# Patient Record
Sex: Female | Born: 1960 | Race: Black or African American | Hispanic: No | Marital: Married | Smoking: Never smoker
Health system: Southern US, Community
[De-identification: ages and names within clinical notes are randomized; demographics above are authoritative.]

## PROBLEM LIST (undated history)

## (undated) DIAGNOSIS — K5790 Diverticulosis of intestine, part unspecified, without perforation or abscess without bleeding: Secondary | ICD-10-CM

## (undated) DIAGNOSIS — M1990 Unspecified osteoarthritis, unspecified site: Secondary | ICD-10-CM

## (undated) DIAGNOSIS — K5792 Diverticulitis of intestine, part unspecified, without perforation or abscess without bleeding: Secondary | ICD-10-CM

## (undated) DIAGNOSIS — E282 Polycystic ovarian syndrome: Secondary | ICD-10-CM

## (undated) DIAGNOSIS — R7303 Prediabetes: Secondary | ICD-10-CM

## (undated) DIAGNOSIS — D259 Leiomyoma of uterus, unspecified: Secondary | ICD-10-CM

## (undated) DIAGNOSIS — E119 Type 2 diabetes mellitus without complications: Secondary | ICD-10-CM

## (undated) DIAGNOSIS — Z8719 Personal history of other diseases of the digestive system: Secondary | ICD-10-CM

## (undated) DIAGNOSIS — E039 Hypothyroidism, unspecified: Secondary | ICD-10-CM

## (undated) DIAGNOSIS — E079 Disorder of thyroid, unspecified: Secondary | ICD-10-CM

## (undated) DIAGNOSIS — K5909 Other constipation: Secondary | ICD-10-CM

## (undated) DIAGNOSIS — I10 Essential (primary) hypertension: Secondary | ICD-10-CM

## (undated) DIAGNOSIS — E785 Hyperlipidemia, unspecified: Secondary | ICD-10-CM

## (undated) HISTORY — DX: Unspecified osteoarthritis, unspecified site: M19.90

## (undated) HISTORY — DX: Leiomyoma of uterus, unspecified: D25.9

## (undated) HISTORY — DX: Personal history of other diseases of the digestive system: Z87.19

## (undated) HISTORY — DX: Prediabetes: R73.03

## (undated) HISTORY — PX: COLONOSCOPY: SHX174

## (undated) HISTORY — DX: Hypothyroidism, unspecified: E03.9

## (undated) HISTORY — DX: Disorder of thyroid, unspecified: E07.9

## (undated) HISTORY — PX: REFRACTIVE SURGERY: SHX103

## (undated) HISTORY — DX: Diverticulosis of intestine, part unspecified, without perforation or abscess without bleeding: K57.90

## (undated) HISTORY — PX: EYE SURGERY: SHX253

## (undated) HISTORY — DX: Other constipation: K59.09

## (undated) HISTORY — DX: Essential (primary) hypertension: I10

## (undated) HISTORY — PX: LASIK: SHX215

## (undated) HISTORY — DX: Diverticulitis of intestine, part unspecified, without perforation or abscess without bleeding: K57.92

## (undated) HISTORY — PX: BREAST SURGERY: SHX581

## (undated) HISTORY — PX: TONSILLECTOMY AND ADENOIDECTOMY: SUR1326

## (undated) HISTORY — DX: Type 2 diabetes mellitus without complications: E11.9

## (undated) HISTORY — PX: REDUCTION MAMMAPLASTY: SUR839

## (undated) HISTORY — DX: Hyperlipidemia, unspecified: E78.5

## (undated) HISTORY — DX: Polycystic ovarian syndrome: E28.2

---

## 1986-04-28 HISTORY — PX: BREAST REDUCTION SURGERY: SHX8

## 1993-04-28 HISTORY — PX: CHOLECYSTECTOMY: SHX55

## 1999-10-25 ENCOUNTER — Other Ambulatory Visit: Admission: RE | Admit: 1999-10-25 | Discharge: 1999-10-25 | Payer: Self-pay | Admitting: Obstetrics and Gynecology

## 2000-11-03 ENCOUNTER — Other Ambulatory Visit: Admission: RE | Admit: 2000-11-03 | Discharge: 2000-11-03 | Payer: Self-pay | Admitting: Obstetrics and Gynecology

## 2000-12-11 ENCOUNTER — Ambulatory Visit (HOSPITAL_COMMUNITY): Admission: RE | Admit: 2000-12-11 | Discharge: 2000-12-11 | Payer: Self-pay | Admitting: Obstetrics and Gynecology

## 2000-12-11 ENCOUNTER — Encounter: Payer: Self-pay | Admitting: Obstetrics and Gynecology

## 2001-11-24 ENCOUNTER — Other Ambulatory Visit: Admission: RE | Admit: 2001-11-24 | Discharge: 2001-11-24 | Payer: Self-pay | Admitting: Obstetrics and Gynecology

## 2002-02-18 ENCOUNTER — Encounter (INDEPENDENT_AMBULATORY_CARE_PROVIDER_SITE_OTHER): Payer: Self-pay

## 2002-02-18 ENCOUNTER — Ambulatory Visit (HOSPITAL_COMMUNITY): Admission: RE | Admit: 2002-02-18 | Discharge: 2002-02-18 | Payer: Self-pay | Admitting: Obstetrics and Gynecology

## 2003-04-12 ENCOUNTER — Other Ambulatory Visit: Admission: RE | Admit: 2003-04-12 | Discharge: 2003-04-12 | Payer: Self-pay | Admitting: Obstetrics and Gynecology

## 2004-04-11 ENCOUNTER — Encounter: Admission: RE | Admit: 2004-04-11 | Discharge: 2004-04-11 | Payer: Self-pay | Admitting: Obstetrics and Gynecology

## 2004-07-05 ENCOUNTER — Ambulatory Visit (HOSPITAL_COMMUNITY): Admission: RE | Admit: 2004-07-05 | Discharge: 2004-07-05 | Payer: Self-pay | Admitting: Internal Medicine

## 2005-08-21 ENCOUNTER — Encounter: Admission: RE | Admit: 2005-08-21 | Discharge: 2005-08-21 | Payer: Self-pay | Admitting: Obstetrics and Gynecology

## 2006-04-15 ENCOUNTER — Ambulatory Visit: Payer: Self-pay

## 2006-08-24 ENCOUNTER — Encounter: Admission: RE | Admit: 2006-08-24 | Discharge: 2006-08-24 | Payer: Self-pay | Admitting: Obstetrics and Gynecology

## 2007-08-17 ENCOUNTER — Ambulatory Visit (HOSPITAL_COMMUNITY): Admission: RE | Admit: 2007-08-17 | Discharge: 2007-08-17 | Payer: Self-pay | Admitting: General Surgery

## 2007-09-22 ENCOUNTER — Encounter: Admission: RE | Admit: 2007-09-22 | Discharge: 2007-09-22 | Payer: Self-pay | Admitting: Obstetrics and Gynecology

## 2007-09-28 ENCOUNTER — Encounter: Admission: RE | Admit: 2007-09-28 | Discharge: 2007-09-28 | Payer: Self-pay | Admitting: General Surgery

## 2008-09-22 ENCOUNTER — Encounter: Admission: RE | Admit: 2008-09-22 | Discharge: 2008-09-22 | Payer: Self-pay | Admitting: Obstetrics and Gynecology

## 2010-01-31 ENCOUNTER — Encounter: Admission: RE | Admit: 2010-01-31 | Discharge: 2010-01-31 | Payer: Self-pay | Admitting: Obstetrics and Gynecology

## 2010-09-13 NOTE — Op Note (Signed)
   NAME:  Kelly Goodwin, Kelly Goodwin                          ACCOUNT NO.:  [id number]   MEDICAL RECORD NO.:  [id number]                   PATIENT TYPE:  AMB   LOCATION:  SDC                                  FACILITY:  [facility]   PHYSICIAN:  Maxie Better, MD              DATE OF BIRTH:  [birth year]   DATE OF PROCEDURE:  02/18/2002  DATE OF DISCHARGE:                                 OPERATIVE REPORT   PREOPERATIVE DIAGNOSES:  Missed abortion with adjacent cystic mass.   PROCEDURE:  Suction dilatation and evacuation.   POSTOPERATIVE DIAGNOSES:  Missed abortion with adjacent cystic mass.   ANESTHESIA:  MAC, paracervical block.   SURGEON:  Maxie Better, MD   INDICATIONS:  This is Goodwin 50 year old gravida 2, para 0 female at 7-1/[redacted] weeks  gestation who was found on ultrasound yesterday to have Goodwin fetal demise with  an associated cystic mass adjacent to the fetus of unclear etiology.  Risks  and benefits of the procedure planned today was reviewed with the patient.  Consent was signed.  The patient was transferred to the operating room.   PROCEDURE:  Under adequate monitored anesthesia, the patient was placed in  the dorsal lithotomy position.  She was thoroughly prepped and draped in the  usual fashion.  The bladder was catheterized for Goodwin small amount of urine.  Examination under anesthesia revealed an 8+ week sized uterus anteflexed  with no adnexal masses palpable.  Goodwin bivalve speculum was placed in the  vagina.  20 cc of 1% Nesacaine was injected paracervically at 3 and 9  o'clock.  The anterior lip of the cervix was subsequently grasped with Goodwin  single tooth tenaculum.  The cervix was serially dilated up to number 31  Pratt dilator and Goodwin number 9 mm suction curved cannula was introduced into  the uterine cavity without incident.  The cavity was suctioned and  subsequently curetted and then suctioned again.  Goodwin lot of tissue was felt to  have been removed.  All instruments were then removed from  the vagina.  Goodwin  portion of the specimen was sent for karyotyping.   SPECIMENS:  Specimen labeled products of conception was sent to pathology.   ESTIMATED BLOOD LOSS:  Minimal.   COMPLICATIONS:  None.   DISPOSITION:  The patient tolerated the procedure well.  Was transferred to  recovery room in stable condition.                                               Maxie Better, MD    [place]/MEDQ  D:  02/18/2002  T:  02/18/2002  Job:  [id number]

## 2011-01-21 ENCOUNTER — Ambulatory Visit (HOSPITAL_COMMUNITY)
Admission: RE | Admit: 2011-01-21 | Discharge: 2011-01-21 | Disposition: A | Discharge status: Home / Self Care | Payer: 59 | Source: Ambulatory Visit | Attending: Internal Medicine | Admitting: Internal Medicine

## 2011-01-21 ENCOUNTER — Other Ambulatory Visit (HOSPITAL_COMMUNITY): Payer: Self-pay | Admitting: Internal Medicine

## 2011-01-21 DIAGNOSIS — M545 Low back pain, unspecified: Secondary | ICD-10-CM | POA: Insufficient documentation

## 2011-01-21 DIAGNOSIS — M25559 Pain in unspecified hip: Secondary | ICD-10-CM | POA: Insufficient documentation

## 2011-03-03 ENCOUNTER — Other Ambulatory Visit: Payer: Self-pay | Admitting: Obstetrics and Gynecology

## 2011-03-03 DIAGNOSIS — Z1231 Encounter for screening mammogram for malignant neoplasm of breast: Secondary | ICD-10-CM

## 2011-03-07 ENCOUNTER — Ambulatory Visit
Admission: RE | Admit: 2011-03-07 | Discharge: 2011-03-07 | Disposition: A | Discharge status: Home / Self Care | Payer: 59 | Source: Ambulatory Visit | Attending: Obstetrics and Gynecology | Admitting: Obstetrics and Gynecology

## 2011-03-07 DIAGNOSIS — Z1231 Encounter for screening mammogram for malignant neoplasm of breast: Secondary | ICD-10-CM

## 2011-03-24 ENCOUNTER — Encounter: Payer: Self-pay | Admitting: Gastroenterology

## 2011-03-24 ENCOUNTER — Ambulatory Visit: Payer: 59

## 2011-03-25 ENCOUNTER — Ambulatory Visit (AMBULATORY_SURGERY_CENTER): Payer: 59 | Admitting: *Deleted

## 2011-03-25 ENCOUNTER — Telehealth: Payer: Self-pay | Admitting: *Deleted

## 2011-03-25 VITALS — Ht 60.0 in | Wt 173.0 lb

## 2011-03-25 DIAGNOSIS — Z1211 Encounter for screening for malignant neoplasm of colon: Secondary | ICD-10-CM

## 2011-03-25 MED ORDER — PEG-KCL-NACL-NASULF-NA ASC-C 100 G PO SOLR: ORAL | Status: DC

## 2011-03-25 NOTE — Telephone Encounter (Signed)
Your plan is excellent, 2 days of clear liquids plus Mag citrate on Day#1

## 2011-03-25 NOTE — Telephone Encounter (Signed)
Left message on pts. Home number to be sure to take the mag citrate on day one of clear liquids as instructed/Dr. Juanda Chance.

## 2011-03-25 NOTE — Telephone Encounter (Signed)
Dr. Juanda Chance, Ms. Register has chronic constipation and says she uses Mag Citrate to have BM's and that sometimes even a bottle of that doesn't work.  She is scheduled for a colon on 04/03/11.   I gave her a 2 day clear liquid prep and the Moviprep and advised her to take a bottle of mag citrate on day 1 of the clear liquids.  Please advise if I need to add to her prep.  Thank you. Wyona Almas

## 2011-04-03 ENCOUNTER — Encounter: Payer: 59 | Admitting: Internal Medicine

## 2011-04-14 ENCOUNTER — Encounter: Payer: 59 | Admitting: Gastroenterology

## 2011-04-16 ENCOUNTER — Ambulatory Visit (AMBULATORY_SURGERY_CENTER): Payer: 59 | Admitting: Internal Medicine

## 2011-04-16 ENCOUNTER — Encounter: Payer: Self-pay | Admitting: Internal Medicine

## 2011-04-16 VITALS — BP 139/72 | HR 81 | Temp 98.4°F | Resp 16 | Ht 60.0 in | Wt 173.0 lb

## 2011-04-16 DIAGNOSIS — Z1211 Encounter for screening for malignant neoplasm of colon: Secondary | ICD-10-CM

## 2011-04-16 MED ORDER — SODIUM CHLORIDE 0.9 % IV SOLN: 500.0000 mL | INTRAVENOUS | Status: DC

## 2011-04-16 NOTE — Patient Instructions (Signed)
Please refer to your blue and neon green sheets for instructions regarding diet and activity for the rest of today.  You may resume your medications as you would normally take them.   Diverticulosis Diverticulosis is a common condition that develops when small pouches (diverticula) form in the wall of the colon. The risk of diverticulosis increases with age. It happens more often in people who eat a low-fiber diet. Most individuals with diverticulosis have no symptoms. Those individuals with symptoms usually experience abdominal pain, constipation, or loose stools (diarrhea). HOME CARE INSTRUCTIONS   Increase the amount of fiber in your diet as directed by your caregiver or dietician. This may reduce symptoms of diverticulosis.   Your caregiver may recommend taking a dietary fiber supplement.   Drink at least 6 to 8 glasses of water each day to prevent constipation.   Try not to strain when you have a bowel movement.   Your caregiver may recommend avoiding nuts and seeds to prevent complications, although this is still an uncertain benefit.   Only take over-the-counter or prescription medicines for pain, discomfort, or fever as directed by your caregiver.  FOODS WITH HIGH FIBER CONTENT INCLUDE:  Fruits. Apple, peach, pear, tangerine, raisins, prunes.   Vegetables. Brussels sprouts, asparagus, broccoli, cabbage, carrot, cauliflower, romaine lettuce, spinach, summer squash, tomato, winter squash, zucchini.   Starchy Vegetables. Baked beans, kidney beans, lima beans, split peas, lentils, potatoes (with skin).   Grains. Whole wheat bread, brown rice, bran flake cereal, plain oatmeal, white rice, shredded wheat, bran muffins.  SEEK IMMEDIATE MEDICAL CARE IF:   You develop increasing pain or severe bloating.   You have an oral temperature above 102 F (38.9 C), not controlled by medicine.   You develop vomiting or bowel movements that are bloody or black.  Document Released: 01/10/2004  Document Revised: 12/25/2010 Document Reviewed: 09/12/2009 [employer] Patient Information 2012 [employer]. 

## 2011-04-16 NOTE — Progress Notes (Signed)
Patient did not experience any of the following events: a burn prior to discharge; a fall within the facility; wrong site/side/patient/procedure/implant event; or a hospital transfer or hospital admission upon discharge from the facility. (G8907) Patient did not have preoperative order for IV antibiotic SSI prophylaxis. (G8918)  

## 2011-04-16 NOTE — Op Note (Signed)
[facility] [address] [place], [place]  [address]  COLONOSCOPY PROCEDURE REPORT  PATIENT:  Kelly, Goodwin  MR#:  [id number] BIRTHDATE:  Feb 02, 1961, 50 yrs. old  GENDER:  female ENDOSCOPIST:  Hedwig Morton. Juanda Chance, MD REF. BY:  Marisue Brooklyn, D.O. PROCEDURE DATE:  04/16/2011 PROCEDURE:  Colonoscopy 19147 ASA CLASS:  Class I INDICATIONS:  colorectal cancer screening, average risk MEDICATIONS:   MAC sedation, administered by CRNA, propofol (Diprivan) 320 mcg  DESCRIPTION OF PROCEDURE:   After the risks and benefits and of the procedure were explained, informed consent was obtained. Digital rectal exam was performed and revealed no rectal masses. The LB [id number]-[id number] endoscope was introduced through the anus and advanced to the cecum, which was identified by both the appendix and ileocecal valve.  The quality of the prep was excellent, using MoviPrep.  The instrument was then slowly withdrawn as the colon was fully examined. <<PROCEDUREIMAGES>>  FINDINGS:  Mild diverticulosis was found in the sigmoid colon (see image1).  This was otherwise a normal examination of the colon (see image2, image3, image4, image5, image6, image7, and image8). Retroflexed views in the rectum revealed no abnormalities.    The scope was then withdrawn from the patient and the procedure completed.  COMPLICATIONS:  None ENDOSCOPIC IMPRESSION: 1) Mild diverticulosis in the sigmoid colon 2) Otherwise normal examination RECOMMENDATIONS: 1) High fiber diet.  REPEAT EXAM:  In 10 year(s) for.  ______________________________ Hedwig Morton. Juanda Chance, MD  CC:  n. eSIGNED:   Hedwig Morton. Yenni Carra at 04/16/2011 08:39 AM  Learta Codding, [phone]

## 2011-04-17 ENCOUNTER — Telehealth: Payer: Self-pay | Admitting: *Deleted

## 2011-04-17 NOTE — Telephone Encounter (Signed)
Left message, no answer.

## 2011-06-04 ENCOUNTER — Telehealth: Payer: Self-pay | Admitting: Internal Medicine

## 2011-06-04 NOTE — Telephone Encounter (Signed)
I agree with pt seeing Kelly Goodwin. 

## 2011-06-04 NOTE — Telephone Encounter (Signed)
Patient calling to report on 05/25/11, she went to [facility] in [facility] for abdominal cramping. They sent her to ED in [place]. She was given IVF, had a CT scan and was told it was diverticulitis. She was sent home with antibiotics and pain medication. She was told to f/u with her GI in 3 weeks. Today is her last day of antibiotics and she is having some abdominal pain again. States it is mild but it concerns her. She will get the records from [place] ED faxed to [place] or pick them  Up herself. Scheduled her with Mike Gip, PA on 06/05/11 at 2:00 PM. Last colon 04/16/11- mild diverticulosis in sigmoid colon.

## 2011-06-05 ENCOUNTER — Encounter: Payer: Self-pay | Admitting: Physician Assistant

## 2011-06-05 ENCOUNTER — Ambulatory Visit (INDEPENDENT_AMBULATORY_CARE_PROVIDER_SITE_OTHER): Payer: 59 | Admitting: Physician Assistant

## 2011-06-05 VITALS — BP 150/80 | HR 75 | Ht 60.0 in

## 2011-06-05 DIAGNOSIS — K5730 Diverticulosis of large intestine without perforation or abscess without bleeding: Secondary | ICD-10-CM

## 2011-06-05 DIAGNOSIS — E119 Type 2 diabetes mellitus without complications: Secondary | ICD-10-CM

## 2011-06-05 DIAGNOSIS — K5732 Diverticulitis of large intestine without perforation or abscess without bleeding: Secondary | ICD-10-CM

## 2011-06-05 DIAGNOSIS — I10 Essential (primary) hypertension: Secondary | ICD-10-CM

## 2011-06-05 DIAGNOSIS — E039 Hypothyroidism, unspecified: Secondary | ICD-10-CM

## 2011-06-05 DIAGNOSIS — K5792 Diverticulitis of intestine, part unspecified, without perforation or abscess without bleeding: Secondary | ICD-10-CM

## 2011-06-05 DIAGNOSIS — K5790 Diverticulosis of intestine, part unspecified, without perforation or abscess without bleeding: Secondary | ICD-10-CM | POA: Insufficient documentation

## 2011-06-05 MED ORDER — CIPROFLOXACIN HCL 500 MG PO TABS: 500.0000 mg | ORAL_TABLET | Freq: Two times a day (BID) | ORAL | Status: AC

## 2011-06-05 MED ORDER — FLUCONAZOLE 100 MG PO TABS: 100.0000 mg | ORAL_TABLET | Freq: Once | ORAL | Status: AC

## 2011-06-05 MED ORDER — METRONIDAZOLE 500 MG PO TABS: 500.0000 mg | ORAL_TABLET | Freq: Two times a day (BID) | ORAL | Status: AC

## 2011-06-05 NOTE — Patient Instructions (Addendum)
We have sent 7 more days of the Cipro and Flagyl. We also sent prescription and refill for Diflucan. Call back if the pain is not resolved.

## 2011-06-05 NOTE — Progress Notes (Signed)
Subjective:    Patient ID: Kelly Goodwin, female    DOB: 1960/07/16, 51 y.o.   MRN: [id number]  HPI Kelly Goodwin is a very nice 51 year old female recently known to Dr. Lina Sar from screening colonoscopy done in December of 2012. She was found to have mild diverticulosis in the sigmoid colon and otherwise a normal exam. Patient says she developed left lower quadrant abdominal pain towards the end of January,which progressed over a few days. She was eventually seen at an urgent care and then had CT scan of the abdomen and pelvis done through [facility] emergency room. She brought  a copy of that with her today and the CT scan shows acute descending and sigmoid colon diverticulitis without perforation or abscess. Labs at that time showed a WBC of 14.6, hemoglobin 12.4, hematocrit 38.4.  She was treated with a 7 day course of Flagyl and a 10 day course of Cipro 500 twice daily. She says she feels a lot better than she did however after she completed her antibiotics yesterday she was still having just very mild persistent left lower quadrant pain and therefore made the appointment here. She has not had any fever or chills no nausea or vomiting. Her bowel movements are regular though she says her stools are somewhat loose she has not noted any blood. She says she just has a dull persistent left lower quadrant discomfort.  , She is concerned about the diet because she was eating a lot of popcorn recently trying to lose weight and wonders if that is okay  Review of Systems  HENT: Negative.   Respiratory: Negative.   Cardiovascular: Negative.   Gastrointestinal: Positive for abdominal pain.  Genitourinary: Negative.   Musculoskeletal: Negative.   Neurological: Negative.   Hematological: Negative.   Psychiatric/Behavioral: Negative.    Outpatient Prescriptions Prior to Visit  Medication Sig Dispense Refill  . BIOTIN PO Take 1 tablet by mouth at bedtime.        Marland Kitchen BLACK COHOSH PO Take 1 tablet by mouth  daily.        . Cholecalciferol (VITAMIN D) 2000 UNITS tablet Take 2,000 Units by mouth 2 (two) times daily.        Marland Kitchen GARLIC PO Take 1 tablet by mouth 2 (two) times daily.        Marland Kitchen levothyroxine (SYNTHROID, LEVOTHROID) 100 MCG tablet Take 100 mcg by mouth daily.        Marland Kitchen losartan-hydrochlorothiazide (HYZAAR) 100-25 MG per tablet Take 1 tablet by mouth daily.        Marland Kitchen LYSINE PO Take 1 tablet by mouth at bedtime.        . metFORMIN (GLUCOPHAGE) 1000 MG tablet Take 1,000 mg by mouth 2 (two) times daily with a meal.        . Multiple Vitamins-Minerals (MULTIVITAMIN WITH MINERALS) tablet Take 1 tablet by mouth daily.        . temazepam (RESTORIL) 30 MG capsule Take 30 mg by mouth at bedtime as needed.        . valACYclovir (VALTREX) 500 MG tablet Take 500 mg by mouth 2 (two) times daily as needed.        . cloNIDine HCl (KAPVAY) 0.1 MG TB12 ER tablet Take 0.1 mg by mouth 2 (two) times daily.        Marland Kitchen EVENING PRIMROSE OIL PO Take 1 tablet by mouth at bedtime.       Marland Kitchen lubiprostone (AMITIZA) 8 MCG capsule Take 8 mcg by mouth  2 (two) times daily with a meal.        . peg 3350 powder (MOVIPREP) 100 G SOLR MOVI PREP take as directed  1 kit  0   No Known Allergies     Active Ambulatory Problems    Diagnosis Date Noted  . Diverticulosis 06/05/2011  . Diverticulitis large intestine 06/05/2011  . Hypothyroid 06/05/2011  . HTN (hypertension) 06/05/2011  . Diabetes mellitus 06/05/2011   Resolved Ambulatory Problems    Diagnosis Date Noted  . No Resolved Ambulatory Problems   Past Medical History  Diagnosis Date  . Arthritis   . Constipation, chronic   . PCOS (polycystic ovarian syndrome)   . Hypertension   . Thyroid disease   . Fibroid uterus   . Diverticulitis   . Migraine   . History of gallstones    Objective:   Physical Exam well-developed white female in no acute distress, pleasant alert and oriented x3 blood pressure 150/80, pulse 75. HEENT;; nonicteric normocephalic EOMI PERRLA  sclera anicteric,Neck; Supple no JVD, Cardiovascular; regular rate and rhythm with S1-S2 no murmur gallop, Pulmonary; clear bilaterally, Abdomen soft ,,nondistended bowel sounds are active, she is mildly tender in the left lower quadrant there is no guarding no rebound no palpable mass or hepatosplenomegaly, Rectal; not done, Extremities;,. no clubbing cyanosis or edema, Psych; mood and affect normal and appropriate        Assessment & Plan:  #43 51 year old female with first episode of sigmoid diverticulitis, uncomplicated-with mild persistent left lower quadrant discomfort after a 10 day course of antibiotics. I suspect she has not completely resolved her diverticulitis.  Plan; Will treat her with another seven-day course of Cipro 500 mg by mouth twice daily, and Flagyl 500 mg by mouth twice daily. Diflucan 100 mg as needed for vaginal candidiasis. Discussed role of diet in diverticular disease and advised she  avoid popcorn and nuts for now. She is asked to call if she has any residual pain after she completes the additional course of antibiotics otherwise will follow up with Dr. Lina Sar  on an as-needed basis.

## 2011-06-05 NOTE — Progress Notes (Signed)
Agree with initial assessment and plans as outlined 

## 2011-11-28 ENCOUNTER — Other Ambulatory Visit (HOSPITAL_COMMUNITY): Payer: Self-pay | Admitting: Physician Assistant

## 2011-11-28 ENCOUNTER — Ambulatory Visit (HOSPITAL_COMMUNITY)
Admission: RE | Admit: 2011-11-28 | Discharge: 2011-11-28 | Disposition: A | Discharge status: Home / Self Care | Payer: 59 | Source: Ambulatory Visit | Attending: Physician Assistant | Admitting: Physician Assistant

## 2011-11-28 DIAGNOSIS — R072 Precordial pain: Secondary | ICD-10-CM

## 2011-11-28 DIAGNOSIS — R0602 Shortness of breath: Secondary | ICD-10-CM | POA: Insufficient documentation

## 2011-11-28 DIAGNOSIS — M549 Dorsalgia, unspecified: Secondary | ICD-10-CM | POA: Insufficient documentation

## 2012-01-22 ENCOUNTER — Other Ambulatory Visit: Payer: Self-pay | Admitting: Internal Medicine

## 2012-01-22 DIAGNOSIS — R1310 Dysphagia, unspecified: Secondary | ICD-10-CM

## 2012-01-23 ENCOUNTER — Ambulatory Visit
Admission: RE | Admit: 2012-01-23 | Discharge: 2012-01-23 | Disposition: A | Discharge status: Home / Self Care | Payer: 59 | Source: Ambulatory Visit | Attending: Internal Medicine | Admitting: Internal Medicine

## 2012-01-23 DIAGNOSIS — R1310 Dysphagia, unspecified: Secondary | ICD-10-CM

## 2012-01-23 MED ORDER — IOHEXOL 300 MG/ML  SOLN
75.0000 mL | Freq: Once | INTRAMUSCULAR | Status: AC | PRN
  Administered 2012-01-23: 75 mL via INTRAVENOUS

## 2012-02-09 ENCOUNTER — Encounter: Payer: Self-pay | Admitting: Physician Assistant

## 2012-02-09 ENCOUNTER — Telehealth: Payer: Self-pay | Admitting: Physician Assistant

## 2012-02-09 ENCOUNTER — Telehealth: Payer: Self-pay | Admitting: Internal Medicine

## 2012-02-09 ENCOUNTER — Ambulatory Visit (INDEPENDENT_AMBULATORY_CARE_PROVIDER_SITE_OTHER): Payer: 59 | Admitting: Physician Assistant

## 2012-02-09 VITALS — BP 150/80 | HR 96 | Ht 61.0 in | Wt 175.4 lb

## 2012-02-09 DIAGNOSIS — K5792 Diverticulitis of intestine, part unspecified, without perforation or abscess without bleeding: Secondary | ICD-10-CM

## 2012-02-09 DIAGNOSIS — K5909 Other constipation: Secondary | ICD-10-CM

## 2012-02-09 DIAGNOSIS — K5900 Constipation, unspecified: Secondary | ICD-10-CM

## 2012-02-09 DIAGNOSIS — K5732 Diverticulitis of large intestine without perforation or abscess without bleeding: Secondary | ICD-10-CM

## 2012-02-09 MED ORDER — TRAMADOL HCL 50 MG PO TABS: 50.0000 mg | ORAL_TABLET | Freq: Four times a day (QID) | ORAL | Status: DC | PRN

## 2012-02-09 MED ORDER — LINACLOTIDE 145 MCG PO CAPS: 145.0000 ug | ORAL_CAPSULE | Freq: Every day | ORAL | Status: DC

## 2012-02-09 MED ORDER — AMOXICILLIN-POT CLAVULANATE 875-125 MG PO TABS: 1.0000 | ORAL_TABLET | Freq: Two times a day (BID) | ORAL | Status: DC

## 2012-02-09 NOTE — Progress Notes (Signed)
Reviewed and agree.

## 2012-02-09 NOTE — Patient Instructions (Addendum)
We have sent the following medications to your pharmacy for you to pick up at your convenience: Augmentin and Ultram   We have given you samples of the following medication to take: Lizness. Please take one by mouth once daily. If this medication works for you please call back and we can send in a prescription

## 2012-02-09 NOTE — Telephone Encounter (Signed)
Patient states she gets yeast infections with antibiotics and is asking for rx with one refill on it for this. Please, advise.

## 2012-02-09 NOTE — Progress Notes (Signed)
Subjective:    Patient ID: Kelly Goodwin, female    DOB: [birth year], 51 y.o.   MRN: [id number]  HPI Kelly Goodwin is a pleasant 51 year old female known to Dr. Juanda Chance with history of diverticular disease. She underwent colonoscopy in December of 2012 which did show mild diverticulosis in the sigmoid colon and otherwise was a negative exam. She was seen here in February of 2013 with left lower quadrant pain and was treated for diverticulitis. This was her first episode. He says she's been doing fine in the interim from a GI standpoint with the exception of her chronic constipation which she has had for years area she has tried multiple different laxatives and at this point is using a fiber supplement plus MiraLax every evening) use and still only has a bowel movement about once a week. She had onset about 8 days ago with lower abdominal discomfort and bloating- however she felt better the following day and then this past weekend developed left lower cautery pain which has now been persistent over the past 24 hours. She says it feels like her diverticulitis did previously. She's not had any fever or chills, no nausea or vomiting, no melena or hematochezia. She is concerned because she supposed to go out of town later this week for a short vacation    Review of Systems  Constitutional: Positive for appetite change.  HENT: Negative.   Eyes: Negative.   Respiratory: Positive for cough.   Cardiovascular: Negative.   Gastrointestinal: Positive for abdominal pain and constipation.  Genitourinary: Negative.   Musculoskeletal: Negative.   Skin: Negative.   Neurological: Negative.   Hematological: Negative.   Psychiatric/Behavioral: Negative.    Outpatient Prescriptions Prior to Visit  Medication Sig Dispense Refill  . BIOTIN PO Take 1 tablet by mouth at bedtime.        Marland Kitchen BLACK COHOSH PO Take 1 tablet by mouth daily.        . Cholecalciferol (VITAMIN D) 2000 UNITS tablet Take 2,000 Units by mouth 2 (two)  times daily.        Marland Kitchen GARLIC PO Take 1 tablet by mouth 2 (two) times daily.        Marland Kitchen levothyroxine (SYNTHROID, LEVOTHROID) 100 MCG tablet Take 100 mcg by mouth daily.        Marland Kitchen losartan-hydrochlorothiazide (HYZAAR) 100-25 MG per tablet Take 1 tablet by mouth daily.        Marland Kitchen LYSINE PO Take 1 tablet by mouth at bedtime.        . metFORMIN (GLUCOPHAGE) 1000 MG tablet Take 1,000 mg by mouth 2 (two) times daily with a meal.        . Multiple Vitamins-Minerals (MULTIVITAMIN WITH MINERALS) tablet Take 1 tablet by mouth daily.        . temazepam (RESTORIL) 30 MG capsule Take 30 mg by mouth at bedtime as needed.        . valACYclovir (VALTREX) 500 MG tablet Take 500 mg by mouth 2 (two) times daily as needed.         No Known Allergies Patient Active Problem List  Diagnosis  . Diverticulosis  . Diverticulitis large intestine  . Hypothyroid  . HTN (hypertension)  . Diabetes mellitus   History   Social History  . Marital Status: Married    Spouse Name: N/A    Number of Children: 0  . Years of Education: N/A   Occupational History  . 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  Smoking status: Never Smoker   . Smokeless tobacco: Never Used  . Alcohol Use: Yes     rarely  . Drug Use: No  . Sexually Active: Not on file   Other Topics Concern  . Not on file   Social History Narrative   Caffeine use 2 a week       Objective:   Physical Exam well-developed African American female in no acute distress, pleasant. Blood pressure 150/80 pulse 96 height 5 foot 1 weight 175. HEENT; nontraumatic normocephalic EOMI PERRLA sclera anicteric,Neck; Supple no JVD, Cardiovascular; regular rate and rhythm with S1-S2 no murmur or gallop, Pulmonary; clear bilaterally, Abdomen; soft bowel sounds are present she is tender in the left lower quadrant, no guarding or rebound no palpable mass or hepatosplenomegaly, Rectal; exam not done, Extremities; no clubbing cyanosis or edema skin warm and dry,  Psych; mood and affect normal and appropriate        Assessment & Plan:  #13  51 year old female with recurrent sigmoid diverticulitis #2 chronic constipation  Plan; start Augmentin 875 mg by mouth twice daily x14 days Ultram 50 mg one every 4-6 hours as needed for pain over the next few days #20 no refills Will also give her a trial of Linzess 145 mcg one by mouth daily or chronic constipation, samples were given today and if she finds these helpful she will call back for a prescription. She had tried Amitiza in the past but developed alopecia Patient cautioned to call back if she is not significantly per improved within the next 72 hours or at any point if her symptoms worsen. I also asked her to call if she has a residual discomfort after she finishes the antibiotics. Follow up with Dr. Juanda Chance as needed

## 2012-02-09 NOTE — Telephone Encounter (Signed)
Patient states she is having sharp abdominal pain and cramping that started this weekend. States her stomach is hard and she feels bloated. Hx diverticulitis and patient states it is very similar to this type of pain. Scheduled her at 9:30 AM with Mike Gip, PA

## 2012-02-09 NOTE — Progress Notes (Signed)
Agree with Ms. Esterwood's assessment and plan. Artha Stavros E. Zoya Sprecher, MD, FACG   

## 2012-02-10 MED ORDER — FLUCONAZOLE 150 MG PO TABS: 150.0000 mg | ORAL_TABLET | Freq: Once | ORAL | Status: DC

## 2012-02-10 NOTE — Telephone Encounter (Signed)
Spoke with patient and she has taken Diflucan in the past without problems. Rx sent.

## 2012-02-10 NOTE — Telephone Encounter (Signed)
If she has taken Diflucan before and tolerated- can give her Rx for Diflucan 150 mg   One dose-  -dispense #2  One refill

## 2012-02-19 ENCOUNTER — Telehealth: Payer: Self-pay | Admitting: Internal Medicine

## 2012-02-20 MED ORDER — LINACLOTIDE 145 MCG PO CAPS: 145.0000 ug | ORAL_CAPSULE | Freq: Every day | ORAL | Status: DC

## 2012-02-20 NOTE — Telephone Encounter (Signed)
Per Gunnar Fusi patient needs to start taking antibiotic twice daily  Linzess called into patients pharmacy   Left message on patients voice mail to call office back

## 2012-02-23 ENCOUNTER — Other Ambulatory Visit: Payer: Self-pay | Admitting: Obstetrics and Gynecology

## 2012-02-23 DIAGNOSIS — Z1231 Encounter for screening mammogram for malignant neoplasm of breast: Secondary | ICD-10-CM

## 2012-02-24 ENCOUNTER — Telehealth: Payer: Self-pay | Admitting: *Deleted

## 2012-02-24 NOTE — Telephone Encounter (Signed)
Patient did try Amitiza did not work.  Patient really liked Linzess but not covered by insurance. I advised patient that we will do a prior authorization to see if she can get approved for Linzess.  Kelly Goodwin will look over her chart as well.  Told patient I will leave samples of Linzess up front to help her through until we can get prior authorization or find an alternative

## 2012-02-24 NOTE — Telephone Encounter (Signed)
Called [facility] at [phone] and spoke with Herbert Seta. Patient was approved for Linzess until August 14, 2012. Authorization code is [id number] Faxed letter back to [facility] that it was approved and approval code I called patient and advised that [name] was approved and available for pick up.. Patient verbalized understanding

## 2012-02-24 NOTE — Telephone Encounter (Signed)
Agree,  Laxatives in the pastwould try to get pre auth for Linzess as she has been on multiple laxatives in the past, and could not tolerate Amitiza due to alopecia.

## 2012-03-17 ENCOUNTER — Telehealth: Payer: Self-pay | Admitting: Physician Assistant

## 2012-03-17 NOTE — Telephone Encounter (Signed)
Please add Colace 100mg  2 tabs po qhs, it is OTC

## 2012-03-17 NOTE — Telephone Encounter (Signed)
Patient saw Mike Gip, PA last month for diverticulitis and chronic constipation. She was placed on antibiotics for 14 days and Linzess 145 mcg daily. The Linzess worked great while she was on the antibiotics but after she finished them it stopped working. She reports she only has hard, marble size stool 1-2 times/week. She is wondering about increasing to the higher dose of Linzess. Please, advise.

## 2012-03-18 NOTE — Telephone Encounter (Signed)
Spoke with patient and gave her Dr. Brodie's recommendation.  

## 2012-04-02 ENCOUNTER — Ambulatory Visit
Admission: RE | Admit: 2012-04-02 | Discharge: 2012-04-02 | Disposition: A | Discharge status: Home / Self Care | Payer: 59 | Source: Ambulatory Visit | Attending: Obstetrics and Gynecology | Admitting: Obstetrics and Gynecology

## 2012-04-02 DIAGNOSIS — Z1231 Encounter for screening mammogram for malignant neoplasm of breast: Secondary | ICD-10-CM

## 2012-04-05 ENCOUNTER — Other Ambulatory Visit: Payer: Self-pay | Admitting: Obstetrics and Gynecology

## 2012-04-05 DIAGNOSIS — Z780 Asymptomatic menopausal state: Secondary | ICD-10-CM

## 2012-04-27 ENCOUNTER — Ambulatory Visit
Admission: RE | Admit: 2012-04-27 | Discharge: 2012-04-27 | Disposition: A | Discharge status: Home / Self Care | Payer: 59 | Source: Ambulatory Visit | Attending: Obstetrics and Gynecology | Admitting: Obstetrics and Gynecology

## 2012-04-27 DIAGNOSIS — Z780 Asymptomatic menopausal state: Secondary | ICD-10-CM

## 2012-05-10 ENCOUNTER — Telehealth: Payer: Self-pay | Admitting: Internal Medicine

## 2012-05-10 ENCOUNTER — Ambulatory Visit (INDEPENDENT_AMBULATORY_CARE_PROVIDER_SITE_OTHER): Payer: 59 | Admitting: Physician Assistant

## 2012-05-10 ENCOUNTER — Encounter: Payer: Self-pay | Admitting: Physician Assistant

## 2012-05-10 VITALS — BP 120/70 | HR 120 | Ht 60.0 in | Wt 184.8 lb

## 2012-05-10 DIAGNOSIS — K5732 Diverticulitis of large intestine without perforation or abscess without bleeding: Secondary | ICD-10-CM

## 2012-05-10 DIAGNOSIS — K5792 Diverticulitis of intestine, part unspecified, without perforation or abscess without bleeding: Secondary | ICD-10-CM

## 2012-05-10 MED ORDER — AMOXICILLIN-POT CLAVULANATE 875-125 MG PO TABS: 1.0000 | ORAL_TABLET | Freq: Two times a day (BID) | ORAL | Status: DC

## 2012-05-10 MED ORDER — HYDROCODONE-ACETAMINOPHEN 5-500 MG PO TABS: ORAL_TABLET | ORAL | Status: DC

## 2012-05-10 MED ORDER — LINACLOTIDE 290 MCG PO CAPS: 1.0000 | ORAL_CAPSULE | Freq: Every day | ORAL | Status: DC

## 2012-05-10 MED ORDER — FLUCONAZOLE 150 MG PO TABS: 150.0000 mg | ORAL_TABLET | Freq: Once | ORAL | Status: DC

## 2012-05-10 NOTE — Telephone Encounter (Signed)
Bad cramping in right side that started on Friday. Felt a little better yesterday but continues to have cramping today. Denies diarrhea, constipation and she does not think she has a fever.Scheduled with Mike Gip, PA today at 3:00 PM.

## 2012-05-10 NOTE — Patient Instructions (Addendum)
We have given you a note for work. You may return to work on Thursday, 05-13-2012. We sent prescriptions for Augmentin, Vicodin, Diflucan, and samples of Linzess.  Pharmacy [facility] [address]. Linzess 290 mcg samples, take as needed for constipation. Let us know if this helps.

## 2012-05-10 NOTE — Progress Notes (Signed)
Reviewed and agree with treating as diverticulitis. If no improvement, consider CT scan

## 2012-05-10 NOTE — Progress Notes (Signed)
Subjective:    Patient ID: Kelly Goodwin, female    DOB: [birth year], 52 y.o.   MRN: [id number]  HPI  Kelly Goodwin is a very nice 53 year old female known to Dr. Lina Sar who has history of diverticular disease and has had a couple of episodes of diverticulitis. She also has chronic constipation, hypothyroidism and hypertension. She was last seen in October of 2013 with an episode of diverticulitis which responded to a course of Augmentin. She comes in today with onset of recurrent left lower quadrant pain over the past 3 days which has been fairly constant. She describes it as sharp pains and a lower level pressure pulling type pain which is worse especially with movement and walking. She feels that she has had some chills but no fever, no nausea or vomiting. She has been taking Linzess , chronically but says that he is not had a bowel movement over the past couple of days. Sometimes she has not like stools and other times liquid stools. Has not noted any melena or hematochezia. She denies any current urinary symptoms She also has a migraine headache today and generally feels miserable. Last colonoscopy was done in December of 2012 showing sigmoid diverticular disease, and otherwise a normal exam    Review of Systems  Constitutional: Positive for appetite change.  HENT: Negative.   Eyes: Negative.   Respiratory: Negative.   Cardiovascular: Negative.   Gastrointestinal: Positive for abdominal pain and constipation.  Genitourinary: Negative.   Musculoskeletal: Negative.   Skin: Negative.   Neurological: Negative.   Hematological: Negative.   Psychiatric/Behavioral: Negative.    Outpatient Prescriptions Prior to Visit  Medication Sig Dispense Refill  . Aspirin-Acetaminophen-Caffeine (GOODY HEADACHE PO) Take by mouth as needed.      Marland Kitchen BIOTIN PO Take 1 tablet by mouth at bedtime.        Marland Kitchen BLACK COHOSH PO Take 1 tablet by mouth daily.        . Cholecalciferol (VITAMIN D) 2000 UNITS tablet  Take 2,000 Units by mouth 2 (two) times daily.        Marland Kitchen GARLIC PO Take 1 tablet by mouth 2 (two) times daily.        Marland Kitchen levothyroxine (SYNTHROID, LEVOTHROID) 100 MCG tablet Take 100 mcg by mouth daily.        . Linaclotide (LINZESS) 145 MCG CAPS Take 1 capsule (145 mcg total) by mouth daily.  12 capsule  0  . losartan-hydrochlorothiazide (HYZAAR) 100-25 MG per tablet Take 1 tablet by mouth daily.        Marland Kitchen LYSINE PO Take 1 tablet by mouth at bedtime.        . magnesium citrate SOLN Take 1 Bottle by mouth as needed.      . metFORMIN (GLUCOPHAGE) 1000 MG tablet Take 1,000 mg by mouth 2 (two) times daily with a meal.        . montelukast (SINGULAIR) 10 MG tablet Take 10 mg by mouth at bedtime.      . Multiple Vitamins-Minerals (MULTIVITAMIN WITH MINERALS) tablet Take 1 tablet by mouth daily.        . temazepam (RESTORIL) 30 MG capsule Take 30 mg by mouth at bedtime as needed.        . valACYclovir (VALTREX) 500 MG tablet Take 500 mg by mouth 2 (two) times daily as needed.        . [DISCONTINUED] AMBULATORY NON FORMULARY MEDICATION Medication Name: Pronolax -1 tablet by mouth daily      . [  DISCONTINUED] amoxicillin-clavulanate (AUGMENTIN) 875-125 MG per tablet Take 1 tablet by mouth 2 (two) times daily.  28 tablet  0  . [DISCONTINUED] esomeprazole (NEXIUM) 40 MG capsule Take 40 mg by mouth daily before breakfast.      . [DISCONTINUED] FIBER FORMULA CAPS Take 2 capsules by mouth daily.      . [DISCONTINUED] fluconazole (DIFLUCAN) 150 MG tablet Take 1 tablet (150 mg total) by mouth once.  2 tablet  1  . [DISCONTINUED] Linaclotide (LINZESS) 145 MCG CAPS Take 1 capsule (145 mcg total) by mouth daily.  30 capsule  1  . [DISCONTINUED] traMADol (ULTRAM) 50 MG tablet Take 1 tablet (50 mg total) by mouth every 6 (six) hours as needed for pain.  20 tablet  0   Last reviewed on 05/10/2012  4:23 PM by Sammuel Cooper, PA  No Known Allergies Patient Active Problem List  Diagnosis  . Diverticulosis  .  Diverticulitis large intestine  . Hypothyroid  . HTN (hypertension)  . Diabetes mellitus   History  Substance Use Topics  . Smoking status: Never Smoker   . Smokeless tobacco: Never Used  . Alcohol Use: Yes     Comment: rarely   family history includes COPD in her mother; Diabetes in her mother and sister; Diverticulitis in her mother; Heart disease in her mother; Leukemia in her mother; and Lung cancer in her mother.  There is no history of Colon cancer.     Objective:   Physical Exam well-developed AA female in no acute distress blood pressure 120/70 pulse 110. HEENT; nontraumatic normocephalic EOMI PERRLA sclera anicteric,Neck; Supple no JVD, Cardiovascular; regular rate and rhythm with S1-S2 somewhat tachycardia, Pulmonary; clear bilaterally, Abdomen ;soft nondistended bowel sounds are present she is quite tender in the left lower quadrant though no rebound no palpable mass or hepatosplenomegaly, Rectal; exam not done, Extremities; no clubbing, cyanosis ,or edema skin warm and dry, Psych; mood and affect normal and appropriate.        Assessment & Plan:  #62 52 year old female with recurrent diverticulitis/sigmoid.  Plan; start Augmentin 875 one by mouth twice daily x14 days Diflucan 150 mg one tablet if needed for vaginal candidiasis Vicodin 5/ 500 every 4-6 hours as needed for pain #30 no refills Will give her a trial of higher dose Linzess at 290 mcg, samples given today and if she finds this beneficial Will give her a prescription Patient will remain out of work until Thursday, 05/14/2011 She is asked to call back in 48 hours with a progress report, or at any point if her symptoms worsen. If she has not had significant improvement in the next 48 hours may need CT of the abdomen and pelvis.

## 2012-05-14 ENCOUNTER — Other Ambulatory Visit: Payer: 59

## 2012-05-29 ENCOUNTER — Other Ambulatory Visit: Payer: Self-pay | Admitting: Physician Assistant

## 2012-06-12 ENCOUNTER — Other Ambulatory Visit: Payer: Self-pay

## 2013-01-05 ENCOUNTER — Ambulatory Visit (HOSPITAL_COMMUNITY)
Admission: RE | Admit: 2013-01-05 | Discharge: 2013-01-05 | Disposition: A | Discharge status: Home / Self Care | Payer: 59 | Source: Ambulatory Visit | Attending: Emergency Medicine | Admitting: Emergency Medicine

## 2013-01-05 ENCOUNTER — Other Ambulatory Visit (HOSPITAL_COMMUNITY): Payer: Self-pay | Admitting: Emergency Medicine

## 2013-01-05 ENCOUNTER — Other Ambulatory Visit (HOSPITAL_COMMUNITY): Payer: Self-pay | Admitting: Internal Medicine

## 2013-01-05 DIAGNOSIS — R109 Unspecified abdominal pain: Secondary | ICD-10-CM

## 2013-01-05 DIAGNOSIS — R142 Eructation: Secondary | ICD-10-CM | POA: Insufficient documentation

## 2013-01-05 DIAGNOSIS — R141 Gas pain: Secondary | ICD-10-CM | POA: Insufficient documentation

## 2013-01-05 DIAGNOSIS — Z9089 Acquired absence of other organs: Secondary | ICD-10-CM | POA: Insufficient documentation

## 2013-01-12 ENCOUNTER — Encounter: Payer: Self-pay | Admitting: *Deleted

## 2013-01-18 ENCOUNTER — Other Ambulatory Visit (INDEPENDENT_AMBULATORY_CARE_PROVIDER_SITE_OTHER): Payer: 59

## 2013-01-18 ENCOUNTER — Ambulatory Visit (INDEPENDENT_AMBULATORY_CARE_PROVIDER_SITE_OTHER): Payer: 59 | Admitting: Internal Medicine

## 2013-01-18 ENCOUNTER — Encounter: Payer: Self-pay | Admitting: Internal Medicine

## 2013-01-18 VITALS — BP 130/80 | HR 80 | Ht 60.0 in | Wt 189.0 lb

## 2013-01-18 DIAGNOSIS — R1013 Epigastric pain: Secondary | ICD-10-CM

## 2013-01-18 DIAGNOSIS — K589 Irritable bowel syndrome without diarrhea: Secondary | ICD-10-CM

## 2013-01-18 DIAGNOSIS — R141 Gas pain: Secondary | ICD-10-CM

## 2013-01-18 DIAGNOSIS — R140 Abdominal distension (gaseous): Secondary | ICD-10-CM

## 2013-01-18 LAB — CBC WITH DIFFERENTIAL/PLATELET
Basophils Absolute: 0 10*3/uL (ref 0.0–0.1)
Basophils Relative: 0.4 % (ref 0.0–3.0)
Eosinophils Relative: 4.3 % (ref 0.0–5.0)
Hemoglobin: 14.1 g/dL (ref 12.0–15.0)
Lymphocytes Relative: 36.1 % (ref 12.0–46.0)
MCHC: 33.3 g/dL (ref 30.0–36.0)
MCV: 85.4 fl (ref 78.0–100.0)
Neutro Abs: 4.4 10*3/uL (ref 1.4–7.7)
Neutrophils Relative %: 51.4 % (ref 43.0–77.0)
RBC: 4.95 Mil/uL (ref 3.87–5.11)
WBC: 8.5 10*3/uL (ref 4.5–10.5)

## 2013-01-18 MED ORDER — SUCRALFATE 1 GM/10ML PO SUSP: 1.0000 g | Freq: Two times a day (BID) | ORAL | Status: DC

## 2013-01-18 MED ORDER — VSL#3 PO CAPS: 1.0000 | ORAL_CAPSULE | Freq: Every day | ORAL | Status: DC

## 2013-01-18 NOTE — Progress Notes (Signed)
NIKA YAZZIE 1960/11/23 MRN [id number]   History of Present Illness:  This is a 52 year old African American female with abdominal distention, tightness in the epigastrium and chronic constipation. She has been taking excess laxatives and her stools are becoming liquidy. She feels there is an obstruction in her abdomen because of liquid stools. She had a colonoscopy in December 2012 which showed diverticulosis of the left colon. She saw Mike Gip, PA-C in January 2014 for diverticulitis and was treated with Augmentin, Diflucan and Vicodin. She is status post remote cholecystectomy. She is worried about her abdominal distention and possible obstruction.   Past Medical History  Diagnosis Date  . Arthritis   . Constipation, chronic   . PCOS (polycystic ovarian syndrome)     takes metformin for this  . Hypertension   . Thyroid disease   . Fibroid uterus   . Diverticulitis   . Migraine   . History of gallstones   . Diverticulosis   . Hypothyroidism    Past Surgical History  Procedure Laterality Date  . Breast reduction surgery  1988  . Cholecystectomy  1995  . Tonsillectomy and adenoidectomy  1990's  . Lasik Left   . Refractive surgery Left     reports that she has never smoked. She has never used smokeless tobacco. She reports that  drinks alcohol. She reports that she does not use illicit drugs. family history includes COPD in her mother; Diabetes in her mother and sister; Diverticulitis in her mother; Heart disease in her mother; Leukemia in her mother; Lung cancer in her mother. There is no history of Colon cancer. No Known Allergies      Review of Systems: Positive for dyspepsia epigastric discomfort  The remainder of the 10 point ROS is negative except as outlined in H&P   Physical Exam: General appearance  Well developed, in no distress. Eyes- non icteric. HEENT nontraumatic, normocephalic. Mouth no lesions, tongue papillated, no cheilosis. Neck supple without  adenopathy, thyroid not enlarged, no carotid bruits, no JVD. Lungs Clear to auscultation bilaterally. Cor normal S1, normal S2, regular rhythm, no murmur,  quiet precordium. Abdomen: Obese protuberant abdomen with normal active bowel sounds. Tenderness in the mid epigastrium. No palpable mass. No bruits. Liver edge at costal margin. Rectal: Soft Hemoccult negative stool. Extremities no pedal edema. Skin no lesions. Neurological alert and oriented x 3. Psychological normal mood and affect.  Assessment and Plan:  Problem #31 52 year old [place] American female with abdominal bloating, distention. She had a normal colonoscopy less than 2 years ago. She has chronic constipation but is currently having diarrhea due to  laxatives. Her exam is suggestive of gastritis. We will proceed with a CT scan of the abdomen and pelvis and we will also obtain a CMET, CBC and sedimentation rate. She will continue pantoprazole 40 mg daily and her samples of probiotic one a day as well as Carafate slurry 10 cc by mouth twice a day. Depending on the CT scan of the abdomen, she will need an upper endoscopy.   01/18/2013 Lina Sar

## 2013-01-18 NOTE — Patient Instructions (Addendum)
You have been scheduled for a CT scan of the abdomen and pelvis at [facility] ([address]---this is in the same building as [facility]).   You are scheduled on 01/19/13 at 3:00 pm. You should arrive 15 minutes prior to your appointment time for registration. Please follow the written instructions below on the day of your exam:  WARNING: IF YOU ARE ALLERGIC TO IODINE/X-RAY DYE, PLEASE NOTIFY RADIOLOGY IMMEDIATELY AT [phone]! YOU WILL BE GIVEN A 13 HOUR PREMEDICATION PREP.  1) Do not eat or drink anything after 11:00 am (4 hours prior to your test) 2) You have been given 2 bottles of oral contrast to drink. The solution may taste better if refrigerated, but do NOT add ice or any other liquid to this solution. Shake well before drinking.    Drink 1 bottle of contrast @ 1:00 pm (2 hours prior to your exam)  Drink 1 bottle of contrast @ 2:00 pm (1 hour prior to your exam)  You may take any medications as prescribed with a small amount of water except for the following: Metformin, Glucophage, Glucovance, Avandamet, Riomet, Fortamet, Actoplus Met, Janumet, Glumetza or Metaglip. The above medications must be held the day of the exam AND 48 hours after the exam.  The purpose of you drinking the oral contrast is to aid in the visualization of your intestinal tract. The contrast solution may cause some diarrhea. Before your exam is started, you will be given a small amount of fluid to drink. Depending on your individual set of symptoms, you may also receive an intravenous injection of x-ray contrast/dye. Plan on being at [facility] for 30 minutes or long, depending on the type of exam you are having performed.  This test typically takes 30-45 minutes to complete.  If you have any questions regarding your exam or if you need to reschedule, you may call the CT department at [phone] between the hours of 8:00 am and 5:00 pm,  Monday-Friday.  ________________________________________________________________________ Your physician has requested that you go to the basement for the following lab work before leaving today: CBC, CMET, Sed Rate  We have sent the following medications to your pharmacy for you to pick up at your convenience: Carafate  We have given you samples of the following medication to take: VSL #3 [place]annie [place]

## 2013-01-19 ENCOUNTER — Ambulatory Visit (INDEPENDENT_AMBULATORY_CARE_PROVIDER_SITE_OTHER)
Admission: RE | Admit: 2013-01-19 | Discharge: 2013-01-19 | Disposition: A | Discharge status: Home / Self Care | Payer: 59 | Source: Ambulatory Visit | Attending: Internal Medicine | Admitting: Internal Medicine

## 2013-01-19 DIAGNOSIS — R140 Abdominal distension (gaseous): Secondary | ICD-10-CM

## 2013-01-19 DIAGNOSIS — R141 Gas pain: Secondary | ICD-10-CM

## 2013-01-19 LAB — COMPREHENSIVE METABOLIC PANEL
AST: 19 U/L (ref 0–37)
Albumin: 3.8 g/dL (ref 3.5–5.2)
BUN: 15 mg/dL (ref 6–23)
Calcium: 9.5 mg/dL (ref 8.4–10.5)
Chloride: 103 mEq/L (ref 96–112)
Creatinine, Ser: 0.6 mg/dL (ref 0.4–1.2)
GFR: 125.03 mL/min (ref >60.00)
Glucose, Bld: 125 mg/dL — ABNORMAL HIGH (ref 70–99)
Potassium: 4.4 mEq/L (ref 3.5–5.1)
Total Protein: 6.9 g/dL (ref 6.0–8.3)

## 2013-01-19 LAB — SEDIMENTATION RATE: Sed Rate: 18 mm/hr (ref 0–22)

## 2013-01-19 MED ORDER — IOHEXOL 300 MG/ML  SOLN
100.0000 mL | Freq: Once | INTRAMUSCULAR | Status: AC | PRN
  Administered 2013-01-19: 100 mL via INTRAVENOUS

## 2013-01-25 ENCOUNTER — Ambulatory Visit: Payer: 59 | Admitting: Internal Medicine

## 2013-03-02 ENCOUNTER — Other Ambulatory Visit: Payer: Self-pay

## 2013-03-02 DIAGNOSIS — Z1231 Encounter for screening mammogram for malignant neoplasm of breast: Secondary | ICD-10-CM

## 2013-03-03 ENCOUNTER — Other Ambulatory Visit: Payer: Self-pay

## 2013-03-30 ENCOUNTER — Ambulatory Visit
Admission: RE | Admit: 2013-03-30 | Discharge: 2013-03-30 | Disposition: A | Discharge status: Home / Self Care | Payer: 59 | Source: Ambulatory Visit

## 2013-03-30 DIAGNOSIS — Z1231 Encounter for screening mammogram for malignant neoplasm of breast: Secondary | ICD-10-CM

## 2013-04-04 ENCOUNTER — Ambulatory Visit: Payer: 59

## 2013-04-04 ENCOUNTER — Other Ambulatory Visit: Payer: Self-pay | Admitting: Emergency Medicine

## 2013-04-18 ENCOUNTER — Other Ambulatory Visit: Payer: Self-pay | Admitting: Physician Assistant

## 2013-04-18 MED ORDER — TEMAZEPAM 30 MG PO CAPS: ORAL_CAPSULE | ORAL | Status: DC

## 2013-07-12 ENCOUNTER — Ambulatory Visit: Payer: Self-pay | Admitting: Physician Assistant

## 2013-07-26 ENCOUNTER — Other Ambulatory Visit: Payer: Self-pay | Admitting: Internal Medicine

## 2013-07-27 ENCOUNTER — Other Ambulatory Visit: Payer: Self-pay | Admitting: Internal Medicine

## 2013-08-12 ENCOUNTER — Encounter: Payer: Self-pay | Admitting: Physician Assistant

## 2013-08-23 ENCOUNTER — Other Ambulatory Visit: Payer: Self-pay | Admitting: Physician Assistant

## 2013-09-01 ENCOUNTER — Ambulatory Visit (INDEPENDENT_AMBULATORY_CARE_PROVIDER_SITE_OTHER): Payer: 59 | Admitting: Physician Assistant

## 2013-09-01 ENCOUNTER — Encounter: Payer: Self-pay | Admitting: Physician Assistant

## 2013-09-01 VITALS — BP 110/74 | HR 96 | Ht 60.0 in | Wt 169.0 lb

## 2013-09-01 DIAGNOSIS — K589 Irritable bowel syndrome without diarrhea: Secondary | ICD-10-CM

## 2013-09-01 DIAGNOSIS — K5732 Diverticulitis of large intestine without perforation or abscess without bleeding: Secondary | ICD-10-CM

## 2013-09-01 DIAGNOSIS — K5900 Constipation, unspecified: Secondary | ICD-10-CM

## 2013-09-01 DIAGNOSIS — K5909 Other constipation: Secondary | ICD-10-CM

## 2013-09-01 MED ORDER — FLUCONAZOLE 150 MG PO TABS: ORAL_TABLET | ORAL | Status: DC

## 2013-09-01 MED ORDER — AMOXICILLIN-POT CLAVULANATE 875-125 MG PO TABS: 1.0000 | ORAL_TABLET | Freq: Two times a day (BID) | ORAL | Status: DC

## 2013-09-01 MED ORDER — HYDROCODONE-ACETAMINOPHEN 5-325 MG PO TABS: 1.0000 | ORAL_TABLET | Freq: Four times a day (QID) | ORAL | Status: DC | PRN

## 2013-09-01 NOTE — Patient Instructions (Signed)
We have given you a prescription to take to [facility], at the corner of [address].  We also sent the diflucan for the yeast infection.  We have given you the probiotic.

## 2013-09-01 NOTE — Progress Notes (Signed)
Reviewed and agree.

## 2013-09-01 NOTE — Progress Notes (Signed)
Subjective:    Patient ID: Kelly Goodwin, female    DOB: [birth year], 53 y.o.   MRN: [id number]  HPI Kevona is a very nice 53 year old African American female known to Dr. Delfin Edis. She has history of hypertension hypothyroidism adult-onset diabetes mellitus and is followed here for chronic constipation and diverticular disease. Patient had colonoscopy in December of 2012 negative with the exception of sigmoid diverticulosis. She was treated for an episode of diverticulitis in January of 2014 and has done well until about 2 weeks ago when she developed recurrent left lower quadrant pain. She has been exercising and had lost about 20 pounds over the past year. She says initially she thought the left-sided pain was a strain from exercising but it has become constant and dull and now feels more like her diverticulitis did in the past. She says she gets occasional sharp jabs  pain. No fever or chills ,no urinary symptoms. Bowels are moving fairly regularly. No melena or hematochezia. She says she's been eating a lot of fiber and drinking a lot of water which typically helps her bowels and is no longer taking Linzess. She says she had gotten samples of VSL-3  last time she was here and she says she "absolutely loves it". She says too expensive to  stay on all the time ,but  worked very well for her IBS symptoms  .    Review of Systems  Constitutional: Negative.   HENT: Negative.   Eyes: Negative.   Respiratory: Negative.   Cardiovascular: Negative.   Gastrointestinal: Positive for abdominal pain and constipation.  Endocrine: Negative.   Genitourinary: Negative.   Musculoskeletal: Negative.   Skin: Negative.   Allergic/Immunologic: Negative.   Neurological: Negative.   Hematological: Negative.   Psychiatric/Behavioral: Negative.    Outpatient Prescriptions Prior to Visit  Medication Sig Dispense Refill  . Aspirin-Acetaminophen-Caffeine (GOODY HEADACHE PO) Take by mouth as needed.      .  Calcium Carb-Cholecalciferol (CALCIUM 1000 + D) 1000-800 MG-UNIT TABS Take 1 tablet by mouth daily.      . Cholecalciferol (VITAMIN D) 2000 UNITS tablet Take 2,000 Units by mouth 2 (two) times daily.        Marland Kitchen GARLIC PO Take 1 tablet by mouth 2 (two) times daily.        Marland Kitchen levothyroxine (SYNTHROID, LEVOTHROID) 100 MCG tablet Take 100 mcg by mouth daily.        Marland Kitchen losartan-hydrochlorothiazide (HYZAAR) 100-25 MG per tablet Take 1 tablet by mouth daily.        Marland Kitchen LYSINE PO Take 1 tablet by mouth at bedtime.        . magnesium citrate SOLN Take 1 Bottle by mouth as needed.      . metFORMIN (GLUCOPHAGE) 1000 MG tablet Take 1 tablet twice a day  for diabetes  180 tablet  0  . Multiple Vitamins-Minerals (MULTIVITAMIN WITH MINERALS) tablet Take 1 tablet by mouth daily.        . Probiotic Product (VSL#3) CAPS Take 1 capsule by mouth daily.  1 capsule  0  . temazepam (RESTORIL) 30 MG capsule TAKE 1 CAPSULE BY MOUTH ONCE DAILY AT BEDTIME  90 capsule  0  . valACYclovir (VALTREX) 500 MG tablet Take 500 mg by mouth 2 (two) times daily as needed.        Marland Kitchen BLACK COHOSH PO Take 1 tablet by mouth daily.        Marland Kitchen HYDROcodone-acetaminophen (VICODIN) 5-500 MG per tablet Take 1 tab  every 4-6 hours as needed for pain.  40 tablet  0  . norethindrone-ethinyl estradiol (FEMHRT 1/5) 1-5 MG-MCG TABS Take 1 tablet by mouth daily.      . sucralfate (CARAFATE) 1 GM/10ML suspension Take 10 mLs (1 g total) by mouth 2 (two) times daily.  420 mL  0   No facility-administered medications prior to visit.   No Known Allergies Patient Active Problem List   Diagnosis Date Noted  . Irritable bowel syndrome (IBS) 09/01/2013  . Diverticulosis 06/05/2011  . Diverticulitis large intestine 06/05/2011  . Hypothyroid 06/05/2011  . HTN (hypertension) 06/05/2011  . Diabetes mellitus 06/05/2011   History  Substance Use Topics  . Smoking status: Never Smoker   . Smokeless tobacco: Never Used  . Alcohol Use: Yes     Comment: rarely       family history includes COPD in her mother; Diabetes in her mother and sister; Diverticulitis in her mother; Heart disease in her mother; Leukemia in her mother; Lung cancer in her mother. There is no history of Colon cancer.  Objective:   Physical Exam  African American female in no acute distress, pleasant blood pressure 110/74 pulse 96 height 5 foot weight 169. HEENT; nontraumatic normocephalic EOMI PERRLA sclera anicteric, Supple; no JVD, Cardiovascular; regular rate and rhythm with S1-S2 no murmur or gallop, Pulmonary clear bilaterally, Abdomen ;soft bowel sounds are active, no palpable mass or hepatosplenomegaly she is very tender in the left lower quadrant no guarding or rebound, Rectal; exam not done, Ext; no clubbing cyanosis or edema skin warm dry, Psych; mood and affect appropriate      Assessment & Plan: #25 #  #33 53 year old female with recurrent sigmoid diverticulitis #2 chronic constipation #3 IBS #4.onset diabetes mellitus #5 hypertension #6 hypothyroid  Plan; start Augmentin 875 mg by mouth twice daily with food x10 days Vicodin 5/301 every 6-8 hours if needed for pain Diflucan 150 mg x1 and repeat in 3 days if needed for vaginal candidiasis which usually occurs when she takes antibiotics She was given samples of VSL 3, and also discussed using other over-the-counter less expensive probiotics like Culturelle , and Restora. She will followup with Dr. Olevia Perches  as needed ,and knows to call should her symptoms worsen or fail to improve on antibiotics

## 2013-09-06 DIAGNOSIS — E785 Hyperlipidemia, unspecified: Secondary | ICD-10-CM | POA: Insufficient documentation

## 2013-09-06 DIAGNOSIS — M1990 Unspecified osteoarthritis, unspecified site: Secondary | ICD-10-CM | POA: Insufficient documentation

## 2013-09-06 DIAGNOSIS — R7309 Other abnormal glucose: Secondary | ICD-10-CM | POA: Insufficient documentation

## 2013-09-06 DIAGNOSIS — G43909 Migraine, unspecified, not intractable, without status migrainosus: Secondary | ICD-10-CM | POA: Insufficient documentation

## 2013-09-06 DIAGNOSIS — E079 Disorder of thyroid, unspecified: Secondary | ICD-10-CM | POA: Insufficient documentation

## 2013-09-06 DIAGNOSIS — E282 Polycystic ovarian syndrome: Secondary | ICD-10-CM | POA: Insufficient documentation

## 2013-09-06 DIAGNOSIS — E1165 Type 2 diabetes mellitus with hyperglycemia: Secondary | ICD-10-CM | POA: Insufficient documentation

## 2013-09-07 ENCOUNTER — Encounter: Payer: Self-pay | Admitting: Physician Assistant

## 2013-09-11 ENCOUNTER — Other Ambulatory Visit: Payer: Self-pay | Admitting: Physician Assistant

## 2013-09-15 ENCOUNTER — Ambulatory Visit (INDEPENDENT_AMBULATORY_CARE_PROVIDER_SITE_OTHER): Payer: 59 | Admitting: Physician Assistant

## 2013-09-15 ENCOUNTER — Encounter: Payer: Self-pay | Admitting: Physician Assistant

## 2013-09-15 VITALS — BP 128/70 | HR 76 | Temp 97.7°F | Resp 16 | Wt 167.0 lb

## 2013-09-15 DIAGNOSIS — E669 Obesity, unspecified: Secondary | ICD-10-CM

## 2013-09-15 DIAGNOSIS — Z0000 Encounter for general adult medical examination without abnormal findings: Secondary | ICD-10-CM

## 2013-09-15 DIAGNOSIS — N951 Menopausal and female climacteric states: Secondary | ICD-10-CM

## 2013-09-15 DIAGNOSIS — R232 Flushing: Secondary | ICD-10-CM

## 2013-09-15 LAB — URINALYSIS, ROUTINE W REFLEX MICROSCOPIC
Bilirubin Urine: NEGATIVE
Glucose, UA: >1000 mg/dL — AB
Hgb urine dipstick: NEGATIVE
Ketones, ur: NEGATIVE mg/dL
LEUKOCYTES UA: NEGATIVE
Nitrite: NEGATIVE
PROTEIN: NEGATIVE mg/dL
SPECIFIC GRAVITY, URINE: 1.007 (ref 1.005–1.030)
UROBILINOGEN UA: 0.2 mg/dL (ref 0.0–1.0)
pH: 7 (ref 5.0–8.0)

## 2013-09-15 LAB — CBC WITH DIFFERENTIAL/PLATELET
BASOS ABS: 0.1 10*3/uL (ref 0.0–0.1)
Basophils Relative: 1 % (ref 0–1)
EOS ABS: 0.2 10*3/uL (ref 0.0–0.7)
Eosinophils Relative: 3 % (ref 0–5)
HCT: 41 % (ref 36.0–46.0)
Hemoglobin: 13.6 g/dL (ref 12.0–15.0)
LYMPHS ABS: 2.7 10*3/uL (ref 0.7–4.0)
LYMPHS PCT: 45 % (ref 12–46)
MCH: 28.6 pg (ref 26.0–34.0)
MCHC: 33.2 g/dL (ref 30.0–36.0)
MCV: 86.1 fL (ref 78.0–100.0)
Monocytes Absolute: 0.4 10*3/uL (ref 0.1–1.0)
Monocytes Relative: 7 % (ref 3–12)
NEUTROS PCT: 44 % (ref 43–77)
Neutro Abs: 2.6 10*3/uL (ref 1.7–7.7)
PLATELETS: 359 10*3/uL (ref 150–400)
RBC: 4.76 MIL/uL (ref 3.87–5.11)
RDW: 13.5 % (ref 11.5–15.5)
WBC: 5.9 10*3/uL (ref 4.0–10.5)

## 2013-09-15 MED ORDER — LORCASERIN HCL 10 MG PO TABS: 10.0000 mg | ORAL_TABLET | Freq: Two times a day (BID) | ORAL | Status: DC

## 2013-09-15 MED ORDER — ESTRADIOL-NORETHINDRONE ACET 1-0.5 MG PO TABS: 1.0000 | ORAL_TABLET | Freq: Every day | ORAL | Status: DC

## 2013-09-15 NOTE — Progress Notes (Signed)
Complete Physical  Assessment and Plan: PCOS (polycystic ovarian syndrome)- cont metformin  Hypertension-- continue medications, DASH diet, exercise and monitor at home. Call if greater than 130/80.   Fibroid uterus- check for anemia  Diverticulitis- continue follow up with GI and finish ABX  Migraine-information given to the patient, no gum/decrease hard foods, warm wet wash clothes, decrease stress, talk with dentist about possible night guard, can do massage, and exercise.   Hypothyroidism-check TSH level, continue medications the same.   Hyperlipidemia--continue medications, check lipids, decrease fatty foods, increase activity.   Arthritis- continue follow up as needed  Prediabetes- cut back on invokana to 150mg  daily and can decrease  Obesity with co morbidities- long discussion about weight loss, diet, and exercise- not tolerant of phentermine- will try belviq.  Hot flashes- + post menopausal symptoms, needs progesterone and we will start her on low dose estrogen. Risks of clotting and breast cancer were discussed and she understands. In addition, the cardiovascular,bone, and other benefits were discussed. She does not smoke. No personal history of breast CA or blood clots, she will be be on 81mg  aspirin and she will get mammograms regularly. try Estradiol 1/Nor 0.5 daily.   Discussed med's effects and SE's. Screening labs and tests as requested with regular follow-up as recommended.  HPI 53 y.o. female  presents for a complete physical. Her blood pressure has been controlled at home, today their BP is BP: 128/70 mmHg She does workout, zumba. She denies chest pain, shortness of breath, dizziness.  She went to a holistic doctor, Dr. Heath Gold, for her hot flashes and who took her labs in 07/08/2013. She was put on Minivelle patch which is helping but she can not do the patch.  She is not on cholesterol medication and denies myalgias. Her cholesterol is not at goal. The cholesterol last  visit was: HDL 57 and LDL was 131.  She has been working on diet and exercise for prediabetes, her weight is down 20lbs and she has been on invokana and metformin 2 a day, and denies paresthesia of the feet, polydipsia and polyuria. Last A1C in the office was: 5.3 (6.0).  Patient is on Vitamin D supplement.   Obesity- she is down 20 lbs with diet and exercise. She would like to try belviq.  She has had a recent diverticulitis flare and is on ABX for this.    Current Medications:  Current Outpatient Prescriptions on File Prior to Visit  Medication Sig Dispense Refill  . amoxicillin-clavulanate (AUGMENTIN) 875-125 MG per tablet TAKE 1 TABLET BY MOUTH TWICE DAILY  20 tablet  0  . Calcium Carb-Cholecalciferol (CALCIUM 1000 + D) 1000-800 MG-UNIT TABS Take 1 tablet by mouth daily.      . Cholecalciferol (VITAMIN D) 2000 UNITS tablet Take 2,000 Units by mouth 2 (two) times daily.        . fluconazole (DIFLUCAN) 150 MG tablet Take 1 tab and repeat as needed for vaginal yeast.  2 tablet  1  . GARLIC PO Take 1 tablet by mouth 2 (two) times daily.        . INVOKANA 300 MG TABS       . levothyroxine (SYNTHROID, LEVOTHROID) 100 MCG tablet Take 100 mcg by mouth daily.        Marland Kitchen losartan-hydrochlorothiazide (HYZAAR) 100-25 MG per tablet Take 1 tablet by mouth daily.        Marland Kitchen LYSINE PO Take 1 tablet by mouth at bedtime.        Marland Kitchen  magnesium citrate SOLN Take 1 Bottle by mouth as needed.      . metFORMIN (GLUCOPHAGE) 1000 MG tablet Take 1 tablet twice a day  for diabetes  180 tablet  0  . Multiple Vitamins-Minerals (MULTIVITAMIN WITH MINERALS) tablet Take 1 tablet by mouth daily.        . NON FORMULARY Dim Plus bid      . pantoprazole (PROTONIX) 40 MG tablet       . Probiotic Product (VSL#3) CAPS Take 1 capsule by mouth daily.  1 capsule  0  . temazepam (RESTORIL) 30 MG capsule TAKE 1 CAPSULE BY MOUTH ONCE DAILY AT BEDTIME  90 capsule  0  . valACYclovir (VALTREX) 500 MG tablet Take 500 mg by mouth 2 (two)  times daily as needed.         No current facility-administered medications on file prior to visit.   Health Maintenance:   Immunization History  Administered Date(s) Administered  . Tdap 07/08/2011   Tetanus: 2013 Pneumovax: N/A Flu vaccine: 2014 at work Zostavax: N/A Pap: 03/2013 normal- Dr. Garwin Brothers MGM: 03/2013 DEXA: N/A Colonoscopy: 2012 Dr. Olevia Perches EGD: n/A  Allergies: No Known Allergies Medical History:  Past Medical History  Diagnosis Date  . Constipation, chronic   . PCOS (polycystic ovarian syndrome)     takes metformin for this  . Hypertension   . Fibroid uterus   . Diverticulitis   . Migraine   . History of gallstones   . Diverticulosis   . Type II or unspecified type diabetes mellitus without mention of complication, not stated as uncontrolled   . Thyroid disease   . Hypothyroidism   . Hyperlipidemia   . Arthritis   . Prediabetes    Surgical History:  Past Surgical History  Procedure Laterality Date  . Breast reduction surgery  1988  . Cholecystectomy  1995  . Tonsillectomy and adenoidectomy  1990's  . Lasik Left   . Refractive surgery Left    Family History:  Family History  Problem Relation Age of Onset  . Colon cancer Neg Hx   . Diabetes Mother   . Lung cancer Mother   . Diabetes Sister   . COPD Mother   . Diverticulitis Mother   . Leukemia Mother   . Heart disease Mother    Social History:  History  Substance Use Topics  . Smoking status: Never Smoker   . Smokeless tobacco: Never Used  . Alcohol Use: Yes     Comment: rarely     Review of Systems: [X]  = complains of  [ ]  = denies  General: Fatigue [ ]  Fever [ ]  Chills [ ]  Weakness [ ]   Insomnia [ ] Weight change [ ]  Night sweats [ ]   Change in appetite [ ]  Eyes: Redness [ ]  Blurred vision [ ]  Diplopia [ ]  Discharge [ ]   ENT: Congestion [ ]  Sinus Pain [ ]  Post Nasal Drip [ ]  Sore Throat [ ]  Earache [ ]  hearing loss [ ]  Tinnitus [ ]  Snoring [ ]   Cardiac: Chest pain/pressure [ ]  SOB  [ ]  Orthopnea [ ]   Palpitations [ ]   Paroxysmal nocturnal dyspnea[ ]  Claudication [ ]  Edema [ ]   Pulmonary: Cough [ ]  Wheezing[ ]   SOB [ ]   Pleurisy [ ]   GI: Nausea [ ]  Vomiting[ ]  Dysphagia[ ]  Heartburn[ ]  Abdominal pain [ ]  Constipation [ ] ; Diarrhea [ ]  BRBPR [ ]  Melena[ ]  Bloating [ ]  Hemorrhoids [ ]   GU: Hematuria[ ]  Dysuria [ ]   Nocturia[ ]  Urgency [ ]   Hesitancy [ ]  Discharge [ ]  Frequency [ ]   Breast:  Breast lumps [ ]   nipple discharge [ ]    Neuro: Headaches[ ]  Vertigo[ ]  Paresthesias[ ]  Spasm [ ]  Speech changes [ ]  Incoordination [ ]   Ortho: Arthritis [ ]  Joint pain [ ]  Muscle pain [ ]  Joint swelling [ ]  Back Pain [ ]  Skin:  Rash [ ]   Pruritis [ ]  Change in skin lesion [ ]   Psych: Depression[ ]  Anxiety[ ]  Confusion [ ]  Memory loss [ ]   Heme/Lypmh: Bleeding [ ]  Bruising [ ]  Enlarged lymph nodes [ ]   Endocrine: Visual blurring [ ]  Paresthesia [ ]  Polyuria [ ]  Polydypsea [ ]    Heat/cold intolerance [ ]  Hypoglycemia [ ]   Physical Exam: Estimated body mass index is 32.62 kg/(m^2) as calculated from the following:   Height as of 09/01/13: 5' (1.524 m).   Weight as of this encounter: 167 lb (75.751 kg). BP 128/70  Pulse 76  Temp(Src) 97.7 F (36.5 C)  Resp 16  Wt 167 lb (75.751 kg) General Appearance: Well nourished, in no apparent distress. Eyes: PERRLA, EOMs, conjunctiva no swelling or erythema, normal fundi and vessels. Sinuses: No Frontal/maxillary tenderness ENT/Mouth: Ext aud canals clear, normal light reflex with TMs without erythema, bulging.  Good dentition. No erythema, swelling, or exudate on post pharynx. Tonsils not swollen or erythematous. Hearing normal.  Neck: Supple, thyroid normal. No bruits Respiratory: Respiratory effort normal, BS equal bilaterally without rales, rhonchi, wheezing or stridor. Cardio: RRR without murmurs, rubs or gallops. Brisk peripheral pulses without edema.  Chest: symmetric, with normal excursions and percussion. Breasts: defer Abdomen: Soft,  +BS. Non tender, no guarding, rebound, hernias, masses, or organomegaly. .  Lymphatics: Non tender without lymphadenopathy.  Genitourinary: defer Musculoskeletal: Full ROM all peripheral extremities,5/5 strength, and normal gait. Skin: Warm, dry without rashes, lesions, ecchymosis.  Neuro: Cranial nerves intact, reflexes equal bilaterally. Normal muscle tone, no cerebellar symptoms. Sensation intact.  Psych: Awake and oriented X 3, normal affect, Insight and Judgment appropriate.   EKG: WNL no changes.   Vicie Mutters 3:49 PM

## 2013-09-15 NOTE — Patient Instructions (Addendum)
Continue the metformin at 2 a day Cut back to the 150 of the invokana for 1 month and then you can do to every other day of the 150 for a month and then stop.    Preventative Care for Adults - Female      MAINTAIN REGULAR HEALTH EXAMS:  A routine yearly physical is a good way to check in with your primary care provider about your health and preventive screening. It is also an opportunity to share updates about your health and any concerns you have, and receive a thorough all-over exam.   Most health insurance companies pay for at least some preventative services.  Check with your health plan for specific coverages.  WHAT PREVENTATIVE SERVICES DO WOMEN NEED?  Adult women should have their weight and blood pressure checked regularly.   Women age 59 and older should have their cholesterol levels checked regularly.  Women should be screened for cervical cancer with a Pap smear and pelvic exam beginning at either age 95, or 3 years after they become sexually activity.    Breast cancer screening generally begins at age 78 with a mammogram and breast exam by your primary care provider.    Beginning at age 15 and continuing to age 76, women should be screened for colorectal cancer.  Certain people may need continued testing until age 24.  Updating vaccinations is part of preventative care.  Vaccinations help protect against diseases such as the flu.  Osteoporosis is a disease in which the bones lose minerals and strength as we age. Women ages 22 and over should discuss this with their caregivers, as should women after menopause who have other risk factors.  Lab tests are generally done as part of preventative care to screen for anemia and blood disorders, to screen for problems with the kidneys and liver, to screen for bladder problems, to check blood sugar, and to check your cholesterol level.  Preventative services generally include counseling about diet, exercise, avoiding tobacco, drugs,  excessive alcohol consumption, and sexually transmitted infections.    GENERAL RECOMMENDATIONS FOR GOOD HEALTH:  Healthy diet:  Eat a variety of foods, including fruit, vegetables, animal or vegetable protein, such as meat, fish, chicken, and eggs, or beans, lentils, tofu, and grains, such as rice.  Drink plenty of water daily.  Decrease saturated fat in the diet, avoid lots of red meat, processed foods, sweets, fast foods, and fried foods.  Exercise:  Aerobic exercise helps maintain good heart health. At least 30-40 minutes of moderate-intensity exercise is recommended. For example, a brisk walk that increases your heart rate and breathing. This should be done on most days of the week.   Find a type of exercise or a variety of exercises that you enjoy so that it becomes a part of your daily life.  Examples are running, walking, swimming, water aerobics, and biking.  For motivation and support, explore group exercise such as aerobic class, spin class, Zumba, Yoga,or  martial arts, etc.    Set exercise goals for yourself, such as a certain weight goal, walk or run in a race such as a 5k walk/run.  Speak to your primary care provider about exercise goals.  Disease prevention:  If you smoke or chew tobacco, find out from your caregiver how to quit. It can literally save your life, no matter how long you have been a tobacco user. If you do not use tobacco, never begin.   Maintain a healthy diet and normal weight. Increased weight  leads to problems with blood pressure and diabetes.   The Body Mass Index or BMI is a way of measuring how much of your body is fat. Having a BMI above 27 increases the risk of heart disease, diabetes, hypertension, stroke and other problems related to obesity. Your caregiver can help determine your BMI and based on it develop an exercise and dietary program to help you achieve or maintain this important measurement at a healthful level.  High blood pressure causes  heart and blood vessel problems.  Persistent high blood pressure should be treated with medicine if weight loss and exercise do not work.   Fat and cholesterol leaves deposits in your arteries that can block them. This causes heart disease and vessel disease elsewhere in your body.  If your cholesterol is found to be high, or if you have heart disease or certain other medical conditions, then you may need to have your cholesterol monitored frequently and be treated with medication.   Ask if you should have a cardiac stress test if your history suggests this. A stress test is a test done on a treadmill that looks for heart disease. This test can find disease prior to there being a problem.  Menopause can be associated with physical symptoms and risks. Hormone replacement therapy is available to decrease these. You should talk to your caregiver about whether starting or continuing to take hormones is right for you.   Osteoporosis is a disease in which the bones lose minerals and strength as we age. This can result in serious bone fractures. Risk of osteoporosis can be identified using a bone density scan. Women ages 26 and over should discuss this with their caregivers, as should women after menopause who have other risk factors. Ask your caregiver whether you should be taking a calcium supplement and Vitamin D, to reduce the rate of osteoporosis.   Avoid drinking alcohol in excess (more than two drinks per day).  Avoid use of street drugs. Do not share needles with anyone. Ask for professional help if you need assistance or instructions on stopping the use of alcohol, cigarettes, and/or drugs.  Brush your teeth twice a day with fluoride toothpaste, and floss once a day. Good oral hygiene prevents tooth decay and gum disease. The problems can be painful, unattractive, and can cause other health problems. Visit your dentist for a routine oral and dental check up and preventive care every 6-12 months.    Look at your skin regularly.  Use a mirror to look at your back. Notify your caregivers of changes in moles, especially if there are changes in shapes, colors, a size larger than a pencil eraser, an irregular border, or development of new moles.  Safety:  Use seatbelts 100% of the time, whether driving or as a passenger.  Use safety devices such as hearing protection if you work in environments with loud noise or significant background noise.  Use safety glasses when doing any work that could send debris in to the eyes.  Use a helmet if you ride a bike or motorcycle.  Use appropriate safety gear for contact sports.  Talk to your caregiver about gun safety.  Use sunscreen with a SPF (or skin protection factor) of 15 or greater.  Lighter skinned people are at a greater risk of skin cancer. Don't forget to also wear sunglasses in order to protect your eyes from too much damaging sunlight. Damaging sunlight can accelerate cataract formation.   Practice safe sex. Use condoms. Condoms  are used for birth control and to help reduce the spread of sexually transmitted infections (or STIs).  Some of the STIs are gonorrhea (the clap), chlamydia, syphilis, trichomonas, herpes, HPV (human papilloma virus) and HIV (human immunodeficiency virus) which causes AIDS. The herpes, HIV and HPV are viral illnesses that have no cure. These can result in disability, cancer and death.   Keep carbon monoxide and smoke detectors in your home functioning at all times. Change the batteries every 6 months or use a model that plugs into the wall.   Vaccinations:  Stay up to date with your tetanus shots and other required immunizations. You should have a booster for tetanus every 10 years. Be sure to get your flu shot every year, since 5%-20% of the [place] population comes down with the flu. The flu vaccine changes each year, so being vaccinated once is not enough. Get your shot in the fall, before the flu season peaks.   Other  vaccines to consider:  Human Papilloma Virus or HPV causes cancer of the cervix, and other infections that can be transmitted from person to person. There is a vaccine for HPV, and females should get immunized between the ages of [address]11 and 7726. It requires a series of 3 shots.   Pneumococcal vaccine to protect against certain types of pneumonia.  This is normally recommended for adults age 165 or older.  However, adults younger than 53 years old with certain underlying conditions such as diabetes, heart or lung disease should also receive the vaccine.  Shingles vaccine to protect against Varicella Zoster if you are older than age 53, or younger than 53 years old with certain underlying illness.  Hepatitis A vaccine to protect against a form of infection of the liver by a virus acquired from food.  Hepatitis B vaccine to protect against a form of infection of the liver by a virus acquired from blood or body fluids, particularly if you work in health care.  If you plan to travel internationally, check with your local health department for specific vaccination recommendations.  Cancer Screening:  Breast cancer screening is essential to preventive care for women. All women age 53 and older should perform a breast self-exam every month. At age 53 and older, women should have their caregiver complete a breast exam each year. Women at ages 2340 and older should have a mammogram (x-ray film) of the breasts. Your caregiver can discuss how often you need mammograms.    Cervical cancer screening includes taking a Pap smear (sample of cells examined under a microscope) from the cervix (end of the uterus). It also includes testing for HPV (Human Papilloma Virus, which can cause cervical cancer). Screening and a pelvic exam should begin at age 53, or 3 years after a woman becomes sexually active. Screening should occur every year, with a Pap smear but no HPV testing, up to age 53. After age 53, you should have a Pap  smear every 3 years with HPV testing, if no HPV was found previously.   Most routine colon cancer screening begins at the age of 10050. On a yearly basis, doctors may provide special easy to use take-home tests to check for hidden blood in the stool. Sigmoidoscopy or colonoscopy can detect the earliest forms of colon cancer and is life saving. These tests use a small camera at the end of a tube to directly examine the colon. Speak to your caregiver about this at age 53, when routine screening begins (and is repeated  every 5 years unless early forms of pre-cancerous polyps or small growths are found).      Bad carbs also include fruit juice, alcohol, and sweet tea. These are empty calories that do not signal to your brain that you are full.   Please remember the good carbs are still carbs which convert into sugar. So please measure them out no more than 1/2-1 cup of rice, oatmeal, pasta, and beans.  Veggies are however free foods! Pile them on.   I like lean protein at every meal such as chicken, [place], pork chops, cottage cheese, etc. Just do not fry these meats and please center your meal around vegetable, the meats should be a side dish.   No all fruit is created equal. Please see the list below, the fruit at the bottom is higher in sugars than the fruit at the top

## 2013-09-16 LAB — IRON AND TIBC
%SAT: 14 % — AB (ref 20–55)
Iron: 59 ug/dL (ref 42–145)
TIBC: 408 ug/dL (ref 250–470)
UIBC: 349 ug/dL (ref 125–400)

## 2013-09-16 LAB — BASIC METABOLIC PANEL WITH GFR
BUN: 14 mg/dL (ref 6–23)
CO2: 26 meq/L (ref 19–32)
Calcium: 9.6 mg/dL (ref 8.4–10.5)
Chloride: 102 mEq/L (ref 96–112)
Creat: 0.55 mg/dL (ref 0.50–1.10)
GFR, Est African American: >89 mL/min
GFR, Est Non African American: >89 mL/min
Glucose, Bld: 85 mg/dL (ref 70–99)
Potassium: 3.3 mEq/L — ABNORMAL LOW (ref 3.5–5.3)
SODIUM: 138 meq/L (ref 135–145)

## 2013-09-16 LAB — TSH: TSH: 0.751 u[IU]/mL (ref 0.350–4.500)

## 2013-09-16 LAB — HEPATIC FUNCTION PANEL
ALT: 16 U/L (ref 0–35)
AST: 15 U/L (ref 0–37)
Albumin: 4.3 g/dL (ref 3.5–5.2)
Alkaline Phosphatase: 64 U/L (ref 39–117)
BILIRUBIN DIRECT: 0.1 mg/dL (ref 0.0–0.3)
BILIRUBIN TOTAL: 0.3 mg/dL (ref 0.2–1.2)
Indirect Bilirubin: 0.2 mg/dL (ref 0.2–1.2)
Total Protein: 6.4 g/dL (ref 6.0–8.3)

## 2013-09-16 LAB — MAGNESIUM: Magnesium: 2 mg/dL (ref 1.5–2.5)

## 2013-09-16 LAB — FERRITIN: FERRITIN: 29 ng/mL (ref 10–291)

## 2013-09-16 LAB — URINALYSIS, MICROSCOPIC ONLY
Bacteria, UA: NONE SEEN
CASTS: NONE SEEN
CRYSTALS: NONE SEEN
SQUAMOUS EPITHELIAL / LPF: NONE SEEN

## 2013-09-16 LAB — MICROALBUMIN / CREATININE URINE RATIO
CREATININE, URINE: 15.6 mg/dL
MICROALB UR: 0.5 mg/dL (ref 0.00–1.89)
Microalb Creat Ratio: 32.1 mg/g — ABNORMAL HIGH (ref 0.0–30.0)

## 2013-09-16 LAB — VITAMIN D 25 HYDROXY (VIT D DEFICIENCY, FRACTURES): VIT D 25 HYDROXY: 62 ng/mL (ref 30–89)

## 2013-09-16 LAB — VITAMIN B12: Vitamin B-12: 1085 pg/mL — ABNORMAL HIGH (ref 211–911)

## 2013-10-08 ENCOUNTER — Other Ambulatory Visit: Payer: Self-pay | Admitting: Internal Medicine

## 2013-10-12 ENCOUNTER — Telehealth: Payer: Self-pay

## 2013-10-12 NOTE — Telephone Encounter (Signed)
Patient advised PA in process for Belviq will be notified by [place] or pharmacy when approved or denied

## 2013-10-26 ENCOUNTER — Encounter: Payer: Self-pay | Admitting: Physician Assistant

## 2013-10-26 ENCOUNTER — Ambulatory Visit (INDEPENDENT_AMBULATORY_CARE_PROVIDER_SITE_OTHER): Payer: 59 | Admitting: Physician Assistant

## 2013-10-26 VITALS — BP 110/70 | HR 80 | Temp 98.6°F | Resp 16 | Wt 167.0 lb

## 2013-10-26 DIAGNOSIS — E876 Hypokalemia: Secondary | ICD-10-CM

## 2013-10-26 DIAGNOSIS — Z79899 Other long term (current) drug therapy: Secondary | ICD-10-CM

## 2013-10-26 DIAGNOSIS — D509 Iron deficiency anemia, unspecified: Secondary | ICD-10-CM

## 2013-10-26 MED ORDER — FLUCONAZOLE 150 MG PO TABS: ORAL_TABLET | ORAL | Status: DC

## 2013-10-26 MED ORDER — LEVOTHYROXINE SODIUM 100 MCG PO TABS: 100.0000 ug | ORAL_TABLET | Freq: Every day | ORAL | Status: DC

## 2013-10-26 NOTE — Progress Notes (Signed)
HPI 53 y.o.female presents for 1 month follow up. She had her CPE, she had hypokalemia, she got started on Estradiol 1/Nor 0.5 daily which she states has helped a lot with her hot flashes, she did have some break through bleeding for 2-3 days. She has continues to lose weight, she does zumba and is taking a 15 week online course she tried belviq and thought she did well with the 15 day supply but she states her insurance did not cover it and it would have been 75 dollars. She was going to back off to the invokana 150mg  but states she feels better when she is on the 300mg , however with the summer and heat and working out she does have some yeast infections occasionally.   Past Medical History  Diagnosis Date  . Constipation, chronic   . PCOS (polycystic ovarian syndrome)     takes metformin for this  . Hypertension   . Fibroid uterus   . Diverticulitis   . Migraine   . History of gallstones   . Diverticulosis   . Type II or unspecified type diabetes mellitus without mention of complication, not stated as uncontrolled   . Thyroid disease   . Hypothyroidism   . Hyperlipidemia   . Arthritis   . Prediabetes      No Known Allergies    Current Outpatient Prescriptions on File Prior to Visit  Medication Sig Dispense Refill  . Aspirin (STANBACK HEADACHE POWDER PO) Take by mouth.      . B Complex Vitamins (VITAMIN-B COMPLEX PO) Take by mouth daily.      . Calcium Carb-Cholecalciferol (CALCIUM 1000 + D) 1000-800 MG-UNIT TABS Take 1 tablet by mouth daily.      . Cholecalciferol (VITAMIN D) 2000 UNITS tablet Take 2,000 Units by mouth 2 (two) times daily.        Marland Kitchen estradiol-norethindrone (ACTIVELLA) 1-0.5 MG per tablet Take 1 tablet by mouth daily.  30 tablet  3  . fluconazole (DIFLUCAN) 150 MG tablet Take 1 tab and repeat as needed for vaginal yeast.  2 tablet  1  . GARLIC PO Take 1 tablet by mouth 2 (two) times daily.        . INVOKANA 300 MG TABS TAKE 1 TABLET BY MOUTH EVERY DAY  30 tablet   PRN  . levothyroxine (SYNTHROID, LEVOTHROID) 100 MCG tablet Take 100 mcg by mouth daily.        . Lorcaserin HCl (BELVIQ) 10 MG TABS Take 10 mg by mouth 2 (two) times daily.  60 tablet  2  . losartan-hydrochlorothiazide (HYZAAR) 100-25 MG per tablet Take 1 tablet by mouth daily.        Marland Kitchen LYSINE PO Take 1 tablet by mouth at bedtime.        . magnesium citrate SOLN Take 1 Bottle by mouth as needed.      . metFORMIN (GLUCOPHAGE) 1000 MG tablet Take 1 tablet twice a day  for diabetes  180 tablet  0  . Multiple Vitamins-Minerals (MULTIVITAMIN WITH MINERALS) tablet Take 1 tablet by mouth daily.        . NON FORMULARY Dim Plus bid      . NON FORMULARY CORTE-B PLAX 2 DAILY      . pantoprazole (PROTONIX) 40 MG tablet       . temazepam (RESTORIL) 30 MG capsule TAKE 1 CAPSULE BY MOUTH ONCE DAILY AT BEDTIME  90 capsule  0  . valACYclovir (VALTREX) 500 MG tablet Take 500 mg by  mouth 2 (two) times daily as needed.         No current facility-administered medications on file prior to visit.    ROS: all negative expect above.   Physical: Filed Weights   10/26/13 1622  Weight: 167 lb (75.751 kg)   BP 110/70  Pulse 80  Temp(Src) 98.6 F (37 C)  Resp 16  Wt 167 lb (75.751 kg) General Appearance: Well nourished, in no apparent distress. Eyes: PERRLA, EOMs. Sinuses: No Frontal/maxillary tenderness ENT/Mouth: Ext aud canals clear, normal light reflex with TMs without erythema, bulging. Post pharynx without erythema, swelling, exudate.  Respiratory: CTAB Cardio: RRR, no murmurs, rubs or gallops. Peripheral pulses brisk and equal bilaterally, without edema. No aortic or femoral bruits. Abdomen: Soft, with bowl sounds. Nontender, no guarding, rebound. Lymphatics: Non tender without lymphadenopathy.  Musculoskeletal: Full ROM all peripheral extremities, 5/5 strength, and normal gait. Skin: Warm, dry without rashes, lesions, ecchymosis.  Neuro: Cranial nerves intact, reflexes equal bilaterally. Normal  muscle tone, no cerebellar symptoms. Sensation intact.  Pysch: Awake and oriented X 3, normal affect, Insight and Judgment appropriate.   Assessment and Plan: Obesity with co morbidities- long discussion about weight loss, diet, and exercise Hypokalemia- recheck BMP Yeast- diflucan 150mg  Post menopausal spotting- discussed risk of uterine cancer, and she understands risks, suggest [place] or follow up with Dr. Harvie Bridge she states that she would like to wait and will call us or follow up with Dr. Harvie Bridge

## 2013-10-26 NOTE — Patient Instructions (Signed)
Postmenopausal Bleeding  Postmenopausal bleeding is any bleeding a woman has after she has entered into menopause. Menopause is the end of a woman's fertile years. After menopause, a woman no longer ovulates or has menstrual periods.   Postmenopausal bleeding can be caused by various things. Any type of postmenopausal bleeding, even if it appears to be a typical menstrual period, is concerning. This should be evaluated by your health care provider. Any treatment will depend on the cause of the bleeding.  HOME CARE INSTRUCTIONS  Monitor your condition for any changes. The following actions may help to alleviate any discomfort you are experiencing:  · Avoid the use of tampons and douches as directed by your health care provider.   · Change your pads frequently.  · Get regular pelvic exams and Pap tests.  · Keep all follow-up appointments for diagnostic tests as directed by your health care provider.  SEEK MEDICAL CARE IF:   · Your bleeding lasts more than 1 week.  · You have abdominal pain.  · You have bleeding with sexual intercourse.  SEEK IMMEDIATE MEDICAL CARE IF:   · You have a fever, chills, headache, dizziness, muscle aches, and bleeding.  · You have severe pain with bleeding.  · You are passing blood clots.  · You have bleeding and need more than 1 pad an hour.  · You feel faint.  MAKE SURE YOU:  · Understand these instructions.  · Will watch your condition.  · Will get help right away if you are not doing well or get worse.  Document Released: 07/23/2005 Document Revised: 02/02/2013 Document Reviewed: 11/11/2012  [employer]® Patient Information ©2015 [employer]. This information is not intended to replace advice given to you by your health care provider. Make sure you discuss any questions you have with your health care provider.

## 2013-10-27 LAB — IRON AND TIBC
%SAT: 7 % — ABNORMAL LOW (ref 20–55)
Iron: 30 ug/dL — ABNORMAL LOW (ref 42–145)
TIBC: 422 ug/dL (ref 250–470)
UIBC: 392 ug/dL (ref 125–400)

## 2013-10-27 LAB — BASIC METABOLIC PANEL WITH GFR
BUN: 14 mg/dL (ref 6–23)
CALCIUM: 10 mg/dL (ref 8.4–10.5)
CHLORIDE: 99 meq/L (ref 96–112)
CO2: 27 mEq/L (ref 19–32)
CREATININE: 0.61 mg/dL (ref 0.50–1.10)
GFR, Est African American: >89 mL/min
GFR, Est Non African American: >89 mL/min
GLUCOSE: 81 mg/dL (ref 70–99)
Potassium: 4 mEq/L (ref 3.5–5.3)
Sodium: 141 mEq/L (ref 135–145)

## 2013-10-27 LAB — HEPATIC FUNCTION PANEL
ALBUMIN: 4.5 g/dL (ref 3.5–5.2)
ALK PHOS: 64 U/L (ref 39–117)
ALT: 16 U/L (ref 0–35)
AST: 13 U/L (ref 0–37)
BILIRUBIN INDIRECT: 0.2 mg/dL (ref 0.2–1.2)
BILIRUBIN TOTAL: 0.3 mg/dL (ref 0.2–1.2)
Bilirubin, Direct: 0.1 mg/dL (ref 0.0–0.3)
TOTAL PROTEIN: 6.8 g/dL (ref 6.0–8.3)

## 2013-10-27 LAB — MAGNESIUM: MAGNESIUM: 2.1 mg/dL (ref 1.5–2.5)

## 2013-11-04 ENCOUNTER — Other Ambulatory Visit: Payer: Self-pay | Admitting: Physician Assistant

## 2013-11-04 DIAGNOSIS — R232 Flushing: Secondary | ICD-10-CM

## 2013-11-04 MED ORDER — ESTRADIOL-NORETHINDRONE ACET 1-0.5 MG PO TABS: 1.0000 | ORAL_TABLET | Freq: Every day | ORAL | Status: DC

## 2013-11-29 ENCOUNTER — Other Ambulatory Visit: Payer: Self-pay | Admitting: Physician Assistant

## 2013-12-12 ENCOUNTER — Other Ambulatory Visit: Payer: Self-pay

## 2013-12-12 MED ORDER — LOSARTAN POTASSIUM-HCTZ 100-25 MG PO TABS: 1.0000 | ORAL_TABLET | Freq: Every day | ORAL | Status: DC

## 2013-12-22 ENCOUNTER — Ambulatory Visit: Payer: Self-pay | Admitting: Physician Assistant

## 2014-01-09 ENCOUNTER — Other Ambulatory Visit: Payer: Self-pay | Admitting: *Deleted

## 2014-01-09 MED ORDER — LOSARTAN POTASSIUM-HCTZ 100-25 MG PO TABS: 1.0000 | ORAL_TABLET | Freq: Every day | ORAL | Status: DC

## 2014-01-09 MED ORDER — VALACYCLOVIR HCL 500 MG PO TABS: 500.0000 mg | ORAL_TABLET | Freq: Two times a day (BID) | ORAL | Status: DC | PRN

## 2014-01-11 ENCOUNTER — Ambulatory Visit: Payer: Self-pay | Admitting: Physician Assistant

## 2014-02-08 ENCOUNTER — Ambulatory Visit: Payer: Self-pay | Admitting: Physician Assistant

## 2014-02-28 ENCOUNTER — Other Ambulatory Visit: Payer: Self-pay

## 2014-02-28 DIAGNOSIS — Z1231 Encounter for screening mammogram for malignant neoplasm of breast: Secondary | ICD-10-CM

## 2014-03-14 ENCOUNTER — Other Ambulatory Visit: Payer: Self-pay

## 2014-03-14 MED ORDER — LOSARTAN POTASSIUM-HCTZ 100-25 MG PO TABS: 1.0000 | ORAL_TABLET | Freq: Every day | ORAL | Status: DC

## 2014-03-15 ENCOUNTER — Other Ambulatory Visit: Payer: Self-pay | Admitting: Physician Assistant

## 2014-03-21 ENCOUNTER — Ambulatory Visit (INDEPENDENT_AMBULATORY_CARE_PROVIDER_SITE_OTHER): Payer: 59 | Admitting: Physician Assistant

## 2014-03-21 ENCOUNTER — Encounter: Payer: Self-pay | Admitting: Physician Assistant

## 2014-03-21 VITALS — BP 142/84 | HR 102 | Temp 98.0°F | Resp 18 | Ht 60.0 in | Wt 178.0 lb

## 2014-03-21 DIAGNOSIS — Z23 Encounter for immunization: Secondary | ICD-10-CM

## 2014-03-21 DIAGNOSIS — R7309 Other abnormal glucose: Secondary | ICD-10-CM

## 2014-03-21 DIAGNOSIS — R7303 Prediabetes: Secondary | ICD-10-CM

## 2014-03-21 DIAGNOSIS — E669 Obesity, unspecified: Secondary | ICD-10-CM

## 2014-03-21 DIAGNOSIS — I10 Essential (primary) hypertension: Secondary | ICD-10-CM

## 2014-03-21 DIAGNOSIS — E559 Vitamin D deficiency, unspecified: Secondary | ICD-10-CM

## 2014-03-21 DIAGNOSIS — E282 Polycystic ovarian syndrome: Secondary | ICD-10-CM

## 2014-03-21 DIAGNOSIS — E785 Hyperlipidemia, unspecified: Secondary | ICD-10-CM

## 2014-03-21 DIAGNOSIS — E039 Hypothyroidism, unspecified: Secondary | ICD-10-CM

## 2014-03-21 DIAGNOSIS — Z79899 Other long term (current) drug therapy: Secondary | ICD-10-CM

## 2014-03-21 LAB — CBC WITH DIFFERENTIAL/PLATELET
BASOS PCT: 1 % (ref 0–1)
Basophils Absolute: 0.1 10*3/uL (ref 0.0–0.1)
Eosinophils Absolute: 0.2 10*3/uL (ref 0.0–0.7)
Eosinophils Relative: 2 % (ref 0–5)
HCT: 41.5 % (ref 36.0–46.0)
Hemoglobin: 14 g/dL (ref 12.0–15.0)
LYMPHS PCT: 41 % (ref 12–46)
Lymphs Abs: 3.5 10*3/uL (ref 0.7–4.0)
MCH: 29.2 pg (ref 26.0–34.0)
MCHC: 33.7 g/dL (ref 30.0–36.0)
MCV: 86.6 fL (ref 78.0–100.0)
MPV: 8.8 fL — AB (ref 9.4–12.4)
Monocytes Absolute: 0.7 10*3/uL (ref 0.1–1.0)
Monocytes Relative: 8 % (ref 3–12)
NEUTROS ABS: 4.1 10*3/uL (ref 1.7–7.7)
NEUTROS PCT: 48 % (ref 43–77)
PLATELETS: 332 10*3/uL (ref 150–400)
RBC: 4.79 MIL/uL (ref 3.87–5.11)
RDW: 13.7 % (ref 11.5–15.5)
WBC: 8.5 10*3/uL (ref 4.0–10.5)

## 2014-03-21 LAB — HEPATIC FUNCTION PANEL
ALT: 31 U/L (ref 0–35)
AST: 16 U/L (ref 0–37)
Albumin: 3.9 g/dL (ref 3.5–5.2)
Alkaline Phosphatase: 53 U/L (ref 39–117)
BILIRUBIN TOTAL: 0.4 mg/dL (ref 0.2–1.2)
Bilirubin, Direct: 0.1 mg/dL (ref 0.0–0.3)
Indirect Bilirubin: 0.3 mg/dL (ref 0.2–1.2)
Total Protein: 6.3 g/dL (ref 6.0–8.3)

## 2014-03-21 LAB — BASIC METABOLIC PANEL WITH GFR
BUN: 13 mg/dL (ref 6–23)
CALCIUM: 9.7 mg/dL (ref 8.4–10.5)
CO2: 30 mEq/L (ref 19–32)
Chloride: 101 mEq/L (ref 96–112)
Creat: 0.62 mg/dL (ref 0.50–1.10)
GFR, Est African American: >89 mL/min
GFR, Est Non African American: >89 mL/min
GLUCOSE: 73 mg/dL (ref 70–99)
Potassium: 3.7 mEq/L (ref 3.5–5.3)
SODIUM: 138 meq/L (ref 135–145)

## 2014-03-21 LAB — LIPID PANEL
CHOLESTEROL: 178 mg/dL (ref 0–200)
HDL: 54 mg/dL (ref >39)
LDL Cholesterol: 106 mg/dL — ABNORMAL HIGH (ref 0–99)
TRIGLYCERIDES: 88 mg/dL (ref <150)
Total CHOL/HDL Ratio: 3.3 Ratio
VLDL: 18 mg/dL (ref 0–40)

## 2014-03-21 MED ORDER — LORCASERIN HCL 10 MG PO TABS: 10.0000 mg | ORAL_TABLET | Freq: Two times a day (BID) | ORAL | Status: DC

## 2014-03-21 NOTE — Patient Instructions (Signed)
-  Continue medications as prescribed.    GIVE PT FOOD CHOICE LISTS FOR MEAL PLANNING  1)The amount of food you eat is important  -Too much can increase glucose levels and cause you to gain weight (which can  also increase glucose levels.  -Too little can decrease glucose level to unsafe levels (<70) 2)Eat meals and snacks at the same time each day to help your diabetes medication help you.  If you eat at different times each day, then the medication will not be as effective. 3)Do NOT skip meals or eat meals later than usual.  If you skip meals, then your glucose level can go low (<70).  Eating meals later than usual will not help the medication work effectively. 4) Amount of carbohydrates (carbs) per meal  -Breakfast- 30-45 grams of carbs  -Lunch and Dinner- 45-60 grams of carbs  -Snacks- 15-30 grams of carbs 5)Low fat foods have no more than 3 grams of fat per serving.  -Saturated- look for less than 1 gram per serving  -Trans Fat- look for 0 grams per serving 6)Exercise at least 120 minutes per week  Exercise Benefits:   -Lower LDL (Bad cholesterol)   -Lower blood pressure   -Increase HDL (Good cholesterol)   -Strengthen heart, lungs and muscles   -Burn calories and relieve stress   -Sleep better and help you feel better overall  To lower your risk of heart disease, limit your intake of saturated fat and trans fat as much as possible. THE GOOD WHAT IT DOES WHERE IT'S FOUND  MONOUNSATURATED FAT Lowers LDL and maybe raises HDL cholesterol Canola oil, olives, olive oil, peanuts, peanut oil, avocados, nuts  POLYUNSATURATED FAT Lowers LDL cholesterol Corn, safflower, sunflower and soybean oils, nuts, seeds  OMEGA-3 FATTY ACIDS Lowers triglycerides (blood fats) and blood pressure Salmon, mackerel, herring, sardines, flax seed, flaxseed oil, walnuts, soybean oil  THE BAD WHAT IT DOES WHERE IT'S FOUND  SATURATED FAT Raises LDL (bad) cholesterol Butter, shortening, lard, red meat, cheese,  whole milk, ice cream, coconut and palm oils  TRANS FAT Raises LDL cholesterol, lowers HDL (good) cholesterol Fried foods, some stick margarines, some cookies and crackers (look for hydrogenated fat on the ingredient list)  CHOLESTEROL FROM FOOD Too much may raise cholesterol levels Meat, poultry, seafood, eggs, milk, cheese, yogurt, butter   Plate Method (How much food of each food group) 1) Fill one half of your plate with nonstarchy vegetables: lettuce, broccoli, green beans, spinach, carrots or peppers. 2) Fill one quarter with protein: chicken, [place], fish, lean meat, eggs or tofu. 3) Fill one quarter with a nutritious carbohydrate food: brown rice, whole-wheat pasta, whole-wheat bread, peas, or corn.  Choose whole-wheat carbs for extra nutrition.  Controlling carbs helps you control your blood glucose. 4) Include a small piece of fruit at each meal, as well as 8 ounces of lowfat milk or yogurt. 5) Add 1-2 teaspoons of heart-healthy fat, such as olive or canola oil, trans fat-free margarine, avocado, nuts or seeds.

## 2014-03-21 NOTE — Progress Notes (Signed)
Assessment and Plan:  1. Essential hypertension Continue medication (Hyzaar) as prescribed, monitor blood pressure at home.  Reminder to go to the ER if any CP, SOB, nausea, dizziness, severe HA, changes vision/speech, left arm numbness and tingling, and jaw pain.  2. Prediabetes & PCOS Continue Invokana 300mg  and Metformin 2000mg  as prescribed.  Continue diet and exercise. Check A1C - Hemoglobin A1c - Insulin, fasting  3. Hyperlipidemia Continue diet and exercise. Check cholesterol.  - Lipid panel  4. Hypothyroidism, unspecified hypothyroidism type Check TSH level, continue medications the same, reminded to take on an empty stomach 30-10mins before food. Currently taking Levothyroxine 16mcg daily except 1/2 tablet only on Tuesdays, Thursdays, and Saturdays. - TSH  5. Vitamin D deficiency Check level.  Continue calcium and vitamin D OTC. - Vit D  25 hydroxy (rtn osteoporosis monitoring)  6. Obesity Continue Belviq as prescribed-  Lorcaserin HCl (BELVIQ) 10 MG TABS; Take 10 mg by mouth 2 (two) times daily.  Dispense: 60 tablet; Refill: 2  7. Encounter for long-term (current) use of medications Check kidney and liver function and will monitor. - CBC with Differential - BASIC METABOLIC PANEL WITH GFR - Hepatic function panel  8. Need for Zostavax administration Shingles vaccine given in office.  Patient gave verbal consent and states she has funds in flex spending account. - Varicella-zoster vaccine subcutaneous  9. Dry skin in ears- Can use mineral oil or cooking oil and put a couple drops in a day to help with dry skin.  Avoid Q-Tips and "bobby pins".  The scratching can cause secondary infection.    Discussed medication effects and SE's.  Pt agreed to treatment plan.   Continue diet and meds as discussed. Further disposition pending results of labs. Please follow up in 3 months.    HPI An African American 53 y.o. female  presents for 3 month follow up with hypertension,  hyperlipidemia, prediabetes, hypothyroidism and vitamin D.  Her blood pressure has not been controlled at home, today their BP is BP: (!) 142/84 mmHg She does not workout.  She states she had 3 deaths in the family and has not been following diet and exercise routine.  She denies chest pain, shortness of breath, dizziness.   She is not on cholesterol medication and denies myalgias. Her cholesterol is not at goal. The cholesterol last visit was:  No results found for: CHOL, HDL, LDLCALC, LDLDIRECT, TRIG, CHOLHDL  She has not been working on diet and exercise for prediabetes, and denies increased appetite, polydipsia and polyuria. Last A1C in the office was: No results found for: HGBA1C  Currently manages prediabetes with?    Invokana 300mg  and metformin 2000mg    Number of meals per day? 3-4  B- protein drink or eggs and bacon (weekend- pancakes and potatoes)  L- fast food  D- fast food and home cooked meals on weekends  Snacks? Popcorn, chips, yogurt, apple  Beverages?  Water, sweetened tea (only weekend) Up to date Eye Exam? December 2014  Patient is on Vitamin D supplement- on vitamin D and calcium OTC.  Lab Results  Component Value Date   VD25OH 62 09/15/2013     Hypothyroidism- Synthroid 179mcg daily - 1 tablet daily except for 1/2 tablet only on Tuesdays, Thursdays, Saturdays. Hot Flashes [ X] Weight Gain/Loss [ ]  Fatigue [ ]  Difficulty Swallowing [ ]  Voice Changes [ ]  Palpitations [ ]  Hot/Cold Intolerance [ ]   Skin/Hair Texture Changes [ ]  Tremors [ ]  Depression [ ]  Anxiety [ ]  Stress [  X]  Obesity - Taking Belviq and was exercising 3 days a week.  States with recent deaths in family and holidays, she would like to continue Belviq.  She states she did not notice a difference at first with medication, but she now does (less of appetite/cravings).    She does also have itchy ears that started a couple months ago.  She states they sometimes feel blocked or sounds like she is wearing  ear plugs.  She does admit to using Q-Tips and bobby pins to help scratch her ears.      She does report occasional constipation and is taking Benefiber once a day.  She realizes that she needs to take more during the day to help with constipation.  Vaginal Spotting- Patient states she saw Dr. Servando Salina 2 weeks ago.  She states she got pap and per pt Dr. Garwin Brothers said everything looked normal.  However, there is no visit in chart review for Dr. Garwin Brothers.  Last seen per chart review 04/12/2003.  They might have changed EMRs.  Will have Wells Guiles call to see when last appt was.    Current Medications:  Current Outpatient Prescriptions on File Prior to Visit  Medication Sig Dispense Refill  . Aspirin (STANBACK HEADACHE POWDER PO) Take by mouth.    . Calcium Carb-Cholecalciferol (CALCIUM 1000 + D) 1000-800 MG-UNIT TABS Take 1 tablet by mouth daily.    . Cholecalciferol (VITAMIN D) 2000 UNITS tablet Take 2,000 Units by mouth 2 (two) times daily.      Marland Kitchen estradiol-norethindrone (ACTIVELLA) 1-0.5 MG per tablet Take 1 tablet by mouth  daily 84 tablet 0  . fluconazole (DIFLUCAN) 150 MG tablet Take 1 tab and repeat as needed for vaginal yeast. 3 tablet 1  . GARLIC PO Take 1 tablet by mouth 2 (two) times daily.      . INVOKANA 300 MG TABS TAKE 1 TABLET BY MOUTH EVERY DAY 30 tablet PRN  . levothyroxine (SYNTHROID, LEVOTHROID) 100 MCG tablet Take 1 tablet (100 mcg total) by mouth daily. 90 tablet 1  . Lorcaserin HCl (BELVIQ) 10 MG TABS Take 10 mg by mouth 2 (two) times daily. 60 tablet 2  . losartan-hydrochlorothiazide (HYZAAR) 100-25 MG per tablet Take 1 tablet by mouth daily. 90 tablet 3  . LYSINE PO Take 1 tablet by mouth at bedtime.      . magnesium citrate SOLN Take 1 Bottle by mouth as needed.    . metFORMIN (GLUCOPHAGE) 1000 MG tablet Take 1 tablet twice a day   for diabetes 180 tablet 2  . Multiple Vitamins-Minerals (MULTIVITAMIN WITH MINERALS) tablet Take 1 tablet by mouth daily.      .  pantoprazole (PROTONIX) 40 MG tablet     . temazepam (RESTORIL) 30 MG capsule TAKE 1 CAPSULE BY MOUTH ONCE DAILY AT BEDTIME 90 capsule 0  . valACYclovir (VALTREX) 500 MG tablet Take 1 tablet (500 mg total) by mouth 2 (two) times daily as needed. 180 tablet 0   No current facility-administered medications on file prior to visit.   Medical History:  Past Medical History  Diagnosis Date  . Constipation, chronic   . PCOS (polycystic ovarian syndrome)     takes metformin for this  . Hypertension   . Fibroid uterus   . Diverticulitis   . Migraine   . History of gallstones   . Diverticulosis   . Type II or unspecified type diabetes mellitus without mention of complication, not stated as uncontrolled   . Thyroid  disease   . Hypothyroidism   . Hyperlipidemia   . Arthritis   . Prediabetes    Allergies: No Known Allergies  Review of Systems: [X]  = complains of  [ ]  = denies General: Fatigue [ ]  Fever [ ]  Chills Valu.Nieves ] Weakness [ ]   Insomnia [ ]  Eyes: Redness [ ]  Blurred vision [ ]  Diplopia [ ]   ENT: Congestion [ ]  Sinus Pain [ ]  Post Nasal Drip [ ]  Sore Throat [ ]  Earache [ ]   Cardiac: Chest pain/pressure [ ]  SOB [ ]  Orthopnea [ ]   Palpitations [ ]   Paroxysmal nocturnal dyspnea[ ]  Claudication [ ]  Edema [ ]   Pulmonary: Cough [ ]  Wheezing[ ]   SOB [ ]   Snoring [ ]   GI: Nausea [ ]  Vomiting[ ]  Dysphagia[ ]  Heartburn[ ]  Abdominal pain [ ]  Constipation [ X]; Diarrhea [ ] ; BRBPR [ ]  Melena[ ]  GU: Hematuria[ ]  Dysuria [ ]  Nocturia[ ]  Urgency [ ]   Hesitancy [ ]  Discharge [ ]  Neuro: Headaches[ ]  Vertigo[ ]  Paresthesias[ ]  Spasm [ ]  Speech changes [ ]  Incoordination [ ]   Ortho: Arthritis [ ]  Joint pain [ ]  Muscle pain [ ]  Joint swelling [ ]  Back Pain [ ]  Skin:  Rash [ ]   Pruritis [ ]  Change in skin lesion [ ]   Psych: Depression[ ]  Anxiety[ ]  Confusion [ ]  Memory loss [ ]   Heme/Lypmh: Bleeding [ ]  Bruising [ ]  Enlarged lymph nodes [ ]   Endocrine: Visual blurring [ ]  Paresthesia [ ]  Polyuria [ ]   Polydypsea [ ]    Heat/cold intolerance [ ]  Hypoglycemia [ ]  Hot Flashes [X]   Family history- Review and unchanged Social history- Review and unchanged Physical Exam: BP 142/84 mmHg  Pulse 102  Temp(Src) 98 F (36.7 C) (Temporal)  Resp 18  Ht 5' (1.524 m)  Wt 178 lb (80.74 kg)  BMI 34.76 kg/m2  BP rechecked was the same. Wt Readings from Last 3 Encounters:  03/21/14 178 lb (80.74 kg)  10/26/13 167 lb (75.751 kg)  09/15/13 167 lb (75.751 kg)  Vitals Reviewed. General Appearance: Well nourished, in no apparent distress. Obese. Eyes: PERRLA, EOMs, conjunctiva no swelling or erythema Sinuses: No Frontal/maxillary tenderness ENT/Mouth: Ext aud canals clear, except dry skin bilaterally, TMs without erythema, bulging. No erythema, swelling, or exudate on post pharynx.  Tonsils not swollen or erythematous. Hearing normal.  Neck: Supple, thyroid normal.  Respiratory: Respiratory effort normal, CTAB.  No w/r/r or stridor.  Cardio: RRR with no MRGs. Brisk peripheral pulses without edema.  Abdomen: Soft, + normal BS.  Non tender, no guarding, rebound, hernias, masses. Lymphatics: Non tender without lymphadenopathy.  Musculoskeletal: Full ROM, 5/5 strength, normal gait.  Skin: Warm, dry without rashes, lesions, ecchymosis.  Neuro: Cranial nerves intact. Normal muscle tone, no cerebellar symptoms. Sensation intact.  Psych: Awake and oriented X 3, normal affect, Insight and Judgment appropriate.    Elleah Hemsley, Stephani Police, PA-C 4:32 PM [facility]

## 2014-03-22 ENCOUNTER — Ambulatory Visit: Payer: Self-pay | Admitting: Physician Assistant

## 2014-03-22 LAB — INSULIN, FASTING: Insulin fasting, serum: 15.4 u[IU]/mL (ref 2.0–19.6)

## 2014-03-22 LAB — VITAMIN D 25 HYDROXY (VIT D DEFICIENCY, FRACTURES): Vit D, 25-Hydroxy: 52 ng/mL (ref 30–100)

## 2014-03-22 LAB — HEMOGLOBIN A1C
HEMOGLOBIN A1C: 5.5 % (ref <5.7)
Mean Plasma Glucose: 111 mg/dL (ref <117)

## 2014-03-22 LAB — TSH: TSH: 0.972 u[IU]/mL (ref 0.350–4.500)

## 2014-03-24 ENCOUNTER — Other Ambulatory Visit: Payer: Self-pay | Admitting: Internal Medicine

## 2014-03-24 MED ORDER — PREDNISONE 20 MG PO TABS: ORAL_TABLET | ORAL | Status: DC

## 2014-03-24 NOTE — Progress Notes (Signed)
Patient called with c/o reaction to recent Zostavax -  Given Rx Prednisone 20 mg #13 taper

## 2014-03-27 ENCOUNTER — Other Ambulatory Visit: Payer: Self-pay | Admitting: Physician Assistant

## 2014-03-28 ENCOUNTER — Other Ambulatory Visit: Payer: Self-pay

## 2014-03-28 MED ORDER — TEMAZEPAM 30 MG PO CAPS: ORAL_CAPSULE | ORAL | Status: DC

## 2014-03-28 MED ORDER — VALACYCLOVIR HCL 500 MG PO TABS: 500.0000 mg | ORAL_TABLET | Freq: Two times a day (BID) | ORAL | Status: DC | PRN

## 2014-03-29 ENCOUNTER — Other Ambulatory Visit: Payer: Self-pay

## 2014-03-29 MED ORDER — PANTOPRAZOLE SODIUM 40 MG PO TBEC: DELAYED_RELEASE_TABLET | ORAL | Status: DC

## 2014-03-31 ENCOUNTER — Ambulatory Visit
Admission: RE | Admit: 2014-03-31 | Discharge: 2014-03-31 | Disposition: A | Discharge status: Home / Self Care | Payer: 59 | Source: Ambulatory Visit

## 2014-03-31 DIAGNOSIS — Z1231 Encounter for screening mammogram for malignant neoplasm of breast: Secondary | ICD-10-CM

## 2014-04-10 ENCOUNTER — Other Ambulatory Visit: Payer: Self-pay | Admitting: Physician Assistant

## 2014-04-10 ENCOUNTER — Encounter: Payer: Self-pay | Admitting: Physician Assistant

## 2014-04-10 MED ORDER — HYOSCYAMINE SULFATE 0.125 MG SL SUBL: 0.1250 mg | SUBLINGUAL_TABLET | SUBLINGUAL | Status: DC | PRN

## 2014-04-10 MED ORDER — AZITHROMYCIN 250 MG PO TABS: ORAL_TABLET | ORAL | Status: DC

## 2014-04-10 MED ORDER — PROMETHAZINE HCL 25 MG PO TABS: 25.0000 mg | ORAL_TABLET | Freq: Four times a day (QID) | ORAL | Status: DC | PRN

## 2014-04-10 MED ORDER — FLUCONAZOLE 150 MG PO TABS: 150.0000 mg | ORAL_TABLET | Freq: Every day | ORAL | Status: DC

## 2014-04-10 MED ORDER — CIPROFLOXACIN HCL 500 MG PO TABS: 500.0000 mg | ORAL_TABLET | Freq: Two times a day (BID) | ORAL | Status: DC

## 2014-04-10 MED ORDER — SCOPOLAMINE 1 MG/3DAYS TD PT72: 1.0000 | MEDICATED_PATCH | TRANSDERMAL | Status: DC

## 2014-04-10 NOTE — Progress Notes (Signed)
Patient going on cruise and would like medication called in.

## 2014-05-16 ENCOUNTER — Ambulatory Visit (INDEPENDENT_AMBULATORY_CARE_PROVIDER_SITE_OTHER): Payer: 59 | Admitting: Internal Medicine

## 2014-05-16 ENCOUNTER — Ambulatory Visit (INDEPENDENT_AMBULATORY_CARE_PROVIDER_SITE_OTHER): Payer: 59

## 2014-05-16 VITALS — BP 135/80 | HR 85 | Temp 98.5°F | Resp 18 | Ht 59.5 in | Wt 176.0 lb

## 2014-05-16 DIAGNOSIS — M79602 Pain in left arm: Secondary | ICD-10-CM

## 2014-05-16 DIAGNOSIS — I4891 Unspecified atrial fibrillation: Secondary | ICD-10-CM

## 2014-05-16 DIAGNOSIS — I10 Essential (primary) hypertension: Secondary | ICD-10-CM

## 2014-05-16 MED ORDER — HYDROCODONE-ACETAMINOPHEN 5-325 MG PO TABS: 1.0000 | ORAL_TABLET | Freq: Every evening | ORAL | Status: DC | PRN

## 2014-05-16 NOTE — Patient Instructions (Addendum)
You can take ibuprofen 600mg , do not take anymore than 4 times per day.    Perform these varying exercises for this shoulder pain.  Return to the clinic in 3 days.  If you are witnessing improvement of your pain, then call in to cancel.    Shoulder Exercises EXERCISES  RANGE OF MOTION (ROM) AND STRETCHING EXERCISES These exercises may help you when beginning to rehabilitate your injury. Your symptoms may resolve with or without further involvement from your physician, physical therapist or athletic trainer. While completing these exercises, remember:   Restoring tissue flexibility helps normal motion to return to the joints. This allows healthier, less painful movement and activity.  An effective stretch should be held for at least 30 seconds.  A stretch should never be painful. You should only feel a gentle lengthening or release in the stretched tissue. ROM - Pendulum  Bend at the waist so that your right / left arm falls away from your body. Support yourself with your opposite hand on a solid surface, such as a table or a countertop.  Your right / left arm should be perpendicular to the ground. If it is not perpendicular, you need to lean over farther. Relax the muscles in your right / left arm and shoulder as much as possible.  Gently sway your hips and trunk so they move your right / left arm without any use of your right / left shoulder muscles.  Progress your movements so that your right / left arm moves side to side, then forward and backward, and finally, both clockwise and counterclockwise.  Complete __________ repetitions in each direction. Many people use this exercise to relieve discomfort in their shoulder as well as to gain range of motion. Repeat __________ times. Complete this exercise __________ times per day. STRETCH - Flexion, Standing  Stand with good posture. With an underhand grip on your right / left hand and an overhand grip on the opposite hand, grasp a  broomstick or cane so that your hands are a little more than shoulder-width apart.  Keeping your right / left elbow straight and shoulder muscles relaxed, push the stick with your opposite hand to raise your right / left arm in front of your body and then overhead. Raise your arm until you feel a stretch in your right / left shoulder, but before you have increased shoulder pain.  Try to avoid shrugging your right / left shoulder as your arm rises by keeping your shoulder blade tucked down and toward your mid-back spine. Hold __________ seconds.  Slowly return to the starting position. Repeat __________ times. Complete this exercise __________ times per day. STRETCH - Internal Rotation  Place your right / left hand behind your back, palm-up.  Throw a towel or belt over your opposite shoulder. Grasp the towel/belt with your right / left hand.  While keeping an upright posture, gently pull up on the towel/belt until you feel a stretch in the front of your right / left shoulder.  Avoid shrugging your right / left shoulder as your arm rises by keeping your shoulder blade tucked down and toward your mid-back spine.  Hold __________. Release the stretch by lowering your opposite hand. Repeat __________ times. Complete this exercise __________ times per day. STRETCH - External Rotation and Abduction  Stagger your stance through a doorframe. It does not matter which foot is forward.  As instructed by your physician, physical therapist or athletic trainer, place your hands:  And forearms above your head and  on the door frame.  And forearms at head-height and on the door frame.  At elbow-height and on the door frame.  Keeping your head and chest upright and your stomach muscles tight to prevent over-extending your low-back, slowly shift your weight onto your front foot until you feel a stretch across your chest and/or in the front of your shoulders.  Hold __________ seconds. Shift your weight  to your back foot to release the stretch. Repeat __________ times. Complete this stretch __________ times per day.  STRENGTHENING EXERCISES  These exercises may help you when beginning to rehabilitate your injury. They may resolve your symptoms with or without further involvement from your physician, physical therapist or athletic trainer. While completing these exercises, remember:   Muscles can gain both the endurance and the strength needed for everyday activities through controlled exercises.  Complete these exercises as instructed by your physician, physical therapist or athletic trainer. Progress the resistance and repetitions only as guided.  You may experience muscle soreness or fatigue, but the pain or discomfort you are trying to eliminate should never worsen during these exercises. If this pain does worsen, stop and make certain you are following the directions exactly. If the pain is still present after adjustments, discontinue the exercise until you can discuss the trouble with your clinician.  If advised by your physician, during your recovery, avoid activity or exercises which involve actions that place your right / left hand or elbow above your head or behind your back or head. These positions stress the tissues which are trying to heal. STRENGTH - Scapular Depression and Adduction  With good posture, sit on a firm chair. Supported your arms in front of you with pillows, arm rests or a table top. Have your elbows in line with the sides of your body.  Gently draw your shoulder blades down and toward your mid-back spine. Gradually increase the tension without tensing the muscles along the top of your shoulders and the back of your neck.  Hold for __________ seconds. Slowly release the tension and relax your muscles completely before completing the next repetition.  After you have practiced this exercise, remove the arm support and complete it in standing as well as sitting. Repeat  __________ times. Complete this exercise __________ times per day.  STRENGTH - External Rotators  Secure a rubber exercise band/tubing to a fixed object so that it is at the same height as your right / left elbow when you are standing or sitting on a firm surface.  Stand or sit so that the secured exercise band/tubing is at your side that is not injured.  Bend your elbow 90 degrees. Place a folded towel or small pillow under your right / left arm so that your elbow is a few inches away from your side.  Keeping the tension on the exercise band/tubing, pull it away from your body, as if pivoting on your elbow. Be sure to keep your body steady so that the movement is only coming from your shoulder rotating.  Hold __________ seconds. Release the tension in a controlled manner as you return to the starting position. Repeat __________ times. Complete this exercise __________ times per day.  STRENGTH - Supraspinatus  Stand or sit with good posture. Grasp a __________ weight or an exercise band/tubing so that your hand is "thumbs-up," like when you shake hands.  Slowly lift your right / left hand from your thigh into the air, traveling about 30 degrees from straight out at your side.  Lift your hand to shoulder height or as far as you can without increasing any shoulder pain. Initially, many people do not lift their hands above shoulder height.  Avoid shrugging your right / left shoulder as your arm rises by keeping your shoulder blade tucked down and toward your mid-back spine.  Hold for __________ seconds. Control the descent of your hand as you slowly return to your starting position. Repeat __________ times. Complete this exercise __________ times per day.  STRENGTH - Shoulder Extensors  Secure a rubber exercise band/tubing so that it is at the height of your shoulders when you are either standing or sitting on a firm arm-less chair.  With a thumbs-up grip, grasp an end of the band/tubing in  each hand. Straighten your elbows and lift your hands straight in front of you at shoulder height. Step back away from the secured end of band/tubing until it becomes tense.  Squeezing your shoulder blades together, pull your hands down to the sides of your thighs. Do not allow your hands to go behind you.  Hold for __________ seconds. Slowly ease the tension on the band/tubing as you reverse the directions and return to the starting position. Repeat __________ times. Complete this exercise __________ times per day.  STRENGTH - Scapular Retractors  Secure a rubber exercise band/tubing so that it is at the height of your shoulders when you are either standing or sitting on a firm arm-less chair.  With a palm-down grip, grasp an end of the band/tubing in each hand. Straighten your elbows and lift your hands straight in front of you at shoulder height. Step back away from the secured end of band/tubing until it becomes tense.  Squeezing your shoulder blades together, draw your elbows back as you bend them. Keep your upper arm lifted away from your body throughout the exercise.  Hold __________ seconds. Slowly ease the tension on the band/tubing as you reverse the directions and return to the starting position. Repeat __________ times. Complete this exercise __________ times per day. STRENGTH - Scapular Depressors  Find a sturdy chair without wheels, such as a from a dining room table.  Keeping your feet on the floor, lift your bottom from the seat and lock your elbows.  Keeping your elbows straight, allow gravity to pull your body weight down. Your shoulders will rise toward your ears.  Raise your body against gravity by drawing your shoulder blades down your back, shortening the distance between your shoulders and ears. Although your feet should always maintain contact with the floor, your feet should progressively support less body weight as you get stronger.  Hold __________ seconds. In a  controlled and slow manner, lower your body weight to begin the next repetition. Repeat __________ times. Complete this exercise __________ times per day.  Document Released: 02/26/2005 Document Revised: 07/07/2011 Document Reviewed: 07/27/2008 [facility] Patient Information 2015 [place], [place]. This information is not intended to replace advice given to you by your health care provider. Make sure you discuss any questions you have with your health care provider. You have a referral to cardiology, so await that call.    Shoulder Pain The shoulder is the joint that connects your arm to your body. Muscles and band-like tissues that connect bones to muscles (tendons) hold the joint together. Shoulder pain is felt if an injury or medical problem affects one or more parts of the shoulder. HOME CARE   Put ice on the sore area.  Put ice in a plastic bag.  Place a towel between your skin and the bag.  Leave the ice on for 15-20 minutes, 03-04 times a day for the first 2 days.  Stop using cold packs if they do not help with the pain.  If you were given something to keep your shoulder from moving (sling; shoulder immobilizer), wear it as told. Only take it off to shower or bathe.  Move your arm as little as possible, but keep your hand moving to prevent puffiness (swelling).  Squeeze a soft ball or foam pad as much as possible to help prevent swelling.  Take medicine as told by your doctor. GET HELP IF:  You have progressing new pain in your arm, hand, or fingers.  Your hand or fingers get cold.  Your medicine does not help lessen your pain. GET HELP RIGHT AWAY IF:   Your arm, hand, or fingers are numb or tingling.  Your arm, hand, or fingers are puffy (swollen), painful, or turn white or blue. MAKE SURE YOU:   Understand these instructions.  Will watch your condition.  Will get help right away if you are not doing well or get worse. Document Released: 10/01/2007 Document Revised:  08/29/2013 Document Reviewed: 10/27/2011 [facility] Patient Information 2015 [place], [place]. This information is not intended to replace advice given to you by your health care provider. Make sure you discuss any questions you have with your health care provider.

## 2014-05-16 NOTE — Progress Notes (Signed)
MRN: [id number] DOB: 10-28-1960  Subjective:   Chief Complaint  Patient presents with  . hand/arm pain    left    Kelly Goodwin is a 54 y.o. female with PMH of HTN, migraine, DM2 presenting for chief complaint of left arm pain that started yesterday evening.  Patient states that the pain beginx at the left hand and radiates up to the left shoulder and began after she climbed a flight of stairs while carrying bags in her right hand.  She denies any trauma.  Patient states that this pain caused weakness in hand along with a hot sensation.  Her left forearm feels painful to the touch, though she has not noticed any erythema, swelling, or warmth to the area.  There is no associated chest pain, palpitations, diaphoresis, or sob with the left arm pain.  Resting has not relieved the pain.  She took [place] medication which helped.  She was able to sleep stay asleep, but she noticed the pain again.  She has never had this pain before.  She denies any hx of trauma.  She notes that the day before, she was carrying a pile of clothes at her left hand, though she never noticed any pain.  She has full mobilization of her neck.  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  She denies cough, fever, or recent travel.  She uses estrogen to temper her hot flashes and has hx of migraines.  She is a Network engineer, with lite typing.  She had shingles vaccine 2 months ago.  She has known atrial fibrillation that is followed by her PCP.  She is given an EKG once per year, though last one was more than a year ago.  She has no cardiologist. She notes that she had tooth extraction last week.  Given opiate for pain relief.  No antibiotic medication given.        Kelly Goodwin has a current medication list which includes the following prescription(s): aspirin, calcium carb-cholecalciferol, calcium-vitamin d, estradiol-norethindrone, garlic, hyoscyamine, invokana, levothyroxine, losartan-hydrochlorothiazide, lysine, magnesium  citrate, metformin, multivitamin with minerals, pantoprazole, temazepam, and valacyclovir.  She has No Known Allergies.  Kelly Goodwin  has a past medical history of Constipation, chronic; PCOS (polycystic ovarian syndrome); Hypertension; Fibroid uterus; Diverticulitis; Migraine; History of gallstones; Diverticulosis; Type II or unspecified type diabetes mellitus without mention of complication, not stated as uncontrolled; Thyroid disease; Hypothyroidism; Hyperlipidemia; Arthritis; and Prediabetes. Also  has past surgical history that includes Breast reduction surgery (1988); Cholecystectomy (1995); Tonsillectomy and adenoidectomy (1990's); LASIK (Left); Refractive surgery (Left); Breast surgery; and Eye surgery.  ROS As in subjective.  Objective:   Vitals: BP 135/80 mmHg  Pulse 85  Temp(Src) 98.5 F (36.9 C) (Oral)  Resp 18  Ht 4' 11.5" (1.511 m)  Wt 176 lb (79.833 kg)  BMI 34.97 kg/m2  SpO2 99%  Physical Exam  Constitutional: She is oriented to person, place, and time.  HENT:  Head: Normocephalic and atraumatic.  Neck: Normal range of motion and full passive range of motion without pain. Neck supple. No spinous process tenderness and no muscular tenderness present. Carotid bruit is not present. No rigidity.  Cardiovascular: Normal rate, regular rhythm and normal heart sounds.  Exam reveals no gallop and no friction rub.   No murmur heard. Pulmonary/Chest: Effort normal and breath sounds normal. No accessory muscle usage. No apnea. No respiratory distress. She has no decreased breath sounds. She has no wheezes.  Abdominal: Normal appearance. She exhibits no abdominal bruit.  Musculoskeletal:       Right shoulder: She exhibits normal range of motion, no swelling, no crepitus and no spasm.       Left shoulder: She exhibits decreased range of motion (with external rotation.  Normal internal rotation.  Negative Neers.  ) and tenderness (Tenderness at the posterior shoulder).       Left elbow:  She exhibits normal range of motion. No radial head, no medial epicondyle, no lateral epicondyle and no olecranon process tenderness noted.       Left wrist: She exhibits normal range of motion and no tenderness.  Left arm has no erythema, swelling, or warmth throughout the left arm.  Reproducing radiating pain to the left hand with palpation at the posterior shoulder just proximal to the glenohumeral joint.  No bony tenderness or abnormality.  No cervical spinous tenderness.  No tenderness along the epicondyles or at the olecranon.  Decreased hand grip strength at 2/5.  Negative Tinel and Phalen's.  Normal resisted extension and flexion at the elbow.  Normal resisted pronation and supination.    Neurological: She is alert and oriented to person, place, and time. She displays facial symmetry.  Skin: Skin is warm and dry.  Psychiatric: Mood and affect normal.   [facility] reading (PRIMARY) by  Dr. Elder Cyphers: Negative   EKG reviewed by Dr. Elder Cyphers: No acute changes.  Assessment and Plan :  54 year old female is here today for a chief complaint of left arm pain.  Origin of pain appeared unclear, and cardiovascular acute events must r/o.  This appears to be normal, though cardiovascular workup is needed for hypertension and atrial fibrillation.  Through discussion, patient has been aware again, of the risk of estrogen intake with PMH of HTN, AF, DM2, and age.   Diff dx: Muscle strain, rotator tear, impingement, shingles onset.  We will treat as muscle strain at this time, and she will rtc in 3 days for no improvement.    Left arm pain - Plan: EKG 12-Lead, DG Shoulder Left, Ambulatory referral to Cardiology, HYDROcodone-acetaminophen (NORCO) 5-325 MG per tablet, CANCELED: POCT CBC, CANCELED: D-dimer, quantitative  Essential hypertension - Plan: Ambulatory referral to Cardiology  Atrial fibrillation, unspecified - Plan: Ambulatory referral to Cardiology  Kelly Drape, PA-C [facility] 1/20/20168:37 PM

## 2014-05-18 ENCOUNTER — Telehealth: Payer: Self-pay

## 2014-05-18 NOTE — Telephone Encounter (Signed)
The patient called to say that Kelly Goodwin was canceling her appointment for tomorrow, 05/19/14.  I didn't see a future appt for that date, so I assumed Kelly Goodwin was told to come in to 102 if needed.  Kelly Goodwin wanted a note entered in the system just in case.

## 2014-06-06 ENCOUNTER — Other Ambulatory Visit: Payer: Self-pay | Admitting: Physician Assistant

## 2014-06-27 ENCOUNTER — Ambulatory Visit: Payer: Self-pay | Admitting: Physician Assistant

## 2014-07-05 ENCOUNTER — Other Ambulatory Visit: Payer: Self-pay | Admitting: Internal Medicine

## 2014-07-05 ENCOUNTER — Ambulatory Visit (INDEPENDENT_AMBULATORY_CARE_PROVIDER_SITE_OTHER): Payer: 59 | Admitting: Internal Medicine

## 2014-07-05 ENCOUNTER — Encounter: Payer: Self-pay | Admitting: Internal Medicine

## 2014-07-05 VITALS — BP 138/72 | HR 98 | Temp 98.0°F | Resp 16 | Ht 60.0 in | Wt 181.0 lb

## 2014-07-05 DIAGNOSIS — I10 Essential (primary) hypertension: Secondary | ICD-10-CM

## 2014-07-05 DIAGNOSIS — E785 Hyperlipidemia, unspecified: Secondary | ICD-10-CM

## 2014-07-05 DIAGNOSIS — E559 Vitamin D deficiency, unspecified: Secondary | ICD-10-CM

## 2014-07-05 DIAGNOSIS — Z79899 Other long term (current) drug therapy: Secondary | ICD-10-CM

## 2014-07-05 DIAGNOSIS — E039 Hypothyroidism, unspecified: Secondary | ICD-10-CM

## 2014-07-05 DIAGNOSIS — R7303 Prediabetes: Secondary | ICD-10-CM

## 2014-07-05 DIAGNOSIS — R7309 Other abnormal glucose: Secondary | ICD-10-CM

## 2014-07-05 NOTE — Patient Instructions (Signed)
   Recommend the book "The END of DIETING" by Dr Joel Fuhrman   & the book "The END of DIABETES " by Dr Joel Fuhrman  At [employer] - get book & Audio CD's      Being diabetic has a  300% increased risk for heart attack, stroke, cancer, and alzheimer- type vascular dementia. It is very important that you work harder with diet by avoiding all foods that are white. Avoid white rice (brown & wild rice is OK), white potatoes (sweetpotatoes in moderation is OK), White bread or wheat bread or anything made out of white flour like bagels, donuts, rolls, buns, biscuits, cakes, pastries, cookies, pizza crust, and pasta (made from white flour & egg whites) - vegetarian pasta or spinach or wheat pasta is OK. Multigrain breads like Arnold's or Pepperidge Farm, or multigrain sandwich thins or flatbreads.  Diet, exercise and weight loss can reverse and cure diabetes in the early stages.  Diet, exercise and weight loss is very important in the control and prevention of complications of diabetes which affects every system in your body, ie. Brain - dementia/stroke, eyes - glaucoma/blindness, heart - heart attack/heart failure, kidneys - dialysis, stomach - gastric paralysis, intestines - malabsorption, nerves - severe painful neuritis, circulation - gangrene & loss of a leg(s), and finally cancer and Alzheimers.    I recommend avoid fried & greasy foods,  sweets/candy, white rice (brown or wild rice or Quinoa is OK), white potatoes (sweet potatoes are OK) - anything made from white flour - bagels, doughnuts, rolls, buns, biscuits,white and wheat breads, pizza crust and traditional pasta made of white flour & egg white(vegetarian pasta or spinach or wheat pasta is OK).  Multi-grain bread is OK - like multi-grain flat bread or sandwich thins. Avoid alcohol in excess. Exercise is also important.    Eat all the vegetables you want - avoid meat, especially red meat and dairy - especially cheese.  Cheese is the most  concentrated form of trans-fats which is the worst thing to clog up our arteries. Veggie cheese is OK which can be found in the fresh produce section at [employer] or [employer] or [employer]     Bad carbs also include fruit juice, alcohol, and sweet tea. These are empty calories that do not signal to your brain that you are full.   Please remember the good carbs are still carbs which convert into sugar. So please measure them out no more than 1/2-1 cup of rice, oatmeal, pasta, and beans  Veggies are however free foods! Pile them on.   Not all fruit is created equal. Please see the list below, the fruit at the bottom is higher in sugars than the fruit at the top. Please avoid all dried fruits.     

## 2014-07-05 NOTE — Progress Notes (Signed)
Patient ID: Kelly Goodwin, female   DOB: November 01, 1960, 54 y.o.   MRN: [id number]  Assessment and Plan:  Hypertension:  -Continue medication -monitor blood pressure at home. -Continue DASH diet -Reminder to go to the ER if any CP, SOB, nausea, dizziness, severe HA, changes vision/speech, left arm numbness and tingling and jaw Goodwin.  Cholesterol - Continue diet and exercise -Check cholesterol.   PreDiabetes without complications -Continue diet and exercise.  -Check A1C  Vitamin D Def -check level -continue medications.   Continue diet and meds as discussed. Further disposition pending results of labs. Discussed med's effects and SE's.    HPI 54 y.o. female  presents for 3 month follow up with hypertension, hyperlipidemia, diabetes and vitamin D deficiency.   Her blood pressure has been controlled at home, today their BP is BP: 138/72 mmHg.She does workout.  Just started going back to zumba and is walking She denies chest Goodwin, shortness of breath, dizziness.   She is not on cholesterol medication and denies myalgias. Her cholesterol is not at goal. The cholesterol was:  03/21/2014: Cholesterol, Total 178; HDL-C 54; LDL (calc) 106*; Triglycerides 88   She has not been working on diet and exercise for prediabetes without complications, she is on bASA, she is on ACE/ARB, and denies  foot ulcerations, hyperglycemia, hypoglycemia , nausea, paresthesia of the feet, polydipsia, polyuria and visual disturbances. Last A1C was: 03/21/2014: Hemoglobin-A1c 5.5   Patient is on Vitamin D supplement. 03/21/2014: VITD 52    Current Medications:  Current Outpatient Prescriptions on File Prior to Visit  Medication Sig Dispense Refill  . Aspirin (STANBACK HEADACHE POWDER PO) Take by mouth.    Marland Kitchen CALCIUM-VITAMIN D PO Take 1 tablet by mouth daily.    Marland Kitchen estradiol-norethindrone (ACTIVELLA) 1-0.5 MG per tablet Take 1 tablet by mouth  daily 84 tablet 0  . GARLIC PO Take 1 tablet by mouth 2 (two) times  daily.      . INVOKANA 300 MG TABS TAKE 1 TABLET BY MOUTH EVERY DAY 30 tablet PRN  . levothyroxine (SYNTHROID, LEVOTHROID) 100 MCG tablet Take 1 tablet by mouth  daily 30 tablet 0  . losartan-hydrochlorothiazide (HYZAAR) 100-25 MG per tablet Take 1 tablet by mouth daily. 90 tablet 3  . LYSINE PO Take 1 tablet by mouth at bedtime.      . metFORMIN (GLUCOPHAGE) 1000 MG tablet Take 1 tablet twice a day   for diabetes 180 tablet 2  . Multiple Vitamins-Minerals (MULTIVITAMIN WITH MINERALS) tablet Take 1 tablet by mouth daily.      . pantoprazole (PROTONIX) 40 MG tablet Take 1 tablet by mouth  every day 90 tablet 3  . temazepam (RESTORIL) 30 MG capsule TAKE 1 CAPSULE BY MOUTH ONCE DAILY AT BEDTIME 90 capsule 3  . valACYclovir (VALTREX) 500 MG tablet Take 1 tablet (500 mg total) by mouth 2 (two) times daily as needed. 180 tablet 3   No current facility-administered medications on file prior to visit.   Medical History:  Past Medical History  Diagnosis Date  . Constipation, chronic   . PCOS (polycystic ovarian syndrome)     takes metformin for this  . Hypertension   . Fibroid uterus   . Diverticulitis   . Migraine   . History of gallstones   . Diverticulosis   . Type II or unspecified type diabetes mellitus without mention of complication, not stated as uncontrolled   . Thyroid disease   . Hypothyroidism   . Hyperlipidemia   .  Arthritis   . Prediabetes    Allergies: No Known Allergies   Review of Systems:  Review of Systems  Constitutional: Negative for fever, chills and diaphoresis.  HENT: Negative for congestion, ear discharge, ear Goodwin, nosebleeds, sore throat and tinnitus.   Eyes: Negative.   Respiratory: Negative.   Cardiovascular: Negative for chest Goodwin, palpitations, orthopnea, leg swelling and PND.  Gastrointestinal: Negative for heartburn, nausea, vomiting, abdominal Goodwin, diarrhea, constipation, blood in stool and melena.  Genitourinary: Negative.   Musculoskeletal:  Negative.   Neurological: Negative.  Negative for weakness and headaches.  Psychiatric/Behavioral: Negative.     Family history- Review and unchanged  Social history- Review and unchanged  Physical Exam: BP 138/72 mmHg  Pulse 98  Temp(Src) 98 F (36.7 C) (Temporal)  Resp 16  Ht 5' (1.524 m)  Wt 181 lb (82.101 kg)  BMI 35.35 kg/m2 Wt Readings from Last 3 Encounters:  07/05/14 181 lb (82.101 kg)  05/16/14 176 lb (79.833 kg)  03/21/14 178 lb (80.74 kg)   General Appearance: Well nourished well developed, non-toxic appearing, in no apparent distress. Eyes: PERRLA, EOMs, conjunctiva no swelling or erythema ENT/Mouth: Ear canals clear with no erythema, swelling, or discharge.  TMs normal bilaterally, oropharynx clear, moist, with no exudate.   Neck: Supple, thyroid normal, no JVD, no cervical adenopathy.  Respiratory: Respiratory effort normal, breath sounds clear A&P, no wheeze, rhonchi or rales noted.  No retractions, no accessory muscle usage Cardio: RRR with no MRGs. No noted edema.  Abdomen: Soft, + BS.  Non tender, no guarding, rebound, hernias, masses. Musculoskeletal: Full ROM, 5/5 strength, Normal gait Skin: Warm, dry without rashes, lesions, ecchymosis.  Neuro: Awake and oriented X 3, Cranial nerves intact. No cerebellar symptoms.  Psych: normal affect, Insight and Judgment appropriate.    FORCUCCI, Dynastee Brummell, PA-C 4:57 PM [facility]

## 2014-07-06 LAB — HEMOGLOBIN A1C
Hgb A1c MFr Bld: 5.6 % (ref <5.7)
MEAN PLASMA GLUCOSE: 114 mg/dL (ref <117)

## 2014-07-06 LAB — CBC WITH DIFFERENTIAL/PLATELET
BASOS ABS: 0.1 10*3/uL (ref 0.0–0.1)
Basophils Relative: 1 % (ref 0–1)
EOS ABS: 0.2 10*3/uL (ref 0.0–0.7)
EOS PCT: 2 % (ref 0–5)
HEMATOCRIT: 44.5 % (ref 36.0–46.0)
Hemoglobin: 14.8 g/dL (ref 12.0–15.0)
Lymphocytes Relative: 31 % (ref 12–46)
Lymphs Abs: 3 10*3/uL (ref 0.7–4.0)
MCH: 29.9 pg (ref 26.0–34.0)
MCHC: 33.3 g/dL (ref 30.0–36.0)
MCV: 89.9 fL (ref 78.0–100.0)
MPV: 9.2 fL (ref 8.6–12.4)
Monocytes Absolute: 0.8 10*3/uL (ref 0.1–1.0)
Monocytes Relative: 8 % (ref 3–12)
Neutro Abs: 5.7 10*3/uL (ref 1.7–7.7)
Neutrophils Relative %: 58 % (ref 43–77)
PLATELETS: 358 10*3/uL (ref 150–400)
RBC: 4.95 MIL/uL (ref 3.87–5.11)
RDW: 13.8 % (ref 11.5–15.5)
WBC: 9.8 10*3/uL (ref 4.0–10.5)

## 2014-07-06 LAB — HEPATIC FUNCTION PANEL
ALT: 14 U/L (ref 0–35)
AST: 13 U/L (ref 0–37)
Albumin: 4.3 g/dL (ref 3.5–5.2)
Alkaline Phosphatase: 62 U/L (ref 39–117)
BILIRUBIN INDIRECT: 0.2 mg/dL (ref 0.2–1.2)
Bilirubin, Direct: 0.1 mg/dL (ref 0.0–0.3)
Total Bilirubin: 0.3 mg/dL (ref 0.2–1.2)
Total Protein: 6.9 g/dL (ref 6.0–8.3)

## 2014-07-06 LAB — BASIC METABOLIC PANEL WITH GFR
BUN: 18 mg/dL (ref 6–23)
CO2: 26 meq/L (ref 19–32)
Calcium: 9.9 mg/dL (ref 8.4–10.5)
Chloride: 100 mEq/L (ref 96–112)
Creat: 0.69 mg/dL (ref 0.50–1.10)
GLUCOSE: 103 mg/dL — AB (ref 70–99)
Potassium: 4.1 mEq/L (ref 3.5–5.3)
SODIUM: 138 meq/L (ref 135–145)

## 2014-07-06 LAB — TSH: TSH: 0.937 u[IU]/mL (ref 0.350–4.500)

## 2014-07-06 LAB — VITAMIN D 25 HYDROXY (VIT D DEFICIENCY, FRACTURES): Vit D, 25-Hydroxy: 42 ng/mL (ref 30–100)

## 2014-07-06 LAB — LIPID PANEL
Cholesterol: 203 mg/dL — ABNORMAL HIGH (ref 0–200)
HDL: 46 mg/dL (ref >=46)
LDL Cholesterol: 128 mg/dL — ABNORMAL HIGH (ref 0–99)
TRIGLYCERIDES: 145 mg/dL (ref <150)
Total CHOL/HDL Ratio: 4.4 Ratio
VLDL: 29 mg/dL (ref 0–40)

## 2014-07-06 LAB — MAGNESIUM: Magnesium: 2 mg/dL (ref 1.5–2.5)

## 2014-07-06 LAB — INSULIN, FASTING: INSULIN FASTING, SERUM: 46.8 u[IU]/mL — AB (ref 2.0–19.6)

## 2014-09-02 ENCOUNTER — Other Ambulatory Visit: Payer: Self-pay | Admitting: Internal Medicine

## 2014-09-20 ENCOUNTER — Other Ambulatory Visit: Payer: Self-pay | Admitting: Internal Medicine

## 2014-09-27 ENCOUNTER — Ambulatory Visit (INDEPENDENT_AMBULATORY_CARE_PROVIDER_SITE_OTHER): Payer: 59 | Admitting: Physician Assistant

## 2014-09-27 ENCOUNTER — Encounter: Payer: Self-pay | Admitting: Physician Assistant

## 2014-09-27 ENCOUNTER — Other Ambulatory Visit: Payer: Self-pay

## 2014-09-27 VITALS — BP 130/78 | HR 88 | Temp 97.7°F | Resp 16 | Ht 60.0 in | Wt 180.0 lb

## 2014-09-27 DIAGNOSIS — E039 Hypothyroidism, unspecified: Secondary | ICD-10-CM

## 2014-09-27 DIAGNOSIS — E282 Polycystic ovarian syndrome: Secondary | ICD-10-CM

## 2014-09-27 DIAGNOSIS — K589 Irritable bowel syndrome without diarrhea: Secondary | ICD-10-CM

## 2014-09-27 DIAGNOSIS — K5730 Diverticulosis of large intestine without perforation or abscess without bleeding: Secondary | ICD-10-CM

## 2014-09-27 DIAGNOSIS — M1990 Unspecified osteoarthritis, unspecified site: Secondary | ICD-10-CM

## 2014-09-27 DIAGNOSIS — R7303 Prediabetes: Secondary | ICD-10-CM

## 2014-09-27 DIAGNOSIS — I10 Essential (primary) hypertension: Secondary | ICD-10-CM

## 2014-09-27 DIAGNOSIS — N809 Endometriosis, unspecified: Secondary | ICD-10-CM | POA: Insufficient documentation

## 2014-09-27 DIAGNOSIS — E669 Obesity, unspecified: Secondary | ICD-10-CM

## 2014-09-27 DIAGNOSIS — E559 Vitamin D deficiency, unspecified: Secondary | ICD-10-CM | POA: Insufficient documentation

## 2014-09-27 DIAGNOSIS — G43809 Other migraine, not intractable, without status migrainosus: Secondary | ICD-10-CM

## 2014-09-27 DIAGNOSIS — Z0000 Encounter for general adult medical examination without abnormal findings: Secondary | ICD-10-CM

## 2014-09-27 DIAGNOSIS — D649 Anemia, unspecified: Secondary | ICD-10-CM

## 2014-09-27 DIAGNOSIS — Z79899 Other long term (current) drug therapy: Secondary | ICD-10-CM

## 2014-09-27 DIAGNOSIS — E785 Hyperlipidemia, unspecified: Secondary | ICD-10-CM

## 2014-09-27 DIAGNOSIS — K5900 Constipation, unspecified: Secondary | ICD-10-CM | POA: Insufficient documentation

## 2014-09-27 LAB — CBC WITH DIFFERENTIAL/PLATELET
BASOS PCT: 0 % (ref 0–1)
Basophils Absolute: 0 10*3/uL (ref 0.0–0.1)
Eosinophils Absolute: 0.3 10*3/uL (ref 0.0–0.7)
Eosinophils Relative: 3 % (ref 0–5)
HCT: 41 % (ref 36.0–46.0)
HEMOGLOBIN: 13.7 g/dL (ref 12.0–15.0)
LYMPHS ABS: 3.1 10*3/uL (ref 0.7–4.0)
Lymphocytes Relative: 33 % (ref 12–46)
MCH: 29.5 pg (ref 26.0–34.0)
MCHC: 33.4 g/dL (ref 30.0–36.0)
MCV: 88.4 fL (ref 78.0–100.0)
MONOS PCT: 9 % (ref 3–12)
MPV: 9 fL (ref 8.6–12.4)
Monocytes Absolute: 0.9 10*3/uL (ref 0.1–1.0)
NEUTROS PCT: 55 % (ref 43–77)
Neutro Abs: 5.2 10*3/uL (ref 1.7–7.7)
Platelets: 350 10*3/uL (ref 150–400)
RBC: 4.64 MIL/uL (ref 3.87–5.11)
RDW: 13.5 % (ref 11.5–15.5)
WBC: 9.5 10*3/uL (ref 4.0–10.5)

## 2014-09-27 MED ORDER — LEVOTHYROXINE SODIUM 100 MCG PO TABS: 100.0000 ug | ORAL_TABLET | Freq: Every day | ORAL | Status: DC

## 2014-09-27 MED ORDER — BELVIQ 10 MG PO TABS: ORAL_TABLET | ORAL | Status: DC

## 2014-09-27 NOTE — Progress Notes (Signed)
Complete Physical  Assessment and Plan: 1. Essential hypertension - continue medications, DASH diet, exercise and monitor at home. Call if greater than 130/80.  - CBC with Differential/Platelet - BASIC METABOLIC PANEL WITH GFR - Hepatic function panel - Urinalysis, Routine w reflex microscopic (not at [facility]) - Microalbumin / creatinine urine ratio - EKG 12-Lead  2. Hypothyroidism, unspecified hypothyroidism type Hypothyroidism-check TSH level, continue medications the same, reminded to take on an empty stomach 30-48mins before food.  - TSH  3. Prediabetes Discussed general issues about diabetes pathophysiology and management., Educational material distributed., Suggested low cholesterol diet., Encouraged aerobic exercise., Discussed foot care., Reminded to get yearly retinal exam. - Hemoglobin A1c - Insulin, fasting - LOW EXTREMITY NEUR EXAM DOCUM  4. Hyperlipidemia -continue medications, check lipids, decrease fatty foods, increase activity.  - Lipid panel  5. PCOS (polycystic ovarian syndrome) Continue metformin/weight loss  6. Irritable bowel syndrome (IBS)  7. Diverticulosis of large intestine without hemorrhage Increase fiber, monitor  8. Other migraine without status migrainosus, not intractable Avoid triggers  9. Vitamin D deficiency - Vit D  25 hydroxy (rtn osteoporosis monitoring)  10. Medication management - Magnesium  11. Obesity Obesity with co morbidities- long discussion about weight loss, diet, and exercise - BELVIQ 10 MG TABS; 1 pill twice a day  Dispense: 60 tablet; Refill: 5  12. Arthritis RICE, NSAIDS, exercises given, if not better get xray and PT referral or ortho referral.   13. Anemia, unspecified anemia type - Vitamin B12 - Iron and TIBC - Ferritin   Discussed med's effects and SE's. Screening labs and tests as requested with regular follow-up as recommended.  HPI 54 y.o. female  presents for a complete physical. Her blood pressure  has been controlled at home, today their BP is BP: 130/78 mmHg She does workout, zumba. She denies chest pain, shortness of breath, dizziness.  She is not on cholesterol medication and denies myalgias. Her cholesterol is not at goal. The cholesterol last visit was:  Lab Results  Component Value Date   CHOL 203* 07/05/2014   HDL 46 07/05/2014   LDLCALC 128* 07/05/2014   TRIG 145 07/05/2014   CHOLHDL 4.4 07/05/2014   She has been working on diet and exercise for diabetes ( has had A1C 6.5 x 2014), her weight is down and she has been on invokana and metformin 2 a day and she is in NON DM range, and denies paresthesia of the feet, polydipsia and polyuria. Last A1C in the office was:  Lab Results  Component Value Date   HGBA1C 5.6 07/05/2014    Patient is on Vitamin D supplement.   BMI is Body mass index is 35.15 kg/(m^2)., she is working on diet and exercise. She is on belviq, but has only been doing 1 a day.   Wt Readings from Last 3 Encounters:  09/27/14 180 lb (81.647 kg)  07/05/14 181 lb (82.101 kg)  05/16/14 176 lb (79.833 kg)   She has had a recent diverticulitis flare and is on ABX for this.  She is on thyroid medication. Her medication was not changed last visit.   Lab Results  Component Value Date   TSH 0.937 07/05/2014   She is on activella for hot flashes.  She uses restoril for sleep.  She is on protonix PRN.  She complains of cough, worse at night. Was on mucinex which helped some, some sneezing. She has not been on xyzal in the past but has not been taking it.  Current Medications:  Current Outpatient Prescriptions on File Prior to Visit  Medication Sig Dispense Refill  . Aspirin (STANBACK HEADACHE POWDER PO) Take by mouth.    Marland Kitchen CALCIUM-VITAMIN D PO Take 1 tablet by mouth daily.    Marland Kitchen estradiol-norethindrone (ACTIVELLA) 1-0.5 MG per tablet Take 1 tablet by mouth  daily 84 tablet 3  . GARLIC PO Take 1 tablet by mouth 2 (two) times daily.      . INVOKANA 300 MG TABS  TAKE 1 TABLET BY MOUTH EVERY DAY 30 tablet PRN  . levothyroxine (SYNTHROID, LEVOTHROID) 100 MCG tablet Take 1 tablet by mouth  daily 30 tablet 0  . losartan-hydrochlorothiazide (HYZAAR) 100-25 MG per tablet Take 1 tablet by mouth daily. 90 tablet 3  . LYSINE PO Take 1 tablet by mouth at bedtime.      . metFORMIN (GLUCOPHAGE) 1000 MG tablet Take 1 tablet by mouth  twice a day for diabetes 180 tablet 3  . Multiple Vitamins-Minerals (MULTIVITAMIN WITH MINERALS) tablet Take 1 tablet by mouth daily.      . pantoprazole (PROTONIX) 40 MG tablet Take 1 tablet by mouth  every day 90 tablet 3  . temazepam (RESTORIL) 30 MG capsule TAKE 1 CAPSULE BY MOUTH ONCE DAILY AT BEDTIME 90 capsule 3  . valACYclovir (VALTREX) 500 MG tablet Take 1 tablet (500 mg total) by mouth 2 (two) times daily as needed. 180 tablet 3   No current facility-administered medications on file prior to visit.   Health Maintenance:   Immunization History  Administered Date(s) Administered  . Tdap 07/08/2011  . Zoster 03/21/2014   Tetanus: 2013 Pneumovax: N/A Prevnar 13: N/A Flu vaccine: 2015 at work Zostavax: 02/2014 Pap: 03/2014 normal- Dr. Garwin Brothers MGM: 03/2014 DEXA: 03/2012 Colonoscopy: 2012 Dr. Olevia Perches- 2022 EGD: N/A CT AB 2014 CT head/neck 2014 CXR 2013  Allergies: No Known Allergies Medical History:  Past Medical History  Diagnosis Date  . Constipation, chronic   . PCOS (polycystic ovarian syndrome)     takes metformin for this  . Hypertension   . Fibroid uterus   . Diverticulitis   . Migraine   . History of gallstones   . Diverticulosis   . Type II or unspecified type diabetes mellitus without mention of complication, not stated as uncontrolled   . Thyroid disease   . Hypothyroidism   . Hyperlipidemia   . Arthritis   . Prediabetes    Surgical History:  Past Surgical History  Procedure Laterality Date  . Breast reduction surgery  1988  . Cholecystectomy  1995  . Tonsillectomy and adenoidectomy   1990's  . Lasik Left   . Refractive surgery Left   . Breast surgery    . Eye surgery     Family History:  Family History  Problem Relation Age of Onset  . Colon cancer Neg Hx   . Diabetes Mother   . Lung cancer Mother   . COPD Mother   . Diverticulitis Mother   . Leukemia Mother   . Heart disease Mother   . Diabetes Sister    Social History:  History  Substance Use Topics  . Smoking status: Never Smoker   . Smokeless tobacco: Never Used  . Alcohol Use: Yes     Comment: rarely   Review of Systems  Constitutional: Negative.   HENT: Positive for congestion. Negative for ear discharge, ear pain, hearing loss, nosebleeds, sore throat and tinnitus.   Eyes: Negative.   Respiratory: Positive for cough. Negative for hemoptysis, sputum  production, shortness of breath, wheezing and stridor.   Cardiovascular: Negative.   Gastrointestinal: Positive for heartburn, diarrhea and constipation. Negative for nausea, vomiting, abdominal pain, blood in stool and melena.  Genitourinary: Negative for dysuria, urgency, frequency, hematuria and flank pain.       Very rare stress incontinence  Musculoskeletal: Negative.   Skin: Negative.   Neurological: Negative.  Negative for headaches.  Psychiatric/Behavioral: Negative.     Physical Exam: Estimated body mass index is 35.15 kg/(m^2) as calculated from the following:   Height as of this encounter: 5' (1.524 m).   Weight as of this encounter: 180 lb (81.647 kg). BP 130/78 mmHg  Pulse 88  Temp(Src) 97.7 F (36.5 C)  Resp 16  Ht 5' (1.524 m)  Wt 180 lb (81.647 kg)  BMI 35.15 kg/m2 General Appearance: Well nourished, in no apparent distress. Eyes: PERRLA, EOMs, conjunctiva no swelling or erythema, normal fundi and vessels. Sinuses: No Frontal/maxillary tenderness ENT/Mouth: Ext aud canals clear, normal light reflex with TMs without erythema, bulging.  Good dentition. No erythema, swelling, or exudate on post pharynx. Tonsils not swollen or  erythematous. Hearing normal.  Neck: Supple, thyroid normal. No bruits Respiratory: Respiratory effort normal, BS equal bilaterally without rales, rhonchi, wheezing or stridor. Cardio: RRR without murmurs, rubs or gallops. Brisk peripheral pulses without edema.  Chest: symmetric, with normal excursions and percussion. Breasts: defer Abdomen: Soft, +BS. Non tender, no guarding, rebound, hernias, masses, or organomegaly. .  Lymphatics: Non tender without lymphadenopathy.  Genitourinary: defer Musculoskeletal: Full ROM all peripheral extremities,5/5 strength, and normal gait. Skin: Warm, dry without rashes, lesions, ecchymosis.  Neuro: Cranial nerves intact, reflexes equal bilaterally. Normal muscle tone, no cerebellar symptoms. Sensation intact.  Psych: Awake and oriented X 3, normal affect, Insight and Judgment appropriate.   EKG: WNL no changes.   Vicie Mutters 2:13 PM

## 2014-09-27 NOTE — Patient Instructions (Signed)
Cut invokana in half, and then stop after 1 month.  Can continue belviq.   Before you even begin to attack a weight-loss plan, it pays to remember this: You are not fat. You have fat. Losing weight isn't about blame or shame; it's simply another achievement to accomplish. Dieting is like any other skill-you have to buckle down and work at it. As long as you act in a smart, reasonable way, you'll ultimately get where you want to be. Here are some weight loss pearls for you.  1. It's Not a Diet. It's a Lifestyle Thinking of a diet as something you're on and suffering through only for the short term doesn't work. To shed weight and keep it off, you need to make permanent changes to the way you eat. It's OK to indulge occasionally, of course, but if you cut calories temporarily and then revert to your old way of eating, you'll gain back the weight quicker than you can say yo-yo. Use it to lose it. Research shows that one of the best predictors of long-term weight loss is how many pounds you drop in the first month. For that reason, nutritionists often suggest being stricter for the first two weeks of your new eating strategy to build momentum. Cut out added sugar and alcohol and avoid unrefined carbs. After that, figure out how you can reincorporate them in a way that's healthy and maintainable.  2. There's a Right Way to Exercise Working out burns calories and fat and boosts your metabolism by building muscle. But those trying to lose weight are notorious for overestimating the number of calories they burn and underestimating the amount they take in. Unfortunately, your system is biologically programmed to hold on to extra pounds and that means when you start exercising, your body senses the deficit and ramps up its hunger signals. If you're not diligent, you'll eat everything you burn and then some. Use it to lose it. Cardio gets all the exercise glory, but strength and interval training are the real heroes.  They help you build lean muscle, which in turn increases your metabolism and calorie-burning ability 3. Don't Overreact to Mild Hunger Some people have a hard time losing weight because of hunger anxiety. To them, being hungry is bad-something to be avoided at all costs-so they carry snacks with them and eat when they don't need to. Others eat because they're stressed out or bored. While you never want to get to the point of being ravenous (that's when bingeing is likely to happen), a hunger pang, a craving, or the fact that it's 3:00 p.m. should not send you racing for the vending machine or obsessing about the energy bar in your purse. Ideally, you should put off eating until your stomach is growling and it's difficult to concentrate.  Use it to lose it. When you feel the urge to eat, use the HALT method. Ask yourself, Am I really hungry? Or am I angry or anxious, lonely or bored, or tired? If you're still not certain, try the apple test. If you're truly hungry, an apple should seem delicious; if it doesn't, something else is going on. Or you can try drinking water and making yourself busy, if you are still hungry try a healthy snack.  4. Not All Calories Are Created Equal The mechanics of weight loss are pretty simple: Take in fewer calories than you use for energy. But the kind of food you eat makes all the difference. Processed food that's high in saturated  fat and refined starch or sugar can cause inflammation that disrupts the hormone signals that tell your brain you're full. The result: You eat a lot more.  Use it to lose it. Clean up your diet. Swap in whole, unprocessed foods, including vegetables, lean protein, and healthy fats that will fill you up and give you the biggest nutritional bang for your calorie buck. In a few weeks, as your brain starts receiving regular hunger and fullness signals once again, you'll notice that you feel less hungry overall and naturally start cutting back on the amount  you eat.  5. Protein, Produce, and Plant-Based Fats Are Your Weight-Loss Trinity Here's why eating the three Ps regularly will help you drop pounds. Protein fills you up. You need it to build lean muscle, which keeps your metabolism humming so that you can torch more fat. People in a weight-loss program who ate double the recommended daily allowance for protein (about 110 grams for a 150-pound woman) lost 70 percent of their weight from fat, while people who ate the RDA lost only about 40 percent, one study found. Produce is packed with filling fiber. "It's very difficult to consume too many calories if you're eating a lot of vegetables. Example: Three cups of broccoli is a lot of food, yet only 93 calories. (Fruit is another story. It can be easy to overeat and can contain a lot of calories from sugar, so be sure to monitor your intake.) Plant-based fats like olive oil and those in avocados and nuts are healthy and extra satiating.  Use it to lose it. Aim to incorporate each of the three Ps into every meal and snack. People who eat protein throughout the day are able to keep weight off, according to a study in the [place] of Clinical Nutrition. In addition to meat, poultry and seafood, good sources are beans, lentils, eggs, tofu, and yogurt. As for fat, keep portion sizes in check by measuring out salad dressing, oil, and nut butters (shoot for one to two tablespoons). Finally, eat veggies or a little fruit at every meal. People who did that consumed 308 fewer calories but didn't feel any hungrier than when they didn't eat more produce.  7. How You Eat Is As Important As What You Eat In order for your brain to register that you're full, you need to focus on what you're eating. Sit down whenever you eat, preferably at a table. Turn off the TV or computer, put down your phone, and look at your food. Smell it. Chew slowly, and don't put another bite on your fork until you swallow. When women ate  lunch this attentively, they consumed 30 percent less when snacking later than those who listened to an audiobook at lunchtime, according to a study in the [place] of Nutrition. 8. Weighing Yourself Really Works The scale provides the best evidence about whether your efforts are paying off. Seeing the numbers tick up or down or stagnate is motivation to keep going-or to rethink your approach. A 2015 study at [facility] found that daily weigh-ins helped people lose more weight, keep it off, and maintain that loss, even after two years. Use it to lose it. Step on the scale at the same time every day for the best results. If your weight shoots up several pounds from one weigh-in to the next, don't freak out. Eating a lot of salt the night before or having your period is the likely culprit. The number should return to normal  in a day or two. It's a steady climb that you need to do something about. 9. Too Much Stress and Too Little Sleep Are Your Enemies When you're tired and frazzled, your body cranks up the production of cortisol, the stress hormone that can cause carb cravings. Not getting enough sleep also boosts your levels of ghrelin, a hormone associated with hunger, while suppressing leptin, a hormone that signals fullness and satiety. People on a diet who slept only five and a half hours a night for two weeks lost 55 percent less fat and were hungrier than those who slept eight and a half hours, according to a study in the [place]. Use it to lose it. Prioritize sleep, aiming for seven hours or more a night, which research shows helps lower stress. And make sure you're getting quality zzz's. If a snoring spouse or a fidgety cat wakes you up frequently throughout the night, you may end up getting the equivalent of just four hours of sleep, according to a study from [facility]. Keep pets out of the bedroom, and use a white-noise app to drown out  snoring. 10. You Will Hit a plateau-And You Can Bust Through It As you slim down, your body releases much less leptin, the fullness hormone.  If you're not strength training, start right now. Building muscle can raise your metabolism to help you overcome a plateau. To keep your body challenged and burning calories, incorporate new moves and more intense intervals into your workouts or add another sweat session to your weekly routine. Alternatively, cut an extra 100 calories or so a day from your diet. Now that you've lost weight, your body simply doesn't need as much fuel.   Ways to cut 100 calories  1. Eat your eggs with hot sauce OR salsa instead of cheese.  Eggs are great for breakfast, but many people consider eggs and cheese to be BFFs. Instead of cheese-1 oz. of cheddar has 114 calories-top your eggs with hot sauce, which contains no calories and helps with satiety and metabolism. Salsa is also a great option!!  2. Top your toast, waffles or pancakes with mashed berries instead of jelly or syrup. Half a cup of berries-fresh, frozen or thawed-has about 40 calories, compared with 2 tbsp. of maple syrup or jelly, which both have about 100 calories. The berries will also give you a good punch of fiber, which helps keep you full and satisfied and won't spike blood sugar quickly like the jelly or syrup. 3. Swap the non-fat latte for black coffee with a splash of half-and-half. Contrary to its name, that non-fat latte has 130 calories and a startling 19g of carbohydrates per 16 oz. serving. Replacing that 'light' drinkable dessert with a black coffee with a splash of half-and-half saves you more than 100 calories per 16 oz. serving. 4. Sprinkle salads with freeze-dried raspberries instead of dried cranberries. If you want a sweet addition to your nutritious salad, stay away from dried cranberries. They have a whopping 130 calories per  cup and 30g carbohydrates. Instead, sprinkle freeze-dried raspberries  guilt-free and save more than 100 calories per  cup serving, adding 3g of belly-filling fiber. 5. Go for mustard in place of mayo on your sandwich. Mustard can add really nice flavor to any sandwich, and there are tons of varieties, from spicy to honey. A serving of mayo is 95 calories, versus 10 calories in a serving of mustard. 6. Choose a DIY salad dressing instead of  the store-bought kind. Mix Dijon or whole grain mustard with low-fat Kefir or red wine vinegar and garlic. 7. Use hummus as a spread instead of a dip. Use hummus as a spread on a high-fiber cracker or tortilla with a sandwich and save on calories without sacrificing taste. 8. Pick just one salad "accessory." Salad isn't automatically a calorie winner. It's easy to over-accessorize with toppings. Instead of topping your salad with nuts, avocado and cranberries (all three will clock in at 313 calories), just pick one. The next day, choose a different accessory, which will also keep your salad interesting. You don't wear all your jewelry every day, right? 9. Ditch the white pasta in favor of spaghetti squash. One cup of cooked spaghetti squash has about 40 calories, compared with traditional spaghetti, which comes with more than 200. Spaghetti squash is also nutrient-dense. It's a good source of fiber and Vitamins A and C, and it can be eaten just like you would eat pasta-with a great tomato sauce and [place] meatballs or with pesto, tofu and spinach, for example. 10. Dress up your chili, soups and stews with non-fat [place] yogurt instead of sour cream. Just a 'dollop' of sour cream can set you back 115 calories and a whopping 12g of fat-seven of which are of the artery-clogging variety. Added bonus: [place] yogurt is packed with muscle-building protein, calcium and B Vitamins. 11. Mash cauliflower instead of mashed potatoes. One cup of traditional mashed potatoes-in all their creamy goodness-has more than 200 calories, compared to mashed  cauliflower, which you can typically eat for less than 100 calories per 1 cup serving. Cauliflower is a great source of the antioxidant indole-3-carbinol (I3C), which may help reduce the risk of some cancers, like breast cancer. 12. Ditch the ice cream sundae in favor of a [place] yogurt parfait. Instead of a cup of ice cream or fro-yo for dessert, try 1 cup of nonfat Greek yogurt topped with fresh berries and a sprinkle of cacao nibs. Both toppings are packed with antioxidants, which can help reduce cellular inflammation and oxidative damage. And the comparison is a no-brainer: One cup of ice cream has about 275 calories; one cup of frozen yogurt has about 230; and a cup of Greek yogurt has just 130, plus twice the protein, so you're less likely to return to the freezer for a second helping. 13. Put olive oil in a spray container instead of using it directly from the bottle. Each tablespoon of olive oil is 120 calories and 15g of fat. Use a mister instead of pouring it straight into the pan or onto a salad. This allows for portion control and will save you more than 100 calories. 14. When baking, substitute canned pumpkin for butter or oil. Canned pumpkin-not pumpkin pie mix-is loaded with Vitamin A, which is important for skin and eye health, as well as immunity. And the comparisons are pretty crazy:  cup of canned pumpkin has about 40 calories, compared to butter or oil, which has more than 800 calories. Yes, 800 calories. Applesauce and mashed banana can also serve as good substitutions for butter or oil, usually in a 1:1 ratio. 15. Top casseroles with high-fiber cereal instead of breadcrumbs. Breadcrumbs are typically made with white bread, while breakfast cereals contain 5-9g of fiber per serving. Not only will you save more than 150 calories per  cup serving, the swap will also keep you more full and you'll get a metabolism boost from the added fiber. 16. Snack on pistachios instead  of macadamia  nuts. Believe it or not, you get the same amount of calories from 35 pistachios (100 calories) as you would from only five macadamia nuts. 17. Chow down on kale chips rather than potato chips. This is my favorite 'don't knock it 'till you try it' swap. Kale chips are so easy to make at home, and you can spice them up with a little grated parmesan or chili powder. Plus, they're a mere fraction of the calories of potato chips, but with the same crunch factor we crave so often. 18. Add seltzer and some fruit slices to your cocktail instead of soda or fruit juice. One cup of soda or fruit juice can pack on as much as 140 calories. Instead, use seltzer and fruit slices. The fruit provides valuable phytochemicals, such as flavonoids and anthocyanins, which help to combat cancer and stave off the aging process.   GETTING OFF OF PPI's    Nexium/protonix/prilosec/Omeprazole/Dexilant/Aciphex are called PPI's, they are great at healing your stomach but should only be taken for a short period of time.     Recent studies have shown that taken for a long time they  can increase the risk of osteoporosis (weakening of your bones), pneumonia, low magnesium, restless legs, Cdiff (infection that causes diarrhea), DEMENTIA and most recently kidney damage / disease / insufficiency.     Due to this information we want to try to stop the PPI but if you try to stop it abruptly this can cause rebound acid and worsening symptoms.   So this is how we want you to get off the PPI:  - Start taking the nexium/protonix/prilosec/PPI  every other day with  zantac (ranitidine) 2 x a day for 2-4 weeks  - then decrease the PPI to every 3 days while taking the zantac (ranitidine) twice a day the other  days for 2-4  Weeks  - then you can try the zantac (ranitidine) once at night or up to 2 x day as needed.  - you can continue on this once at night or stop all together  - Avoid alcohol, spicy foods, NSAIDS (aleve, ibuprofen) at  this time. See foods below.   +++++++++++++++++++++++++++++++++++++++++++  Food Choices for Gastroesophageal Reflux Disease  When you have gastroesophageal reflux disease (GERD), the foods you eat and your eating habits are very important. Choosing the right foods can help ease the discomfort of GERD. WHAT GENERAL GUIDELINES DO I NEED TO FOLLOW?  Choose fruits, vegetables, whole grains, low-fat dairy products, and low-fat meat, fish, and poultry.  Limit fats such as oils, salad dressings, butter, nuts, and avocado.  Keep a food diary to identify foods that cause symptoms.  Avoid foods that cause reflux. These may be different for different people.  Eat frequent small meals instead of three large meals each day.  Eat your meals slowly, in a relaxed setting.  Limit fried foods.  Cook foods using methods other than frying.  Avoid drinking alcohol.  Avoid drinking large amounts of liquids with your meals.  Avoid bending over or lying down until 2-3 hours after eating.   WHAT FOODS ARE NOT RECOMMENDED? The following are some foods and drinks that may worsen your symptoms:  Vegetables Tomatoes. Tomato juice. Tomato and spaghetti sauce. Chili peppers. Onion and garlic. Horseradish. Fruits Oranges, grapefruit, and lemon (fruit and juice). Meats High-fat meats, fish, and poultry. This includes hot dogs, ribs, ham, sausage, salami, and bacon. Dairy Whole milk and chocolate milk. Sour cream. Cream. Butter. Ice cream.  Cream cheese.  Beverages Coffee and tea, with or without caffeine. Carbonated beverages or energy drinks. Condiments Hot sauce. Barbecue sauce.  Sweets/Desserts Chocolate and cocoa. Donuts. Peppermint and spearmint. Fats and Oils High-fat foods, including [place] fries and potato chips. Other Vinegar. Strong spices, such as black pepper, white pepper, red pepper, cayenne, curry powder, cloves, ginger, and chili powder. Nexium/protonix/prilosec are called PPI's,  they are great at healing your stomach but should only be taken for a short period of time.

## 2014-09-28 LAB — HEMOGLOBIN A1C
Hgb A1c MFr Bld: 5.5 %
Mean Plasma Glucose: 111 mg/dL

## 2014-09-28 LAB — LIPID PANEL
Cholesterol: 173 mg/dL (ref 0–200)
HDL: 44 mg/dL — ABNORMAL LOW
LDL Cholesterol: 111 mg/dL — ABNORMAL HIGH (ref 0–99)
Total CHOL/HDL Ratio: 3.9 ratio
Triglycerides: 92 mg/dL
VLDL: 18 mg/dL (ref 0–40)

## 2014-09-28 LAB — URINALYSIS, ROUTINE W REFLEX MICROSCOPIC
Bilirubin Urine: NEGATIVE
Glucose, UA: >1000 mg/dL — AB
Hgb urine dipstick: NEGATIVE
Ketones, ur: NEGATIVE mg/dL
Leukocytes, UA: NEGATIVE
Nitrite: NEGATIVE
Protein, ur: NEGATIVE mg/dL
Specific Gravity, Urine: 1.008 (ref 1.005–1.030)
Urobilinogen, UA: 0.2 mg/dL (ref 0.0–1.0)
pH: 6.5 (ref 5.0–8.0)

## 2014-09-28 LAB — INSULIN, FASTING: INSULIN FASTING, SERUM: 8.2 u[IU]/mL (ref 2.0–19.6)

## 2014-09-28 LAB — IRON AND TIBC
%SAT: 10 % — ABNORMAL LOW (ref 20–55)
IRON: 35 ug/dL — AB (ref 42–145)
TIBC: 360 ug/dL (ref 250–470)
UIBC: 325 ug/dL (ref 125–400)

## 2014-09-28 LAB — BASIC METABOLIC PANEL WITH GFR
BUN: 12 mg/dL (ref 6–23)
CO2: 26 meq/L (ref 19–32)
CREATININE: 0.7 mg/dL (ref 0.50–1.10)
Calcium: 9.2 mg/dL (ref 8.4–10.5)
Chloride: 100 mEq/L (ref 96–112)
GFR, Est African American: >89 mL/min
GFR, Est Non African American: >89 mL/min
GLUCOSE: 67 mg/dL — AB (ref 70–99)
POTASSIUM: 3.8 meq/L (ref 3.5–5.3)
Sodium: 138 mEq/L (ref 135–145)

## 2014-09-28 LAB — MAGNESIUM: Magnesium: 1.9 mg/dL (ref 1.5–2.5)

## 2014-09-28 LAB — URINALYSIS, MICROSCOPIC ONLY
Casts: NONE SEEN
Crystals: NONE SEEN
Squamous Epithelial / HPF: NONE SEEN

## 2014-09-28 LAB — HEPATIC FUNCTION PANEL
ALT: 13 U/L (ref 0–35)
AST: 12 U/L (ref 0–37)
Albumin: 3.8 g/dL (ref 3.5–5.2)
Alkaline Phosphatase: 55 U/L (ref 39–117)
Bilirubin, Direct: 0.1 mg/dL (ref 0.0–0.3)
Indirect Bilirubin: 0.2 mg/dL (ref 0.2–1.2)
Total Bilirubin: 0.3 mg/dL (ref 0.2–1.2)
Total Protein: 6.2 g/dL (ref 6.0–8.3)

## 2014-09-28 LAB — FERRITIN: FERRITIN: 27 ng/mL (ref 10–291)

## 2014-09-28 LAB — MICROALBUMIN / CREATININE URINE RATIO: Creatinine, Urine: 35.4 mg/dL

## 2014-09-28 LAB — TSH: TSH: 1.14 u[IU]/mL (ref 0.350–4.500)

## 2014-09-28 LAB — VITAMIN D 25 HYDROXY (VIT D DEFICIENCY, FRACTURES): VIT D 25 HYDROXY: 53 ng/mL (ref 30–100)

## 2014-09-28 LAB — VITAMIN B12: Vitamin B-12: 774 pg/mL (ref 211–911)

## 2014-10-02 ENCOUNTER — Other Ambulatory Visit: Payer: Self-pay

## 2014-10-02 MED ORDER — LEVOTHYROXINE SODIUM 100 MCG PO TABS: 100.0000 ug | ORAL_TABLET | Freq: Every day | ORAL | Status: DC

## 2014-10-04 ENCOUNTER — Other Ambulatory Visit: Payer: Self-pay

## 2014-10-04 MED ORDER — LEVOTHYROXINE SODIUM 100 MCG PO TABS: 100.0000 ug | ORAL_TABLET | Freq: Every day | ORAL | Status: DC

## 2014-10-19 ENCOUNTER — Telehealth: Payer: Self-pay

## 2014-10-19 MED ORDER — FLUCONAZOLE 150 MG PO TABS: 150.0000 mg | ORAL_TABLET | Freq: Every day | ORAL | Status: DC

## 2014-10-19 NOTE — Telephone Encounter (Signed)
Pt called c/o yeast infection. Pt requesting Diflucan 150 mg #3. Please advise.[place].

## 2014-10-28 ENCOUNTER — Other Ambulatory Visit: Payer: Self-pay | Admitting: Physician Assistant

## 2014-11-01 ENCOUNTER — Other Ambulatory Visit: Payer: Self-pay

## 2014-11-01 MED ORDER — CANAGLIFLOZIN 300 MG PO TABS: 300.0000 mg | ORAL_TABLET | Freq: Every day | ORAL | Status: DC

## 2014-11-01 MED ORDER — TEMAZEPAM 30 MG PO CAPS: ORAL_CAPSULE | ORAL | Status: DC

## 2014-11-07 ENCOUNTER — Other Ambulatory Visit: Payer: Self-pay

## 2014-11-07 ENCOUNTER — Telehealth: Payer: Self-pay

## 2014-11-07 MED ORDER — TEMAZEPAM 30 MG PO CAPS: ORAL_CAPSULE | ORAL | Status: DC

## 2014-11-07 NOTE — Telephone Encounter (Signed)
Patient called regarding Tamazepam RX sent to [facility] on 11-01-14, she states [facility] did not receive , I checked and our our RX was noted as "no print" , sent a 30 day local and the 90 refaxed to [facility] , lmom for patient as to what I have done with Rx's

## 2015-02-15 ENCOUNTER — Other Ambulatory Visit: Payer: Self-pay | Admitting: Internal Medicine

## 2015-02-21 ENCOUNTER — Other Ambulatory Visit: Payer: Self-pay

## 2015-02-21 ENCOUNTER — Other Ambulatory Visit: Payer: Self-pay | Admitting: Internal Medicine

## 2015-02-21 DIAGNOSIS — Z1231 Encounter for screening mammogram for malignant neoplasm of breast: Secondary | ICD-10-CM

## 2015-02-27 ENCOUNTER — Encounter: Payer: Self-pay | Admitting: Physician Assistant

## 2015-02-27 ENCOUNTER — Other Ambulatory Visit: Payer: Self-pay | Admitting: Internal Medicine

## 2015-02-27 ENCOUNTER — Ambulatory Visit (INDEPENDENT_AMBULATORY_CARE_PROVIDER_SITE_OTHER): Payer: 59 | Admitting: Physician Assistant

## 2015-02-27 VITALS — BP 134/70 | HR 97 | Temp 97.5°F | Resp 16 | Ht 61.0 in | Wt 166.0 lb

## 2015-02-27 DIAGNOSIS — R7303 Prediabetes: Secondary | ICD-10-CM | POA: Diagnosis not present

## 2015-02-27 DIAGNOSIS — E785 Hyperlipidemia, unspecified: Secondary | ICD-10-CM | POA: Diagnosis not present

## 2015-02-27 DIAGNOSIS — E669 Obesity, unspecified: Secondary | ICD-10-CM

## 2015-02-27 DIAGNOSIS — Z79899 Other long term (current) drug therapy: Secondary | ICD-10-CM

## 2015-02-27 DIAGNOSIS — E039 Hypothyroidism, unspecified: Secondary | ICD-10-CM | POA: Diagnosis not present

## 2015-02-27 DIAGNOSIS — I10 Essential (primary) hypertension: Secondary | ICD-10-CM

## 2015-02-27 DIAGNOSIS — E559 Vitamin D deficiency, unspecified: Secondary | ICD-10-CM

## 2015-02-27 LAB — CBC WITH DIFFERENTIAL/PLATELET
BASOS PCT: 1 % (ref 0–1)
Basophils Absolute: 0.1 10*3/uL (ref 0.0–0.1)
EOS ABS: 0.2 10*3/uL (ref 0.0–0.7)
Eosinophils Relative: 2 % (ref 0–5)
HEMATOCRIT: 42.4 % (ref 36.0–46.0)
Hemoglobin: 13.8 g/dL (ref 12.0–15.0)
LYMPHS ABS: 2.7 10*3/uL (ref 0.7–4.0)
Lymphocytes Relative: 29 % (ref 12–46)
MCH: 29.1 pg (ref 26.0–34.0)
MCHC: 32.5 g/dL (ref 30.0–36.0)
MCV: 89.5 fL (ref 78.0–100.0)
MPV: 8.9 fL (ref 8.6–12.4)
Monocytes Absolute: 0.8 10*3/uL (ref 0.1–1.0)
Monocytes Relative: 8 % (ref 3–12)
NEUTROS PCT: 60 % (ref 43–77)
Neutro Abs: 5.6 10*3/uL (ref 1.7–7.7)
PLATELETS: 344 10*3/uL (ref 150–400)
RBC: 4.74 MIL/uL (ref 3.87–5.11)
RDW: 13.5 % (ref 11.5–15.5)
WBC: 9.4 10*3/uL (ref 4.0–10.5)

## 2015-02-27 LAB — BASIC METABOLIC PANEL WITH GFR
BUN: 15 mg/dL (ref 7–25)
CALCIUM: 9.5 mg/dL (ref 8.6–10.4)
CO2: 29 mmol/L (ref 20–31)
CREATININE: 0.57 mg/dL (ref 0.50–1.05)
Chloride: 100 mmol/L (ref 98–110)
GFR, Est Non African American: >89 mL/min (ref >=60)
GLUCOSE: 112 mg/dL — AB (ref 65–99)
POTASSIUM: 3.4 mmol/L — AB (ref 3.5–5.3)
Sodium: 137 mmol/L (ref 135–146)

## 2015-02-27 LAB — LIPID PANEL
CHOL/HDL RATIO: 4.2 ratio (ref <=5.0)
Cholesterol: 176 mg/dL (ref 125–200)
HDL: 42 mg/dL — AB (ref >=46)
LDL Cholesterol: 110 mg/dL (ref <130)
TRIGLYCERIDES: 118 mg/dL (ref <150)
VLDL: 24 mg/dL (ref <30)

## 2015-02-27 LAB — HEPATIC FUNCTION PANEL
ALBUMIN: 4 g/dL (ref 3.6–5.1)
ALK PHOS: 62 U/L (ref 33–130)
ALT: 13 U/L (ref 6–29)
AST: 11 U/L (ref 10–35)
BILIRUBIN TOTAL: 0.3 mg/dL (ref 0.2–1.2)
Bilirubin, Direct: 0.1 mg/dL (ref <=0.2)
Indirect Bilirubin: 0.2 mg/dL (ref 0.2–1.2)
TOTAL PROTEIN: 6.6 g/dL (ref 6.1–8.1)

## 2015-02-27 LAB — MAGNESIUM: Magnesium: 1.9 mg/dL (ref 1.5–2.5)

## 2015-02-27 MED ORDER — SCOPOLAMINE 1 MG/3DAYS TD PT72: 1.0000 | MEDICATED_PATCH | TRANSDERMAL | Status: DC

## 2015-02-27 MED ORDER — RANITIDINE HCL 300 MG PO TABS: 300.0000 mg | ORAL_TABLET | Freq: Every day | ORAL | Status: DC

## 2015-02-27 MED ORDER — CANAGLIFLOZIN 300 MG PO TABS: 300.0000 mg | ORAL_TABLET | Freq: Every day | ORAL | Status: DC

## 2015-02-27 MED ORDER — FLUCONAZOLE 150 MG PO TABS: 150.0000 mg | ORAL_TABLET | Freq: Every day | ORAL | Status: DC

## 2015-02-27 MED ORDER — ONDANSETRON HCL 4 MG PO TABS: 4.0000 mg | ORAL_TABLET | Freq: Every day | ORAL | Status: DC | PRN

## 2015-02-27 NOTE — Progress Notes (Signed)
Assessment and Plan:  1. Hypertension -Continue medication, monitor blood pressure at home. Continue DASH diet.  Reminder to go to the ER if any CP, SOB, nausea, dizziness, severe HA, changes vision/speech, left arm numbness and tingling and jaw pain.  2. Cholesterol -Continue diet and exercise. Check cholesterol.   3. Prediabetes  -Continue diet and exercise. Check A1C, may need to stop invokana  4. Vitamin D Def - check level and continue medications.   5. Nausea Benign AB, zofran PRN, check labs.   6. Obesity with co morbidities - long discussion about weight loss, diet, and exercise Suggest gym membership and weight loss program.    Continue diet and meds as discussed. Further disposition pending results of labs. Over 30 minutes of exam, counseling, chart review, and critical decision making was performed  HPI 54 y.o. female  presents for 3 month follow up on hypertension, cholesterol, prediabetes, and vitamin D deficiency.   Her blood pressure has been controlled at home, today their BP is BP: 134/70 mmHg  She does workout, does zumba. She denies chest pain, shortness of breath, dizziness.  She is not on cholesterol medication and denies myalgias. Her cholesterol is at goal. The cholesterol last visit was:   Lab Results  Component Value Date   CHOL 173 09/27/2014   HDL 44* 09/27/2014   LDLCALC 111* 09/27/2014   TRIG 92 09/27/2014   CHOLHDL 3.9 09/27/2014    She has been working on diet and exercise for diabetes, has been on invokana 150mg  and metformin so she is now in the normal A1C range, and denies paresthesia of the feet, polydipsia, polyuria and visual disturbances. Last A1C in the office was:  Lab Results  Component Value Date   HGBA1C 5.5 09/27/2014   Patient is on Vitamin D supplement.   Lab Results  Component Value Date   VD25OH 53 09/27/2014     She is on thyroid medication. Her medication was not changed last visit.   Lab Results  Component Value Date    TSH 1.140 09/27/2014   BMI is Body mass index is 37.5 kg/(m^2)., she is working on diet and exercise. She has flex spending and would like a letter to join gym and do [employer].  Wt Readings from Last 3 Encounters:  02/27/15 192 lb (87.091 kg)  09/27/14 180 lb (81.647 kg)  07/05/14 181 lb (82.101 kg)    Current Medications:  Current Outpatient Prescriptions on File Prior to Visit  Medication Sig Dispense Refill  . Aspirin (STANBACK HEADACHE POWDER PO) Take by mouth.    . BELVIQ 10 MG TABS 1 pill twice a day 60 tablet 5  . CALCIUM-VITAMIN D PO Take 1 tablet by mouth daily.    . canagliflozin (INVOKANA) 300 MG TABS tablet Take 300 mg by mouth daily. 30 tablet 3  . estradiol-norethindrone (ACTIVELLA) 1-0.5 MG per tablet Take 1 tablet by mouth  daily 84 tablet 3  . fluconazole (DIFLUCAN) 150 MG tablet Take 1 tablet (150 mg total) by mouth daily. 1 tablet 3  . GARLIC PO Take 1 tablet by mouth 2 (two) times daily.      Marland Kitchen levothyroxine (SYNTHROID, LEVOTHROID) 100 MCG tablet Take 1 tablet (100 mcg total) by mouth daily before breakfast. 90 tablet 3  . losartan-hydrochlorothiazide (HYZAAR) 100-25 MG tablet Take 1 tablet by mouth  daily 90 tablet 0  . LYSINE PO Take 1 tablet by mouth at bedtime.      . metFORMIN (GLUCOPHAGE) 1000 MG tablet  Take 1 tablet by mouth  twice a day for diabetes 180 tablet 3  . Multiple Vitamins-Minerals (MULTIVITAMIN WITH MINERALS) tablet Take 1 tablet by mouth daily.      . pantoprazole (PROTONIX) 40 MG tablet Take 1 tablet by mouth  every day 90 tablet 1  . temazepam (RESTORIL) 30 MG capsule TAKE 1 CAPSULE BY MOUTH ONCE DAILY AT BEDTIME 90 capsule 3  . valACYclovir (VALTREX) 500 MG tablet Take 1 tablet (500 mg total) by mouth 2 (two) times daily as needed. 180 tablet 3   No current facility-administered medications on file prior to visit.   Medical History:  Past Medical History  Diagnosis Date  . Constipation, chronic   . PCOS (polycystic ovarian  syndrome)     takes metformin for this  . Hypertension   . Fibroid uterus   . Diverticulitis   . Migraine   . History of gallstones   . Diverticulosis   . Type II or unspecified type diabetes mellitus without mention of complication, not stated as uncontrolled   . Thyroid disease   . Hypothyroidism   . Hyperlipidemia   . Arthritis   . Prediabetes    Allergies: No Known Allergies   Review of Systems:  Review of Systems  Constitutional: Negative.  Negative for fever and chills.  HENT: Negative.   Respiratory: Negative.   Cardiovascular: Negative.   Gastrointestinal: Positive for nausea and diarrhea (resolved). Negative for heartburn, vomiting, abdominal pain, constipation, blood in stool and melena.  Genitourinary: Negative.   Musculoskeletal: Negative.   Skin: Negative.   Neurological: Negative.   Psychiatric/Behavioral: Negative.     Family history- Review and unchanged Social history- Review and unchanged Physical Exam: BP 134/70 mmHg  Pulse 97  Temp(Src) 97.5 F (36.4 C) (Temporal)  Resp 16  Wt 192 lb (87.091 kg)  SpO2 98% Wt Readings from Last 3 Encounters:  02/27/15 192 lb (87.091 kg)  09/27/14 180 lb (81.647 kg)  07/05/14 181 lb (82.101 kg)   General Appearance: Well nourished, in no apparent distress. Eyes: PERRLA, EOMs, conjunctiva no swelling or erythema Sinuses: No Frontal/maxillary tenderness ENT/Mouth: Ext aud canals clear, TMs without erythema, bulging. No erythema, swelling, or exudate on post pharynx.  Tonsils not swollen or erythematous. Hearing normal.  Neck: Supple, thyroid normal.  Respiratory: Respiratory effort normal, BS equal bilaterally without rales, rhonchi, wheezing or stridor.  Cardio: RRR with no MRGs. Brisk peripheral pulses without edema.  Abdomen: Soft, + BS,  Non tender, no guarding, rebound, hernias, masses. Lymphatics: Non tender without lymphadenopathy.  Musculoskeletal: Full ROM, 5/5 strength, Normal gait Skin: Warm, dry  without rashes, lesions, ecchymosis.  Neuro: Cranial nerves intact. Normal muscle tone, no cerebellar symptoms. Psych: Awake and oriented X 3, normal affect, Insight and Judgment appropriate.    Vicie Mutters, PA-C 4:22 PM [facility]

## 2015-02-27 NOTE — Patient Instructions (Signed)
We want weight loss that will last so you should lose 1-2 pounds a week.  THAT IS IT! Please pick THREE things a month to change. Once it is a habit check off the item. Then pick another three items off the list to become habits.  If you are already doing a habit on the list GREAT!  Cross that item off! o Don't drink your calories. Ie, alcohol, soda, fruit juice, and sweet tea.  o Drink more water. Drink a glass when you feel hungry or before each meal.  o Eat breakfast - Complex carb and protein (likeDannon light and fit yogurt, oatmeal, fruit, eggs, turkey bacon). o Measure your cereal.  Eat no more than one cup a day. (ie Kashi) o Eat an apple a day. o Add a vegetable a day. o Try a new vegetable a month. o Use Pam! Stop using oil or butter to cook. o Don't finish your plate or use smaller plates. o Share your dessert. o Eat sugar free Jello for dessert or frozen grapes. o Don't eat 2-3 hours before bed. o Switch to whole wheat bread, pasta, and brown rice. o Make healthier choices when you eat out. No fries! o Pick baked chicken, NOT fried. o Don't forget to SLOW DOWN when you eat. It is not going anywhere.  o Take the stairs. o Park far away in the parking lot o Lift soup cans (or weights) for 10 minutes while watching TV. o Walk at work for 10 minutes during break. o Walk outside 1 time a week with your friend, kids, dog, or significant other. o Start a walking group at church. o Walk the mall as much as you can tolerate.  o Keep a food diary. o Weigh yourself daily. o Walk for 15 minutes 3 days per week. o Cook at home more often and eat out less.  If life happens and you go back to old habits, it is okay.  Just start over. You can do it!   If you experience chest pain, get short of breath, or tired during the exercise, please stop immediately and inform your doctor.   Before you even begin to attack a weight-loss plan, it pays to remember this: You are not fat. You have fat.  Losing weight isn't about blame or shame; it's simply another achievement to accomplish. Dieting is like any other skill-you have to buckle down and work at it. As long as you act in a smart, reasonable way, you'll ultimately get where you want to be. Here are some weight loss pearls for you.  1. It's Not a Diet. It's a Lifestyle Thinking of a diet as something you're on and suffering through only for the short term doesn't work. To shed weight and keep it off, you need to make permanent changes to the way you eat. It's OK to indulge occasionally, of course, but if you cut calories temporarily and then revert to your old way of eating, you'll gain back the weight quicker than you can say yo-yo. Use it to lose it. Research shows that one of the best predictors of long-term weight loss is how many pounds you drop in the first month. For that reason, nutritionists often suggest being stricter for the first two weeks of your new eating strategy to build momentum. Cut out added sugar and alcohol and avoid unrefined carbs. After that, figure out how you can reincorporate them in a way that's healthy and maintainable.  2. There's a Right   Way to Exercise Working out burns calories and fat and boosts your metabolism by building muscle. But those trying to lose weight are notorious for overestimating the number of calories they burn and underestimating the amount they take in. Unfortunately, your system is biologically programmed to hold on to extra pounds and that means when you start exercising, your body senses the deficit and ramps up its hunger signals. If you're not diligent, you'll eat everything you burn and then some. Use it to lose it. Cardio gets all the exercise glory, but strength and interval training are the real heroes. They help you build lean muscle, which in turn increases your metabolism and calorie-burning ability 3. Don't Overreact to Mild Hunger Some people have a hard time losing weight because  of hunger anxiety. To them, being hungry is bad-something to be avoided at all costs-so they carry snacks with them and eat when they don't need to. Others eat because they're stressed out or bored. While you never want to get to the point of being ravenous (that's when bingeing is likely to happen), a hunger pang, a craving, or the fact that it's 3:00 p.m. should not send you racing for the vending machine or obsessing about the energy bar in your purse. Ideally, you should put off eating until your stomach is growling and it's difficult to concentrate.  Use it to lose it. When you feel the urge to eat, use the HALT method. Ask yourself, Am I really hungry? Or am I angry or anxious, lonely or bored, or tired? If you're still not certain, try the apple test. If you're truly hungry, an apple should seem delicious; if it doesn't, something else is going on. Or you can try drinking water and making yourself busy, if you are still hungry try a healthy snack.  4. Not All Calories Are Created Equal The mechanics of weight loss are pretty simple: Take in fewer calories than you use for energy. But the kind of food you eat makes all the difference. Processed food that's high in saturated fat and refined starch or sugar can cause inflammation that disrupts the hormone signals that tell your brain you're full. The result: You eat a lot more.  Use it to lose it. Clean up your diet. Swap in whole, unprocessed foods, including vegetables, lean protein, and healthy fats that will fill you up and give you the biggest nutritional bang for your calorie buck. In a few weeks, as your brain starts receiving regular hunger and fullness signals once again, you'll notice that you feel less hungry overall and naturally start cutting back on the amount you eat.  5. Protein, Produce, and Plant-Based Fats Are Your Weight-Loss Trinity Here's why eating the three Ps regularly will help you drop pounds. Protein fills you up. You need it  to build lean muscle, which keeps your metabolism humming so that you can torch more fat. People in a weight-loss program who ate double the recommended daily allowance for protein (about 110 grams for a 150-pound woman) lost 70 percent of their weight from fat, while people who ate the RDA lost only about 40 percent, one study found. Produce is packed with filling fiber. "It's very difficult to consume too many calories if you're eating a lot of vegetables. Example: Three cups of broccoli is a lot of food, yet only 93 calories. (Fruit is another story. It can be easy to overeat and can contain a lot of calories from sugar, so be sure to   monitor your intake.) Plant-based fats like olive oil and those in avocados and nuts are healthy and extra satiating.  Use it to lose it. Aim to incorporate each of the three Ps into every meal and snack. People who eat protein throughout the day are able to keep weight off, according to a study in the American Journal of Clinical Nutrition. In addition to meat, poultry and seafood, good sources are beans, lentils, eggs, tofu, and yogurt. As for fat, keep portion sizes in check by measuring out salad dressing, oil, and nut butters (shoot for one to two tablespoons). Finally, eat veggies or a little fruit at every meal. People who did that consumed 308 fewer calories but didn't feel any hungrier than when they didn't eat more produce.  7. How You Eat Is As Important As What You Eat In order for your brain to register that you're full, you need to focus on what you're eating. Sit down whenever you eat, preferably at a table. Turn off the TV or computer, put down your phone, and look at your food. Smell it. Chew slowly, and don't put another bite on your fork until you swallow. When women ate lunch this attentively, they consumed 30 percent less when snacking later than those who listened to an audiobook at lunchtime, according to a study in the British Journal of Nutrition. 8.  Weighing Yourself Really Works The scale provides the best evidence about whether your efforts are paying off. Seeing the numbers tick up or down or stagnate is motivation to keep going-or to rethink your approach. A 2015 study at [employer] found that daily weigh-ins helped people lose more weight, keep it off, and maintain that loss, even after two years. Use it to lose it. Step on the scale at the same time every day for the best results. If your weight shoots up several pounds from one weigh-in to the next, don't freak out. Eating a lot of salt the night before or having your period is the likely culprit. The number should return to normal in a day or two. It's a steady climb that you need to do something about. 9. Too Much Stress and Too Little Sleep Are Your Enemies When you're tired and frazzled, your body cranks up the production of cortisol, the stress hormone that can cause carb cravings. Not getting enough sleep also boosts your levels of ghrelin, a hormone associated with hunger, while suppressing leptin, a hormone that signals fullness and satiety. People on a diet who slept only five and a half hours a night for two weeks lost 55 percent less fat and were hungrier than those who slept eight and a half hours, according to a study in the [employer] Medical Association Journal. Use it to lose it. Prioritize sleep, aiming for seven hours or more a night, which research shows helps lower stress. And make sure you're getting quality zzz's. If a snoring spouse or a fidgety cat wakes you up frequently throughout the night, you may end up getting the equivalent of just four hours of sleep, according to a study from [employer]. Keep pets out of the bedroom, and use a white-noise app to drown out snoring. 10. You Will Hit a plateau-And You Can Bust Through It As you slim down, your body releases much less leptin, the fullness hormone.  If you're not strength training, start right now.  Building muscle can raise your metabolism to help you overcome a plateau. To keep your body challenged   and burning calories, incorporate new moves and more intense intervals into your workouts or add another sweat session to your weekly routine. Alternatively, cut an extra 100 calories or so a day from your diet. Now that you've lost weight, your body simply doesn't need as much fuel.   Nausea and Vomiting Nausea is a sick feeling that often comes before throwing up (vomiting). Vomiting is a reflex where stomach contents come out of your mouth. Vomiting can cause severe loss of body fluids (dehydration). Children and elderly adults can become dehydrated quickly, especially if they also have diarrhea. Nausea and vomiting are symptoms of a condition or disease. It is important to find the cause of your symptoms. CAUSES   Direct irritation of the stomach lining. This irritation can result from increased acid production (gastroesophageal reflux disease), infection, food poisoning, taking certain medicines (such as nonsteroidal anti-inflammatory drugs), alcohol use, or tobacco use.  Signals from the brain.These signals could be caused by a headache, heat exposure, an inner ear disturbance, increased pressure in the brain from injury, infection, a tumor, or a concussion, pain, emotional stimulus, or metabolic problems.  An obstruction in the gastrointestinal tract (bowel obstruction).  Illnesses such as diabetes, hepatitis, gallbladder problems, appendicitis, kidney problems, cancer, sepsis, atypical symptoms of a heart attack, or eating disorders.  Medical treatments such as chemotherapy and radiation.  Receiving medicine that makes you sleep (general anesthetic) during surgery. DIAGNOSIS Your caregiver may ask for tests to be done if the problems do not improve after a few days. Tests may also be done if symptoms are severe or if the reason for the nausea and vomiting is not clear. Tests may  include:  Urine tests.  Blood tests.  Stool tests.  Cultures (to look for evidence of infection).  X-rays or other imaging studies. Test results can help your caregiver make decisions about treatment or the need for additional tests. TREATMENT You need to stay well hydrated. Drink frequently but in small amounts.You may wish to drink water, sports drinks, clear broth, or eat frozen ice pops or gelatin dessert to help stay hydrated.When you eat, eating slowly may help prevent nausea.There are also some antinausea medicines that may help prevent nausea. HOME CARE INSTRUCTIONS   Take all medicine as directed by your caregiver.  If you do not have an appetite, do not force yourself to eat. However, you must continue to drink fluids.  If you have an appetite, eat a normal diet unless your caregiver tells you differently.  Eat a variety of complex carbohydrates (rice, wheat, potatoes, bread), lean meats, yogurt, fruits, and vegetables.  Avoid high-fat foods because they are more difficult to digest.  Drink enough water and fluids to keep your urine clear or pale yellow.  If you are dehydrated, ask your caregiver for specific rehydration instructions. Signs of dehydration may include:  Severe thirst.  Dry lips and mouth.  Dizziness.  Dark urine.  Decreasing urine frequency and amount.  Confusion.  Rapid breathing or pulse. SEEK IMMEDIATE MEDICAL CARE IF:   You have blood or brown flecks (like coffee grounds) in your vomit.  You have black or bloody stools.  You have a severe headache or stiff neck.  You are confused.  You have severe abdominal pain.  You have chest pain or trouble breathing.  You do not urinate at least once every 8 hours.  You develop cold or clammy skin.  You continue to vomit for longer than 24 to 48 hours.  You have  a fever. MAKE SURE YOU:   Understand these instructions.  Will watch your condition.  Will get help right away if  you are not doing well or get worse.   This information is not intended to replace advice given to you by your health care provider. Make sure you discuss any questions you have with your health care provider.   Document Released: 04/14/2005 Document Revised: 07/07/2011 Document Reviewed: 09/11/2010 Elsevier Interactive Patient Education [employer].

## 2015-02-28 ENCOUNTER — Encounter: Payer: Self-pay | Admitting: Physician Assistant

## 2015-02-28 LAB — TSH: TSH: 1.06 u[IU]/mL (ref 0.350–4.500)

## 2015-02-28 LAB — HEMOGLOBIN A1C
HEMOGLOBIN A1C: 5.4 % (ref <5.7)
Mean Plasma Glucose: 108 mg/dL (ref <117)

## 2015-02-28 LAB — VITAMIN D 25 HYDROXY (VIT D DEFICIENCY, FRACTURES): VIT D 25 HYDROXY: 71 ng/mL (ref 30–100)

## 2015-02-28 NOTE — Addendum Note (Signed)
Addended by: Vicie Mutters R on: 02/28/2015 07:29 AM   Modules accepted: Orders

## 2015-03-29 ENCOUNTER — Encounter: Payer: Self-pay | Admitting: Physician Assistant

## 2015-03-29 ENCOUNTER — Ambulatory Visit (INDEPENDENT_AMBULATORY_CARE_PROVIDER_SITE_OTHER): Payer: 59 | Admitting: Physician Assistant

## 2015-03-29 VITALS — BP 124/70 | HR 94 | Temp 97.3°F | Resp 16 | Ht 61.0 in | Wt 165.0 lb

## 2015-03-29 DIAGNOSIS — I10 Essential (primary) hypertension: Secondary | ICD-10-CM | POA: Diagnosis not present

## 2015-03-29 DIAGNOSIS — Z79899 Other long term (current) drug therapy: Secondary | ICD-10-CM

## 2015-03-29 DIAGNOSIS — E876 Hypokalemia: Secondary | ICD-10-CM

## 2015-03-29 NOTE — Patient Instructions (Signed)
Potassium Content of Foods  The body needs potassium to control blood pressure and to keep the muscles and nervous system healthy. Here are some healthy foods below that are high in potassium. Also you can get the white label salt of "NO SALT" salt substitute, 1/4 teaspoon of this is equivalent to 20meq potassium.   FOODS AND DRINKS HIGH IN POTASSIUM FOODS MODERATE IN POTASSIUM   Fruits Avocado (cubed),  c / 50 g. Banana (sliced), 75 g. Cantaloupe (cubed), 80 g. Honeydew, 1 wedge / 85 g. Kiwi (sliced), 90 g. Nectarine, 1 small / 129 g. Orange, 1 medium / 131 g. Vegetables Artichoke,  of a medium / 64 g. Asparagus (boiled), 90 g.. Broccoli (boiled), 78 g. Brussels sprout (boiled), 78 g. Butternut squash (baked), 103 g. Chickpea (cooked), 82 g. Green peas (cooked), 80 g. Kidney beans (cooked), 5 tbsp / 55 g. Lima beans (cooked),  c / 43 g. Navy beans (cooked),  c / 61 g. Spinach (cooked),  c / 45 g. Sweet potato (baked),  c / 50 g. Tomato (chopped or sliced), 90 g. Vegetable juice. White mushrooms (cooked), 78 g. Yam (cooked or baked),  c / 34 g. Zucchini squash (boiled), 90 g. Other Foods and Drinks Almonds (whole),  c / 36 g. Fish, 3 oz / 85 g. Nonfat fruit variety yogurt, 123 g. Pistachio nuts, 1 oz / 28 g. Pumpkin seeds, 1 oz / 28 g. Red meat (broiled, cooked, grilled), 3 oz / 85 g. Scallops (steamed), 3 oz / 85 g. Spaghetti sauce,  c / 66 g. Sunflower seeds (dry roasted), 1 oz / 28 g. Veggie burger, 1 patty / 70 g. Fruits Grapefruit,  of the fruit / 123 g Plums (sliced), 83 g. Tangerine, 1 large / 120 g. Vegetables Carrots (boiled), 78 g. Carrots (sliced), 61 g. Rhubarb (cooked with sugar), 120 g. Rutabaga (cooked), 120 g. Yellow snap beans (cooked), 63 g. Other Foods and Drinks  Chicken breast (roasted and chopped),  c / 70 g. Pita bread, 1 large / 64 g. Shrimp (steamed), 4 oz / 113 g. Swiss cheese (diced), 70 g.     

## 2015-03-29 NOTE — Progress Notes (Signed)
Subjective:    Patient ID: Kelly Goodwin, female    DOB: 02/08/1961, 54 y.o.   MRN: [id number]  HPI 54 y.o. AAF had hypokalemia/hypomagnesium last visit, she is on HCTZ, has been back on potassium OTC, has increased diet, and she is on 2 pills on magnesium a night.   Blood pressure 124/70, pulse 94, temperature 97.3 F (36.3 C), temperature source Temporal, resp. rate 16, height 5\' 1"  (1.549 m), weight 165 lb (74.844 kg), SpO2 99 %.  Current Outpatient Prescriptions on File Prior to Visit  Medication Sig Dispense Refill  . Aspirin (STANBACK HEADACHE POWDER PO) Take by mouth.    Marland Kitchen CALCIUM-VITAMIN D PO Take 1 tablet by mouth daily.    . canagliflozin (INVOKANA) 300 MG TABS tablet Take 300 mg by mouth daily. 30 tablet 3  . estradiol-norethindrone (ACTIVELLA) 1-0.5 MG per tablet Take 1 tablet by mouth  daily 84 tablet 3  . fluconazole (DIFLUCAN) 150 MG tablet Take 1 tablet (150 mg total) by mouth daily. 1 tablet 3  . GARLIC PO Take 1 tablet by mouth 2 (two) times daily.      Marland Kitchen levothyroxine (SYNTHROID, LEVOTHROID) 100 MCG tablet Take 1 tablet (100 mcg total) by mouth daily before breakfast. 90 tablet 3  . losartan-hydrochlorothiazide (HYZAAR) 100-25 MG tablet Take 1 tablet by mouth  daily 90 tablet 0  . LYSINE PO Take 1 tablet by mouth at bedtime.      . metFORMIN (GLUCOPHAGE) 1000 MG tablet Take 1 tablet by mouth  twice a day for diabetes 180 tablet 3  . Multiple Vitamins-Minerals (MULTIVITAMIN WITH MINERALS) tablet Take 1 tablet by mouth daily.      . ondansetron (ZOFRAN) 4 MG tablet Take 1 tablet (4 mg total) by mouth daily as needed for nausea or vomiting. 30 tablet 1  . pantoprazole (PROTONIX) 40 MG tablet Take 1 tablet by mouth  every day 90 tablet 1  . ranitidine (ZANTAC) 300 MG tablet Take 1 tablet (300 mg total) by mouth at bedtime. 30 tablet 1  . scopolamine (TRANSDERM-SCOP, 1.5 MG,) 1 MG/3DAYS Place 1 patch (1.5 mg total) onto the skin every 3 (three) days. 4 patch 0  . temazepam  (RESTORIL) 30 MG capsule TAKE 1 CAPSULE BY MOUTH ONCE DAILY AT BEDTIME 90 capsule 3  . valACYclovir (VALTREX) 500 MG tablet Take 1 tablet (500 mg total) by mouth 2 (two) times daily as needed. 180 tablet 3   No current facility-administered medications on file prior to visit.   Past Medical History  Diagnosis Date  . Constipation, chronic   . PCOS (polycystic ovarian syndrome)     takes metformin for this  . Hypertension   . Fibroid uterus   . Diverticulitis   . Migraine   . History of gallstones   . Diverticulosis   . Type II or unspecified type diabetes mellitus without mention of complication, not stated as uncontrolled   . Thyroid disease   . Hypothyroidism   . Hyperlipidemia   . Arthritis   . Prediabetes      Review of Systems  Constitutional: Negative.  Negative for fever and chills.  HENT: Positive for congestion.   Respiratory: Negative.   Cardiovascular: Negative.   Gastrointestinal: Negative for nausea, vomiting, abdominal pain, constipation and blood in stool.  Genitourinary: Negative.   Musculoskeletal: Negative.   Skin: Negative.   Neurological: Negative.   Psychiatric/Behavioral: Negative.        Objective:   Physical Exam  Constitutional: She  is oriented to person, place, and time. She appears well-developed and well-nourished.  HENT:  Head: Normocephalic and atraumatic.  Right Ear: External ear normal.  Left Ear: External ear normal.  Mouth/Throat: Oropharynx is clear and moist.  Eyes: Conjunctivae and EOM are normal. Pupils are equal, round, and reactive to light.  Neck: Normal range of motion. Neck supple. No thyromegaly present.  Cardiovascular: Normal rate, regular rhythm and normal heart sounds.  Exam reveals no gallop and no friction rub.   No murmur heard. Pulmonary/Chest: Effort normal and breath sounds normal. No respiratory distress. She has no wheezes.  Abdominal: Soft. Bowel sounds are normal. She exhibits no distension and no mass. There  is no tenderness. There is no rebound and no guarding.  Musculoskeletal: Normal range of motion.  Lymphadenopathy:    She has no cervical adenopathy.  Neurological: She is alert and oriented to person, place, and time. She displays normal reflexes. No cranial nerve deficit. Coordination normal.  Skin: Skin is warm and dry.  Psychiatric: She has a normal mood and affect.       Assessment & Plan:  1. . Hypokalemia - BASIC METABOLIC PANEL WITH GFR May have to take out/decrease HCTZ  2. Hypomagnesemia - Magnesium  Future Appointments Date Time Provider [place]  04/02/2015 4:20 PM [facility] 3 [facility]  10/08/2015 2:00 PM Vicie Mutters, PA-C [id number] None

## 2015-03-30 LAB — MAGNESIUM: Magnesium: 2.2 mg/dL (ref 1.5–2.5)

## 2015-03-30 LAB — BASIC METABOLIC PANEL WITH GFR
BUN: 17 mg/dL (ref 7–25)
CO2: 31 mmol/L (ref 20–31)
CREATININE: 0.62 mg/dL (ref 0.50–1.05)
Calcium: 9.6 mg/dL (ref 8.6–10.4)
Chloride: 99 mmol/L (ref 98–110)
GFR, Est Non African American: >89 mL/min (ref >=60)
GLUCOSE: 72 mg/dL (ref 65–99)
POTASSIUM: 3.9 mmol/L (ref 3.5–5.3)
Sodium: 140 mmol/L (ref 135–146)

## 2015-04-02 ENCOUNTER — Ambulatory Visit
Admission: RE | Admit: 2015-04-02 | Discharge: 2015-04-02 | Disposition: A | Discharge status: Home / Self Care | Payer: 59 | Source: Ambulatory Visit

## 2015-04-02 DIAGNOSIS — Z1231 Encounter for screening mammogram for malignant neoplasm of breast: Secondary | ICD-10-CM

## 2015-04-04 ENCOUNTER — Other Ambulatory Visit: Payer: Self-pay | Admitting: Physician Assistant

## 2015-04-04 DIAGNOSIS — R928 Other abnormal and inconclusive findings on diagnostic imaging of breast: Secondary | ICD-10-CM

## 2015-04-10 ENCOUNTER — Other Ambulatory Visit: Payer: 59

## 2015-04-12 ENCOUNTER — Ambulatory Visit (INDEPENDENT_AMBULATORY_CARE_PROVIDER_SITE_OTHER): Payer: 59 | Admitting: Physician Assistant

## 2015-04-12 ENCOUNTER — Encounter: Payer: Self-pay | Admitting: Physician Assistant

## 2015-04-12 VITALS — BP 130/90 | HR 112 | Temp 97.7°F | Resp 16 | Ht 61.0 in | Wt 168.0 lb

## 2015-04-12 DIAGNOSIS — J0100 Acute maxillary sinusitis, unspecified: Secondary | ICD-10-CM

## 2015-04-12 DIAGNOSIS — J029 Acute pharyngitis, unspecified: Secondary | ICD-10-CM

## 2015-04-12 MED ORDER — PROMETHAZINE-CODEINE 6.25-10 MG/5ML PO SYRP: 5.0000 mL | ORAL_SOLUTION | Freq: Four times a day (QID) | ORAL | Status: DC | PRN

## 2015-04-12 MED ORDER — PREDNISONE 20 MG PO TABS: ORAL_TABLET | ORAL | Status: DC

## 2015-04-12 MED ORDER — LEVOFLOXACIN 500 MG PO TABS: 500.0000 mg | ORAL_TABLET | Freq: Every day | ORAL | Status: DC

## 2015-04-12 NOTE — Patient Instructions (Addendum)
-Make sure you are drinking plenty of fluids to stay hydrated.  -while drinking fluids pinch and hold nose close and swallow, to help open eustachian tubes and drain fluid behind ear drums. -you can do salt water gargles. You can also do 1 TSP liquid Maalox and 1 TSP liquid benadryl- mix/ gargle/ spit  Sinusitis can be uncomfortable. People with sinusitis have congestion with yellow/green/gray discharge, sinus pain/pressure, pain around the eyes. Sinus infections almost ALWAYS stem from a viral infection and antibiotics don't work against a virus. Even when bacteria is responsible, the infections usually clear up on their own in a week or so.   PLEASE TRY TO DO OVER THE COUNTER TREATMENT AND PREDNISONE FOR 5-7 DAYS AND IF YOU ARE NOT GETTING BETTER OR GETTING WORSE THEN YOU CAN START ON AN ANTIBIOTIC GIVEN.  Can take the prednisone AT NIGHT WITH DINNER, it take 8-12 hours to start working so it will NOT affect your sleeping if you take it at night with your food!! Take two pills the first night and 1 or two pill the second night and then 1 pill the other nights.   Risk of antibiotic use: About 1 in 4 people who take antibiotics have side effects including stomach problems, dizziness, or rashes. Those problems clear up soon after stopping the drugs, but in rare cases antibiotics can cause severe allergic reaction. Over use of antibiotics also encourages the growth of bacteria that can't be controlled easily with drugs. That makes you more vunerable to antibiotic-resistant infections and undermines the benefits of antibiotics for others.   Waste of Money: Antibiotics often aren't very expensive, but any money spent on unnecessary drugs is money down the drain.   When are antibiotics needed? Only when symptoms last longer than a week.  Start to improve but then worsen again  -It can take up to 2 weeks to feel better.   -If you do not get better in 7-10 days (Have fever, facial pain, dental pain  and swelling), then please call the office and it is now appropriate to start an antibiotic.   -Please take Tylenol or Ibuprofen for pain. -Acetaminiphen 325mg  orally every 4-6 hours for pain.  Max: 10 per day -Ibuprofen 200mg  orally every 6-8 hours for pain.  Take with food to avoid ulcers.   Max 10 per day  Please pick one of the over the counter allergy medications below and take it once daily for allergies.  Claritin or loratadine cheapest but likely the weakest  Zyrtec or certizine at night because it can make you sleepy The strongest is allegra or fexafinadine  Cheapest at [employer], [employer], [employer]  -While drinking fluids, pinch and hold nose close and swallow.  This will help open up your eustachian tubes to drain the fluid behind your ear drums. -Try steam showers to open your nasal passages.   Drink lots of water to stay hydrated and to thin mucous.  Flonase/Nasonex is to help the inflammation.  Take 2 sprays in each nostril at bedtime.  Make sure you spray towards the outside of each nostril towards the outer corner of your eye, hold nose close and tilt head back.  This will help the medication get into your sinuses.  If you do not like this medication, then use saline nasal sprays same directions as above for Flonase. Stop the medication right away if you get blurring of your vision or nose bleeds.  Sinusitis Sinusitis is redness, soreness, and inflammation of the paranasal sinuses. Paranasal  sinuses are air pockets within the bones of your face (beneath the eyes, the middle of the forehead, or above the eyes). In healthy paranasal sinuses, mucus is able to drain out, and air is able to circulate through them by way of your nose. However, when your paranasal sinuses are inflamed, mucus and air can become trapped. This can allow bacteria and other germs to grow and cause infection. Sinusitis can develop quickly and last only a short time (acute) or continue over a long period (chronic).  Sinusitis that lasts for more than 12 weeks is considered chronic.  CAUSES  Causes of sinusitis include: Allergies. Structural abnormalities, such as displacement of the cartilage that separates your nostrils (deviated septum), which can decrease the air flow through your nose and sinuses and affect sinus drainage. Functional abnormalities, such as when the small hairs (cilia) that line your sinuses and help remove mucus do not work properly or are not present. SIGNS AND SYMPTOMS  Symptoms of acute and chronic sinusitis are the same. The primary symptoms are pain and pressure around the affected sinuses. Other symptoms include: Upper toothache. Earache. Headache. Bad breath. Decreased sense of smell and taste. A cough, which worsens when you are lying flat. Fatigue. Fever. Thick drainage from your nose, which often is green and may contain pus (purulent). Swelling and warmth over the affected sinuses. DIAGNOSIS  Your health care provider will perform a physical exam. During the exam, your health care provider may: Look in your nose for signs of abnormal growths in your nostrils (nasal polyps).  Tap over the affected sinus to check for signs of infection. View the inside of your sinuses (endoscopy) using an imaging device that has a light attached (endoscope). If your health care provider suspects that you have chronic sinusitis, one or more of the following tests may be recommended: Allergy tests. Nasal culture. A sample of mucus is taken from your nose, sent to a lab, and screened for bacteria. Nasal cytology. A sample of mucus is taken from your nose and examined by your health care provider to determine if your sinusitis is related to an allergy. TREATMENT  Most cases of acute sinusitis are related to a viral infection and will resolve on their own within 10 days. Sometimes medicines are prescribed to help relieve symptoms (pain medicine, decongestants, nasal steroid sprays, or saline  sprays).  However, for sinusitis related to a bacterial infection, your health care provider will prescribe antibiotic medicines. These are medicines that will help kill the bacteria causing the infection.  Rarely, sinusitis is caused by a fungal infection. In theses cases, your health care provider will prescribe antifungal medicine. For some cases of chronic sinusitis, surgery is needed. Generally, these are cases in which sinusitis recurs more than 3 times per year, despite other treatments. HOME CARE INSTRUCTIONS  Drink plenty of water. Water helps thin the mucus so your sinuses can drain more easily. Use a humidifier. Inhale steam 3 to 4 times a day (for example, sit in the bathroom with the shower running). Apply a warm, moist washcloth to your face 3 to 4 times a day, or as directed by your health care provider. Use saline nasal sprays to help moisten and clean your sinuses. Take medicines only as directed by your health care provider. If you were prescribed either an antibiotic or antifungal medicine, finish it all even if you start to feel better. SEEK IMMEDIATE MEDICAL CARE IF: You have increasing pain or severe headaches. You have nausea, vomiting,  or drowsiness. You have swelling around your face. You have vision problems. You have a stiff neck. You have difficulty breathing. MAKE SURE YOU:  Understand these instructions. Will watch your condition. Will get help right away if you are not doing well or get worse. Document Released: 04/14/2005 Document Revised: 08/29/2013 Document Reviewed: 04/29/2011 [facility] Patient Information 2015 [place], [place]. This information is not intended to replace advice given to you by your health care provider. Make sure you discuss any questions you have with your health care provider.     Pharyngitis Pharyngitis is redness, pain, and swelling (inflammation) of your pharynx.  CAUSES  Pharyngitis is usually caused by infection. Most of the  time, these infections are from viruses (viral) and are part of a cold. However, sometimes pharyngitis is caused by bacteria (bacterial). Pharyngitis can also be caused by allergies. Viral pharyngitis may be spread from person to person by coughing, sneezing, and personal items or utensils (cups, forks, spoons, toothbrushes). Bacterial pharyngitis may be spread from person to person by more intimate contact, such as kissing.  SIGNS AND SYMPTOMS  Symptoms of pharyngitis include:   Sore throat.   Tiredness (fatigue).   Low-grade fever.   Headache.  Joint pain and muscle aches.  Skin rashes.  Swollen lymph nodes.  Plaque-like film on throat or tonsils (often seen with bacterial pharyngitis). DIAGNOSIS  Your health care provider will ask you questions about your illness and your symptoms. Your medical history, along with a physical exam, is often all that is needed to diagnose pharyngitis. Sometimes, a rapid strep test is done. Other lab tests may also be done, depending on the suspected cause.  TREATMENT  Viral pharyngitis will usually get better in 3-4 days without the use of medicine. Bacterial pharyngitis is treated with medicines that kill germs (antibiotics).  HOME CARE INSTRUCTIONS   Drink enough water and fluids to keep your urine clear or pale yellow.   Only take over-the-counter or prescription medicines as directed by your health care provider:   If you are prescribed antibiotics, make sure you finish them even if you start to feel better.   Do not take aspirin.   Get lots of rest.   Gargle with 8 oz of salt water ( tsp of salt per 1 qt of water) as often as every 1-2 hours to soothe your throat.   Throat lozenges (if you are not at risk for choking) or sprays may be used to soothe your throat. SEEK MEDICAL CARE IF:   You have large, tender lumps in your neck.  You have a rash.  You cough up green, yellow-brown, or bloody spit. SEEK IMMEDIATE  MEDICAL CARE IF:   Your neck becomes stiff.  You drool or are unable to swallow liquids.  You vomit or are unable to keep medicines or liquids down.  You have severe pain that does not go away with the use of recommended medicines.  You have trouble breathing (not caused by a stuffy nose). MAKE SURE YOU:   Understand these instructions.  Will watch your condition.  Will get help right away if you are not doing well or get worse. Document Released: 04/14/2005 Document Revised: 02/02/2013 Document Reviewed: 12/20/2012 [facility] Patient Information 2015 [place], [place]. This information is not intended to replace advice given to you by your health care provider. Make sure you discuss any questions you have with your health care provider.

## 2015-04-12 NOTE — Progress Notes (Signed)
Subjective:    Patient ID: Kelly Goodwin, female    DOB: 01/05/1961, 54 y.o.   MRN: [id number]  HPI 54 y.o. AAF presents with sinus congestion. Has been getting worse x 3 weeks. Nasal congestion, sore throat, ear pain right worse than left, has had chills but no fever. No cough, SOB, CP. Has tried alk cold +, halls cough drops, robitussin, dayquil.   Blood pressure 130/90, pulse 112, temperature 97.7 F (36.5 C), temperature source Temporal, resp. rate 16, height _0  (1.549 m), weight 168 lb (76.204 kg), SpO2 97 %.  Past Medical History  Diagnosis Date  . Constipation, chronic   . PCOS (polycystic ovarian syndrome)     takes metformin for this  . Hypertension   . Fibroid uterus   . Diverticulitis   . Migraine   . History of gallstones   . Diverticulosis   . Type II or unspecified type diabetes mellitus without mention of complication, not stated as uncontrolled   . Thyroid disease   . Hypothyroidism   . Hyperlipidemia   . Arthritis   . Prediabetes    Current Outpatient Prescriptions on File Prior to Visit  Medication Sig Dispense Refill  . Aspirin (STANBACK HEADACHE POWDER PO) Take by mouth.    Marland Kitchen CALCIUM-VITAMIN D PO Take 1 tablet by mouth daily.    . canagliflozin (INVOKANA) 300 MG TABS tablet Take 300 mg by mouth daily. 30 tablet 3  . estradiol-norethindrone (ACTIVELLA) 1-0.5 MG per tablet Take 1 tablet by mouth  daily 84 tablet 3  . fluconazole (DIFLUCAN) 150 MG tablet Take 1 tablet (150 mg total) by mouth daily. 1 tablet 3  . GARLIC PO Take 1 tablet by mouth 2 (two) times daily.      Marland Kitchen levothyroxine (SYNTHROID, LEVOTHROID) 100 MCG tablet Take 1 tablet (100 mcg total) by mouth daily before breakfast. 90 tablet 3  . losartan-hydrochlorothiazide (HYZAAR) 100-25 MG tablet Take 1 tablet by mouth  daily 90 tablet 0  . LYSINE PO Take 1 tablet by mouth at bedtime.      . metFORMIN (GLUCOPHAGE) 1000 MG tablet Take 1 tablet by mouth  twice a day for diabetes 180 tablet 3  .  Multiple Vitamins-Minerals (MULTIVITAMIN WITH MINERALS) tablet Take 1 tablet by mouth daily.      . ondansetron (ZOFRAN) 4 MG tablet Take 1 tablet (4 mg total) by mouth daily as needed for nausea or vomiting. 30 tablet 1  . pantoprazole (PROTONIX) 40 MG tablet Take 1 tablet by mouth  every day 90 tablet 1  . ranitidine (ZANTAC) 300 MG tablet Take 1 tablet (300 mg total) by mouth at bedtime. 30 tablet 1  . scopolamine (TRANSDERM-SCOP, 1.5 MG,) 1 MG/3DAYS Place 1 patch (1.5 mg total) onto the skin every 3 (three) days. 4 patch 0  . temazepam (RESTORIL) 30 MG capsule TAKE 1 CAPSULE BY MOUTH ONCE DAILY AT BEDTIME 90 capsule 3  . valACYclovir (VALTREX) 500 MG tablet Take 1 tablet (500 mg total) by mouth 2 (two) times daily as needed. 180 tablet 3   No current facility-administered medications on file prior to visit.    Review of Systems  Constitutional: Positive for chills. Negative for fever and diaphoresis.  HENT: Positive for congestion, ear pain, sinus pressure and sore throat. Negative for sneezing.   Respiratory: Negative.  Negative for cough and shortness of breath.   Cardiovascular: Negative.   Musculoskeletal: Positive for neck pain.  Neurological: Negative.  Negative for headaches.  Objective:   Physical Exam  Constitutional: She appears well-developed and well-nourished.  HENT:  Head: Normocephalic and atraumatic.  Right Ear: External ear normal.  Left Ear: External ear normal.  Mouth/Throat: Uvula is midline and mucous membranes are normal. Posterior oropharyngeal edema and posterior oropharyngeal erythema present.  Eyes: Conjunctivae and EOM are normal. Pupils are equal, round, and reactive to light.  Neck: Normal range of motion. Neck supple.  Cardiovascular: Normal rate, regular rhythm and normal heart sounds.   Pulmonary/Chest: Effort normal and breath sounds normal.  Abdominal: Soft. Bowel sounds are normal.  Lymphadenopathy:    She has cervical adenopathy.  Skin:  Skin is warm and dry.      Assessment & Plan:  1. Acute maxillary sinusitis, recurrence not specified/Pharyngitis - levofloxacin (LEVAQUIN) 500 MG tablet; Take 1 tablet (500 mg total) by mouth daily.  Dispense: 10 tablet; Refill: 0 - promethazine-codeine (PHENERGAN WITH CODEINE) 6.25-10 MG/5ML syrup; Take 5 mLs by mouth every 6 (six) hours as needed for cough. Max: 58m per day  Dispense: 240 mL; Refill: 0 - predniSONE (DELTASONE) 20 MG tablet; 2 tablets daily for 3 days, 1 tablet daily for 4 days.  Dispense: 10 tablet; Refill: 0

## 2015-04-13 ENCOUNTER — Ambulatory Visit
Admission: RE | Admit: 2015-04-13 | Discharge: 2015-04-13 | Disposition: A | Discharge status: Home / Self Care | Payer: 59 | Source: Ambulatory Visit | Attending: Physician Assistant | Admitting: Physician Assistant

## 2015-04-13 ENCOUNTER — Other Ambulatory Visit: Payer: 59

## 2015-04-13 DIAGNOSIS — R928 Other abnormal and inconclusive findings on diagnostic imaging of breast: Secondary | ICD-10-CM

## 2015-04-20 ENCOUNTER — Other Ambulatory Visit: Payer: 59

## 2015-05-14 ENCOUNTER — Other Ambulatory Visit: Payer: Self-pay | Admitting: Physician Assistant

## 2015-05-14 MED ORDER — ALPRAZOLAM 0.5 MG PO TABS: 0.5000 mg | ORAL_TABLET | Freq: Three times a day (TID) | ORAL | Status: DC | PRN

## 2015-05-16 ENCOUNTER — Other Ambulatory Visit: Payer: Self-pay | Admitting: Physician Assistant

## 2015-05-21 ENCOUNTER — Ambulatory Visit (INDEPENDENT_AMBULATORY_CARE_PROVIDER_SITE_OTHER): Payer: 59 | Admitting: Physician Assistant

## 2015-05-21 ENCOUNTER — Encounter: Payer: Self-pay | Admitting: Physician Assistant

## 2015-05-21 VITALS — BP 140/90 | HR 95 | Temp 97.5°F | Resp 16 | Ht 61.0 in | Wt 163.0 lb

## 2015-05-21 DIAGNOSIS — F32A Depression, unspecified: Secondary | ICD-10-CM

## 2015-05-21 DIAGNOSIS — F329 Major depressive disorder, single episode, unspecified: Secondary | ICD-10-CM

## 2015-05-21 DIAGNOSIS — F4321 Adjustment disorder with depressed mood: Secondary | ICD-10-CM

## 2015-05-21 DIAGNOSIS — F4329 Adjustment disorder with other symptoms: Secondary | ICD-10-CM

## 2015-05-21 DIAGNOSIS — Z634 Disappearance and death of family member: Secondary | ICD-10-CM

## 2015-05-21 MED ORDER — BUPROPION HCL ER (XL) 150 MG PO TB24: 150.0000 mg | ORAL_TABLET | ORAL | Status: DC

## 2015-05-21 MED ORDER — BUSPIRONE HCL 10 MG PO TABS: 10.0000 mg | ORAL_TABLET | Freq: Three times a day (TID) | ORAL | Status: DC

## 2015-05-21 NOTE — Progress Notes (Signed)
Assessment and Plan: Depression/distress- xanax PRN, will fill out FMLA due to decreased concentration, grief, will follow up 1 month, suggest hospice counseling/counseling. Wellbutrin and buspar, does not want to take xanax   HPI 55 y.o.female presents for FMLA forms and anxiety/grief. Her mother passed on 05-25-22 from complications from hernia repair, suddenly with her there. She is struggling with crying, unable to concentrate. 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   Past Medical History  Diagnosis Date  . Constipation, chronic   . PCOS (polycystic ovarian syndrome)     takes metformin for this  . Hypertension   . Fibroid uterus   . Diverticulitis   . Migraine   . History of gallstones   . Diverticulosis   . Type II or unspecified type diabetes mellitus without mention of complication, not stated as uncontrolled   . Thyroid disease   . Hypothyroidism   . Hyperlipidemia   . Arthritis   . Prediabetes      No Known Allergies    Current Outpatient Prescriptions on File Prior to Visit  Medication Sig Dispense Refill  . ALPRAZolam (XANAX) 0.5 MG tablet Take 1 tablet (0.5 mg total) by mouth 3 (three) times daily as needed for sleep or anxiety. 30 tablet 0  . Aspirin (STANBACK HEADACHE POWDER PO) Take by mouth.    Marland Kitchen CALCIUM-VITAMIN D PO Take 1 tablet by mouth daily.    . canagliflozin (INVOKANA) 300 MG TABS tablet Take 300 mg by mouth daily. 30 tablet 3  . estradiol-norethindrone (ACTIVELLA) 1-0.5 MG per tablet Take 1 tablet by mouth  daily 84 tablet 3  . GARLIC PO Take 1 tablet by mouth 2 (two) times daily.      Marland Kitchen levofloxacin (LEVAQUIN) 500 MG tablet Take 1 tablet (500 mg total) by mouth daily. 10 tablet 0  . levothyroxine (SYNTHROID, LEVOTHROID) 100 MCG tablet Take 1 tablet (100 mcg total) by  mouth daily before breakfast. 90 tablet 3  . losartan-hydrochlorothiazide (HYZAAR) 100-25 MG tablet Take 1 tablet by mouth  daily 90 tablet 2  . LYSINE PO Take 1 tablet by mouth at bedtime.      . metFORMIN (GLUCOPHAGE) 1000 MG tablet Take 1 tablet by mouth  twice a day for diabetes 180 tablet 3  . Multiple Vitamins-Minerals (MULTIVITAMIN WITH MINERALS) tablet Take 1 tablet by mouth daily.      . pantoprazole (PROTONIX) 40 MG tablet Take 1 tablet by mouth  every day 90 tablet 1  . promethazine-codeine (PHENERGAN WITH CODEINE) 6.25-10 MG/5ML syrup Take 5 mLs by mouth every 6 (six) hours as needed for cough. Max: 17mL per day 240 mL 0  . ranitidine (ZANTAC) 300 MG tablet Take 1 tablet (300 mg total) by mouth at bedtime. 30 tablet 1  . scopolamine (TRANSDERM-SCOP, 1.5 MG,) 1 MG/3DAYS Place 1 patch (1.5 mg total) onto the skin every 3 (three) days. 4 patch 0  . temazepam (RESTORIL) 30 MG capsule TAKE 1 CAPSULE BY MOUTH ONCE DAILY AT BEDTIME 90 capsule 3  . valACYclovir (VALTREX) 500 MG tablet Take 1 tablet (500 mg total) by mouth 2 (two) times daily as needed. 180 tablet 3   No current facility-administered medications on file prior to visit.    ROS: all negative except above.   Physical Exam:  Filed Weights   05/21/15 0945  Weight: 163 lb (73.936 kg)   BP 140/90 mmHg  Pulse 95  Temp(Src) 97.5 F (36.4 C) (Temporal)  Resp 16  Ht 5\' 1"  (1.549 m)  Wt 163 lb (73.936 kg)  BMI 30.81 kg/m2  SpO2 99% General Appearance: Well nourished, in no apparent distress. Eyes: PERRLA, EOMs, conjunctiva no swelling or erythema Sinuses: No Frontal/maxillary tenderness ENT/Mouth: Ext aud canals clear, TMs without erythema, bulging. No erythema, swelling, or exudate on post pharynx.  Tonsils not swollen or erythematous. Hearing normal.  Neck: Supple, thyroid normal.  Respiratory: Respiratory effort normal, BS equal bilaterally without rales, rhonchi, wheezing or stridor.  Cardio: RRR with no MRGs. Brisk  peripheral pulses without edema.  Abdomen: Soft, + BS.  Non tender, no guarding, rebound, hernias, masses. Lymphatics: Non tender without lymphadenopathy.  Musculoskeletal: Full ROM, 5/5 strength, normal gait.  Skin: Warm, dry without rashes, lesions, ecchymosis.  Neuro: Cranial nerves intact. Normal muscle tone, no cerebellar symptoms. Sensation intact.  Psych: Awake and oriented X 3, patient crying, Insight and Judgment appropriate.     Vicie Mutters, PA-C 12:39 PM [facility]

## 2015-05-21 NOTE — Patient Instructions (Signed)
[facility] Address: [address] [place], [place] [address]  Phone: [phone]  Counseling services  Dr. Arbutus Leas, Ph.D. [address], [place] [address] Phone: [phone], [place] [id number] [place] [address] [place] [address]   [facility] Hours: Monday-Thursday 830-8pm  Friday 830AM-7PM Address: [place] [phone] [phone].  Address: [place], [place] [address] [place] [facility] [phone] office [email or web address] [address], [place], [place], [place] [address]  Rema Fendt, therapist  Toy Cookey, MA, clinical psychologist  Cognitive-Behavior Therapy; Mood Disorders; Anxiety Disorders; adult and child ADHD; Family Therapy; Stress Management; personal growth, and Marital Therapy.    Terrance Mass Ph.D., clinical psychologist Cognitive-Behavior Therapy; Mood Disorders; Anxiety Disorders; Stress     Management  [facility] [address] [place], [place] [address] [phone]   [facility] [place], psychotherapist [address] [place], [place] [address] [phone]  Karin Golden Ph.D., clinical psychologist [phone] office [place], [place] [address] Cognitive Behavior Therapy, Depression, Bipolar, Anxiety, Grief and Loss

## 2015-05-26 IMAGING — CR DG SHOULDER 2+V*L*
3 series · 3 of 3 positions shown · non-contrast
Comparison: 11/28/2011 chest x-ray.

CLINICAL DATA: 53-year-old female with left shoulder pain since
last night. No known injury. Initial encounter.

EXAM:
LEFT SHOULDER - 2+ VIEW

[AP (1 of 2)]
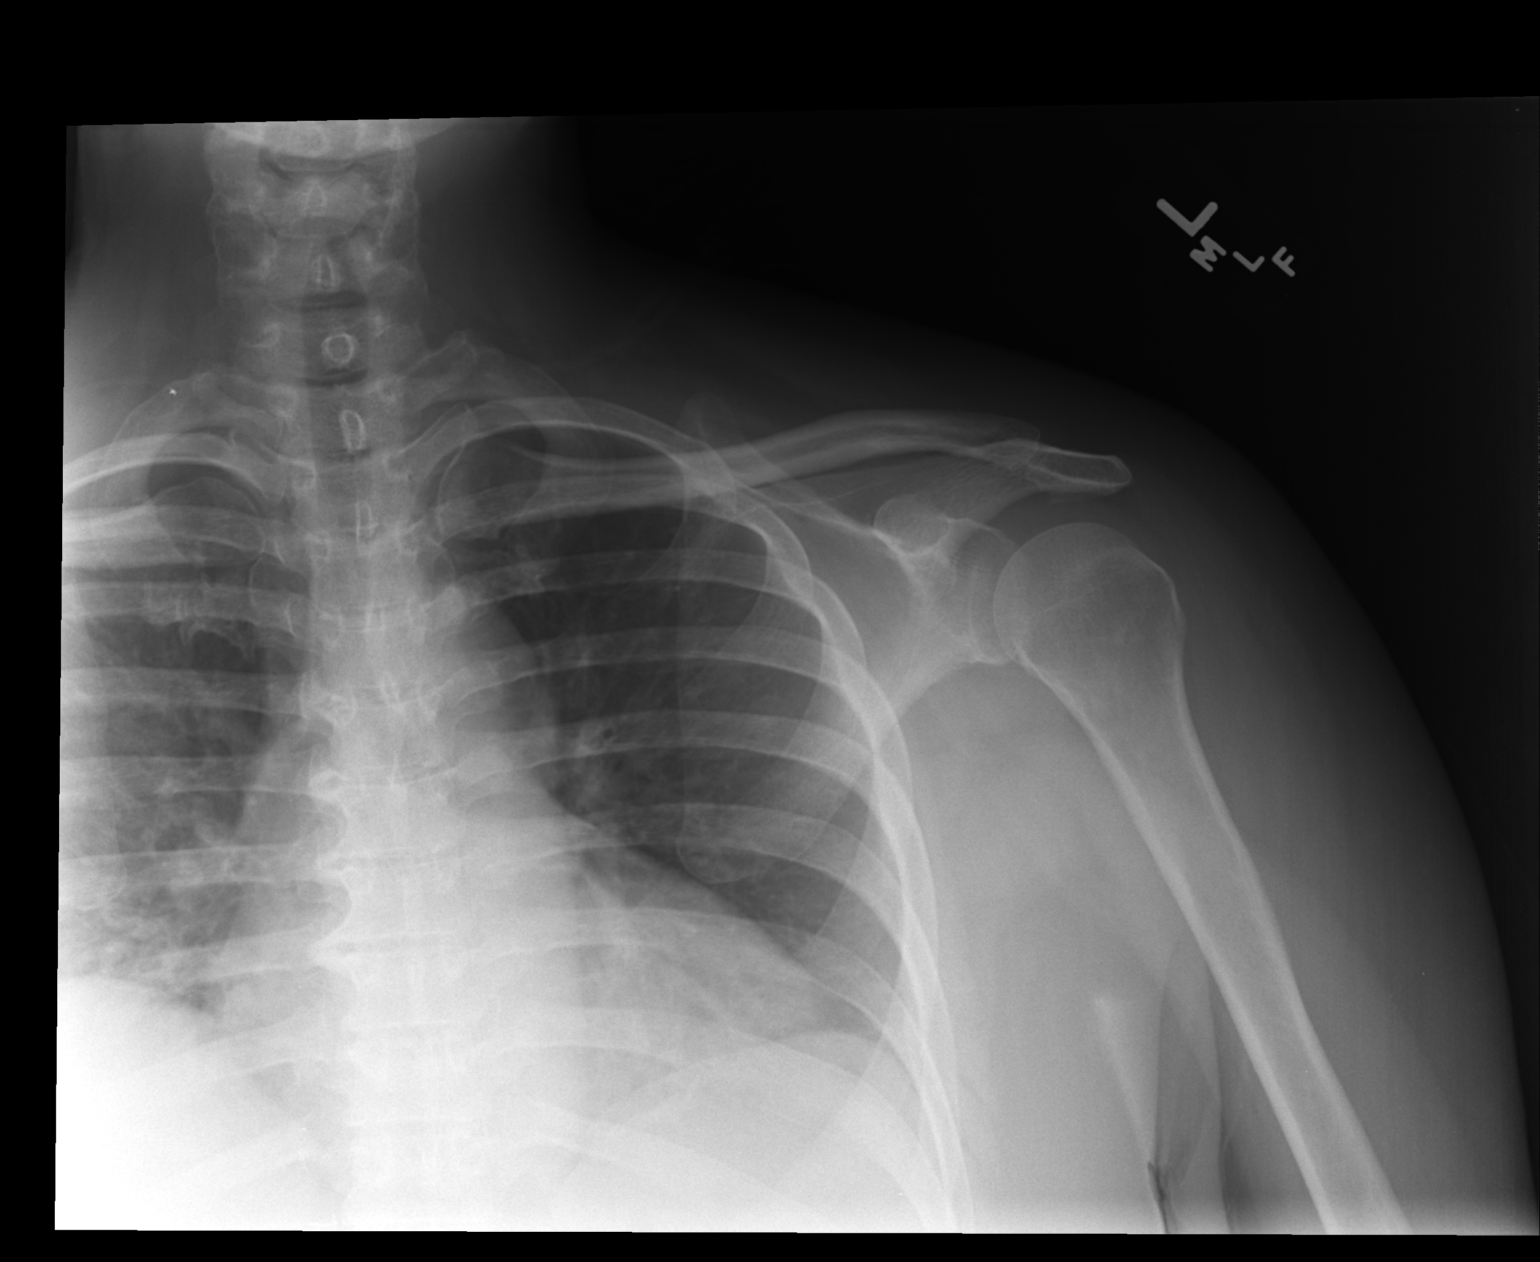

[AP (2 of 2)]
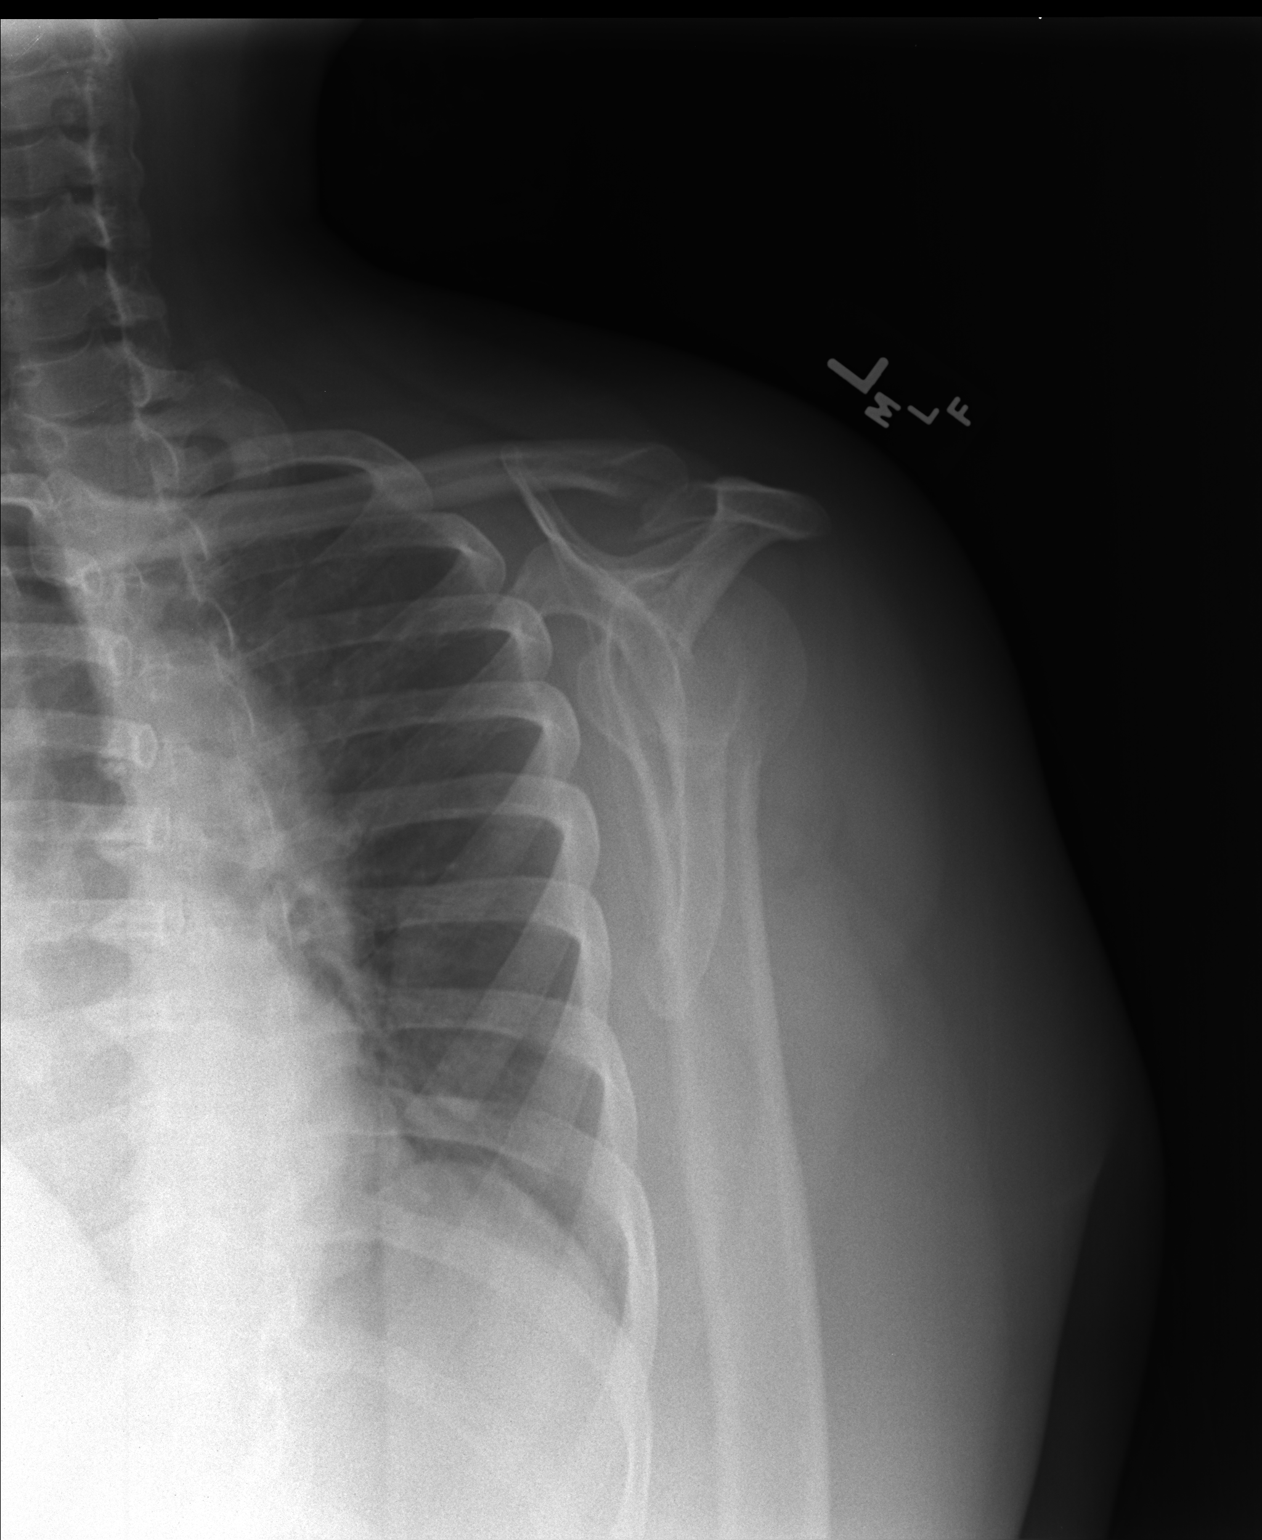

[axial]
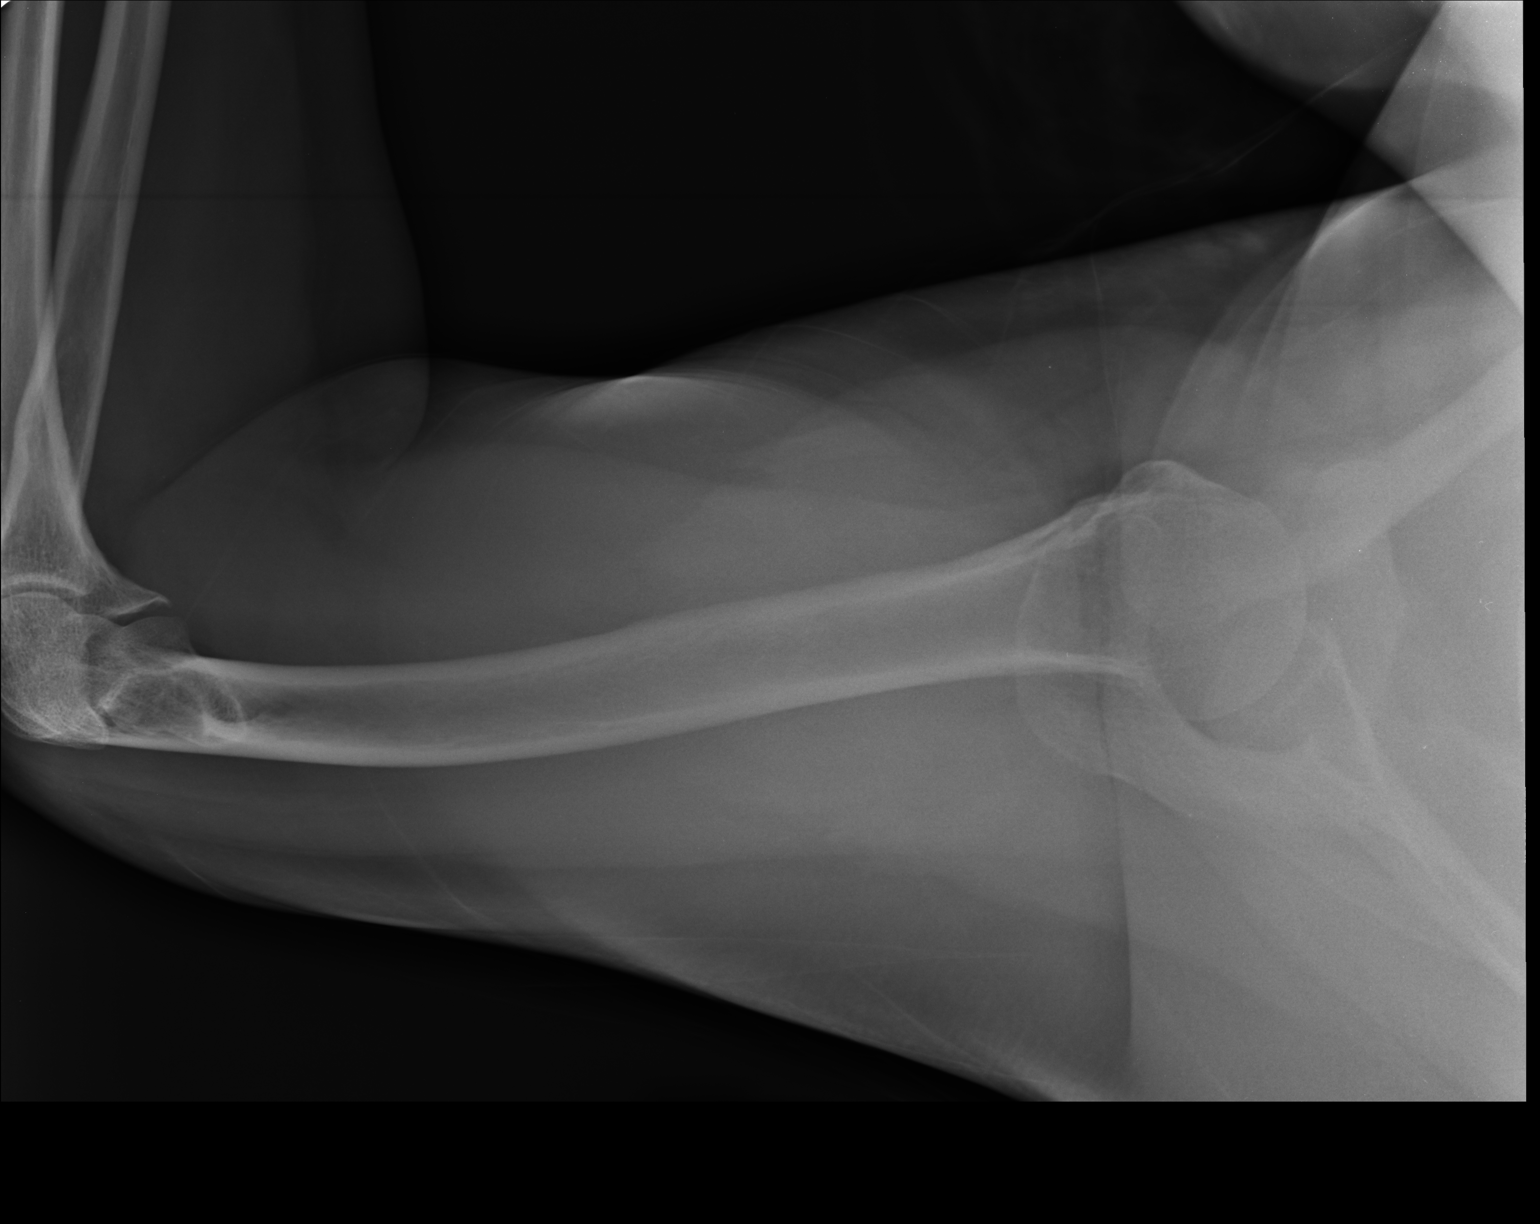

[3 of 3 positions shown; findings below may reference images not displayed]

FINDINGS: No fracture or dislocation.

Mild curvature undersurface of the anterior aspect of the chromium.
This may contribute to rotator cuff problems.

No abnormal soft tissue calcifications.

Visualized lungs clear.

Slightly tortuous aorta.
IMPRESSION: No fracture or dislocation.

Mild curvature undersurface of the anterior aspect of the chromium.
This may contribute to rotator cuff problems.

## 2015-06-03 ENCOUNTER — Other Ambulatory Visit: Payer: Self-pay | Admitting: Internal Medicine

## 2015-06-04 ENCOUNTER — Other Ambulatory Visit: Payer: Self-pay | Admitting: *Deleted

## 2015-06-04 MED ORDER — TEMAZEPAM 30 MG PO CAPS: ORAL_CAPSULE | ORAL | Status: DC

## 2015-06-06 ENCOUNTER — Other Ambulatory Visit: Payer: Self-pay

## 2015-06-06 MED ORDER — TEMAZEPAM 30 MG PO CAPS: ORAL_CAPSULE | ORAL | Status: DC

## 2015-06-18 ENCOUNTER — Other Ambulatory Visit: Payer: Self-pay

## 2015-06-18 ENCOUNTER — Encounter: Payer: Self-pay | Admitting: Physician Assistant

## 2015-06-18 ENCOUNTER — Ambulatory Visit (INDEPENDENT_AMBULATORY_CARE_PROVIDER_SITE_OTHER): Payer: 59 | Admitting: Physician Assistant

## 2015-06-18 VITALS — BP 130/76 | HR 94 | Temp 97.5°F | Ht 61.0 in | Wt 163.0 lb

## 2015-06-18 DIAGNOSIS — F4321 Adjustment disorder with depressed mood: Secondary | ICD-10-CM

## 2015-06-18 DIAGNOSIS — F329 Major depressive disorder, single episode, unspecified: Secondary | ICD-10-CM

## 2015-06-18 DIAGNOSIS — Z634 Disappearance and death of family member: Secondary | ICD-10-CM

## 2015-06-18 DIAGNOSIS — F4329 Adjustment disorder with other symptoms: Secondary | ICD-10-CM

## 2015-06-18 DIAGNOSIS — F32A Depression, unspecified: Secondary | ICD-10-CM

## 2015-06-18 MED ORDER — BUSPIRONE HCL 10 MG PO TABS: 10.0000 mg | ORAL_TABLET | Freq: Three times a day (TID) | ORAL | Status: DC | PRN

## 2015-06-18 MED ORDER — BUPROPION HCL ER (XL) 150 MG PO TB24: 150.0000 mg | ORAL_TABLET | ORAL | Status: DC

## 2015-06-18 NOTE — Progress Notes (Signed)
Assessment and Plan: 1. Depression Will call hospice for counseling.  - buPROPion (WELLBUTRIN XL) 150 MG 24 hr tablet; Take 1 tablet (150 mg total) by mouth every morning.  Dispense: 30 tablet; Refill: 2 - busPIRone (BUSPAR) 10 MG tablet; Take 1 tablet (10 mg total) by mouth 3 (three) times daily as needed (for anxiety).  Dispense: 90 tablet; Refill: 1  2. Complicated grief - busPIRone (BUSPAR) 10 MG tablet; Take 1 tablet (10 mg total) by mouth 3 (three) times daily as needed (for anxiety).  Dispense: 90 tablet; Refill: 1   HPI 55 y.o.female presents for depression/anxiety. Her mother passed Jan 6th from complications from a hernia repair, she is exexutor of her mothers estate and having a hard time working, grieving and being Therapist, sports. She was put on wellbutrin and xanax last visit and FMLA papers were filled out. She states she is still not handling her mothers death well, has not seen hospice but will schedule an appointment. She has not been taking phone calls or talking with people, trying to stay reclusive, did go out first time this Friday with her nephew from [place]. She is not sleeping well, sleeping 3 hours.   Past Medical History  Diagnosis Date  . Constipation, chronic   . PCOS (polycystic ovarian syndrome)     takes metformin for this  . Hypertension   . Fibroid uterus   . Diverticulitis   . Migraine   . History of gallstones   . Diverticulosis   . Type II or unspecified type diabetes mellitus without mention of complication, not stated as uncontrolled   . Thyroid disease   . Hypothyroidism   . Hyperlipidemia   . Arthritis   . Prediabetes      No Known Allergies    Current Outpatient Prescriptions on File Prior to Visit  Medication Sig Dispense Refill  . Aspirin (STANBACK HEADACHE POWDER PO) Take by mouth.    Marland Kitchen buPROPion (WELLBUTRIN XL) 150 MG 24 hr tablet Take 1 tablet (150 mg total) by mouth every morning. 30 tablet 2  . busPIRone (BUSPAR) 10 MG tablet Take 1  tablet (10 mg total) by mouth 3 (three) times daily. 30 tablet 0  . CALCIUM-VITAMIN D PO Take 1 tablet by mouth daily.    . canagliflozin (INVOKANA) 300 MG TABS tablet Take 300 mg by mouth daily. 30 tablet 3  . estradiol-norethindrone (ACTIVELLA) 1-0.5 MG per tablet Take 1 tablet by mouth  daily 84 tablet 3  . GARLIC PO Take 1 tablet by mouth 2 (two) times daily.      Marland Kitchen levothyroxine (SYNTHROID, LEVOTHROID) 100 MCG tablet Take 1 tablet (100 mcg total) by mouth daily before breakfast. 90 tablet 3  . losartan-hydrochlorothiazide (HYZAAR) 100-25 MG tablet Take 1 tablet by mouth  daily 90 tablet 2  . LYSINE PO Take 1 tablet by mouth at bedtime.      . metFORMIN (GLUCOPHAGE) 1000 MG tablet Take 1 tablet by mouth  twice a day for diabetes 180 tablet 3  . Multiple Vitamins-Minerals (MULTIVITAMIN WITH MINERALS) tablet Take 1 tablet by mouth daily.      . pantoprazole (PROTONIX) 40 MG tablet Take 1 tablet by mouth  every day 90 tablet 1  . scopolamine (TRANSDERM-SCOP, 1.5 MG,) 1 MG/3DAYS Place 1 patch (1.5 mg total) onto the skin every 3 (three) days. 4 patch 0  . temazepam (RESTORIL) 30 MG capsule TAKE 1 CAPSULE BY MOUTH ONCE DAILY AT BEDTIME 90 capsule 1  . valACYclovir (VALTREX) 500  MG tablet Take 1 tablet by mouth two  times daily as needed 180 tablet 0   No current facility-administered medications on file prior to visit.    ROS: all negative except above.   Physical Exam: Filed Weights   06/18/15 1054  Weight: 163 lb (73.936 kg)   BP 130/76 mmHg  Pulse 94  Temp(Src) 97.5 F (36.4 C)  Ht 5\' 1"  (1.549 m)  Wt 163 lb (73.936 kg)  BMI 30.81 kg/m2  SpO2 99% General Appearance: Well nourished, in no apparent distress. Eyes: PERRLA, EOMs, conjunctiva no swelling or erythema Sinuses: No Frontal/maxillary tenderness ENT/Mouth: Ext aud canals clear, TMs without erythema, bulging. No erythema, swelling, or exudate on post pharynx.  Tonsils not swollen or erythematous. Hearing normal.  Neck:  Supple, thyroid normal.  Respiratory: Respiratory effort normal, BS equal bilaterally without rales, rhonchi, wheezing or stridor.  Cardio: RRR with no MRGs. Brisk peripheral pulses without edema.  Abdomen: Soft, + BS.  Non tender, no guarding, rebound, hernias, masses. Lymphatics: Non tender without lymphadenopathy.  Musculoskeletal: Full ROM, 5/5 strength, normal gait.  Skin: Warm, dry without rashes, lesions, ecchymosis.  Neuro: Cranial nerves intact. Normal muscle tone, no cerebellar symptoms. Sensation intact.  Psych: Awake and oriented X 3, normal affect, tearful, Insight and Judgment appropriate.     Vicie Mutters, PA-C 11:07 AM [facility]

## 2015-06-18 NOTE — Patient Instructions (Signed)
[facility] Address: [address] [place], [place] [address]  Phone: [phone]   Complicated Grieving Grief is a normal response to the death of someone close to you. Feelings of fear, anger, and guilt can affect almost everyone who loses a loved one. It is also common to have symptoms of depression while you are grieving. These include problems with sleep, loss of appetite, and lack of energy. They may last for weeks or months after a loss. Complicated grief is different from normal grief or depression. Normal grieving involves sadness and feelings of loss, but these feelings are not constant. Complicated grief is a constant and severe type of grief. It interferes with your ability to function normally. It may last for several months to a year or longer. Complicated grief may require treatment from a mental health care provider. CAUSES  It is not known why some people continue to struggle with grief and others do not. You may be at higher risk for complicated grief if:  The death of your loved one was sudden or unexpected.  The death of your loved one was due to a violent event.  Your loved one committed suicide.  Your loved one was a child or a young person.  You were very close to or dependent on the loved one.  You have a history of depression. SIGNS AND SYMPTOMS Signs and symptoms of complicated grief may include:  Feeling disbelief or numbness.  Being unable to enjoy good memories of your loved one.  Needing to avoid anything that reminds you of your loved one.  Being unable to stop thinking about the death.  Feeling intense anger or guilt.  Feeling alone and hopeless.  Feeling that your life is meaningless and empty.  Losing the desire to live. DIAGNOSIS Your health care provider may diagnose complicated grief if:  You have constant symptoms of grief for 6-12 months or longer.  Your symptoms are interfering with your ability to live your life. Your  health care provider may want you to see a mental health care provider. Many symptoms of depression are similar to the symptoms of complicated grief. It is important to be evaluated for complicated grief along with other mental health conditions. TREATMENT  Talk therapy with a mental health provider is the most common treatment for complicated grief. During therapy, you will learn healthy ways to cope with the loss of your loved one. In some cases, your mental health care provider may also recommend antidepressant medicines. HOME CARE INSTRUCTIONS  Take care of yourself.  Eat regular meals and maintain a healthy diet. Eat plenty of fruits, vegetables, and whole grains.  Try to get some exercise each day.  Keep regular hours for sleep. Try to get at least 8 hours of sleep each night.  Do not use drugs or alcohol to ease your symptoms.  Take medicines only as directed by your health care provider.  Spend time with friends and loved ones.  Consider joining a grief (bereavement) support group to help you deal with your loss.  Keep all follow-up visits as directed by your health care provider. This is important. SEEK MEDICAL CARE IF:  Your symptoms keep you from functioning normally.  Your symptoms do not get better with treatment. SEEK IMMEDIATE MEDICAL CARE IF:  You have serious thoughts of hurting yourself or someone else.  You have suicidal feelings.   This information is not intended to replace advice given to you by your health care provider. Make sure  you discuss any questions you have with your health care provider.   Document Released: 04/14/2005 Document Revised: 01/03/2015 Document Reviewed: 09/22/2013 Elsevier Interactive Patient Education [employer].

## 2015-06-27 ENCOUNTER — Ambulatory Visit: Payer: Self-pay | Admitting: Physician Assistant

## 2015-07-09 ENCOUNTER — Other Ambulatory Visit: Payer: Self-pay | Admitting: Physician Assistant

## 2015-07-12 ENCOUNTER — Ambulatory Visit: Payer: Self-pay | Admitting: Physician Assistant

## 2015-07-16 ENCOUNTER — Encounter: Payer: Self-pay | Admitting: Physician Assistant

## 2015-07-16 ENCOUNTER — Ambulatory Visit: Payer: Self-pay | Admitting: Physician Assistant

## 2015-07-16 ENCOUNTER — Ambulatory Visit (INDEPENDENT_AMBULATORY_CARE_PROVIDER_SITE_OTHER): Payer: 59 | Admitting: Physician Assistant

## 2015-07-16 VITALS — BP 124/80 | HR 94 | Temp 97.3°F | Resp 16 | Ht 61.0 in | Wt 158.2 lb

## 2015-07-16 DIAGNOSIS — F4381 Prolonged grief disorder: Secondary | ICD-10-CM

## 2015-07-16 DIAGNOSIS — F4321 Adjustment disorder with depressed mood: Secondary | ICD-10-CM

## 2015-07-16 DIAGNOSIS — R7303 Prediabetes: Secondary | ICD-10-CM

## 2015-07-16 DIAGNOSIS — I10 Essential (primary) hypertension: Secondary | ICD-10-CM | POA: Diagnosis not present

## 2015-07-16 DIAGNOSIS — E039 Hypothyroidism, unspecified: Secondary | ICD-10-CM

## 2015-07-16 DIAGNOSIS — E785 Hyperlipidemia, unspecified: Secondary | ICD-10-CM

## 2015-07-16 DIAGNOSIS — F4329 Adjustment disorder with other symptoms: Secondary | ICD-10-CM

## 2015-07-16 DIAGNOSIS — Z634 Disappearance and death of family member: Secondary | ICD-10-CM

## 2015-07-16 LAB — TSH: TSH: 1.03 mIU/L

## 2015-07-16 LAB — CBC WITH DIFFERENTIAL/PLATELET
Basophils Absolute: 0.1 10*3/uL (ref 0.0–0.1)
Basophils Relative: 1 % (ref 0–1)
Eosinophils Absolute: 0.1 10*3/uL (ref 0.0–0.7)
Eosinophils Relative: 2 % (ref 0–5)
HEMATOCRIT: 45.5 % (ref 36.0–46.0)
HEMOGLOBIN: 14.8 g/dL (ref 12.0–15.0)
LYMPHS PCT: 25 % (ref 12–46)
Lymphs Abs: 1.8 10*3/uL (ref 0.7–4.0)
MCH: 29.5 pg (ref 26.0–34.0)
MCHC: 32.5 g/dL (ref 30.0–36.0)
MCV: 90.8 fL (ref 78.0–100.0)
MONO ABS: 0.6 10*3/uL (ref 0.1–1.0)
MONOS PCT: 8 % (ref 3–12)
MPV: 9.2 fL (ref 8.6–12.4)
NEUTROS ABS: 4.5 10*3/uL (ref 1.7–7.7)
Neutrophils Relative %: 64 % (ref 43–77)
Platelets: 343 10*3/uL (ref 150–400)
RBC: 5.01 MIL/uL (ref 3.87–5.11)
RDW: 14.1 % (ref 11.5–15.5)
WBC: 7 10*3/uL (ref 4.0–10.5)

## 2015-07-16 LAB — HEMOGLOBIN A1C
HEMOGLOBIN A1C: 5.2 % (ref <5.7)
MEAN PLASMA GLUCOSE: 103 mg/dL (ref <117)

## 2015-07-16 NOTE — Patient Instructions (Signed)

## 2015-07-16 NOTE — Progress Notes (Signed)
Assessment and Plan: 1. Essential hypertension - CBC with Differential/Platelet - BASIC METABOLIC PANEL WITH GFR - Hepatic function panel  2. Hyperlipidemia - Lipid panel  3. Hypothyroidism, unspecified hypothyroidism type - TSH  4. Prediabetes - Hemoglobin A1c  5. Hypomagnesemia - Magnesium  6. Complicated grief Continue wellbutrin/buspar, and follow up counseling here in [place], will be released back to work 08/03/2015 without restrictions.   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 She is still on wellbutrin and buspar PRN, she was seen counseling at hospice and states that helped considerably with grief. She is about to finish up with the estate and her [place], she has last court date for that on the 4th and wants to go back to work the 7th without restrictions.   Her blood pressure has been controlled at home, today their BP is BP: 124/80 mmHg  She does not workout. She denies chest pain, shortness of breath, dizziness.  She is on cholesterol medication and denies myalgias. Her cholesterol is at goal. The cholesterol last visit was:   Lab Results  Component Value Date   CHOL 176 02/27/2015   HDL 42* 02/27/2015   LDLCALC 110 02/27/2015   TRIG 118 02/27/2015   CHOLHDL 4.2 02/27/2015    She has been working on diet and exercise for diabetes but with diet and 1/2 invokana a day she is now in the non DM range, and denies paresthesia of the feet, polydipsia, polyuria and visual disturbances. Last A1C in the office was:  Lab Results  Component Value Date   HGBA1C 5.4 02/27/2015   Patient is on Vitamin D supplement.   Lab  Results  Component Value Date   VD25OH 71 02/27/2015      Past Medical History  Diagnosis Date  . Constipation, chronic   . PCOS (polycystic ovarian syndrome)     takes metformin for this  . Hypertension   . Fibroid uterus   . Diverticulitis   . Migraine   . History of gallstones   . Diverticulosis   . Type II or unspecified type diabetes mellitus without mention of complication, not stated as uncontrolled   . Thyroid disease   . Hypothyroidism   . Hyperlipidemia   . Arthritis   . Prediabetes      No Known Allergies    Current Outpatient Prescriptions on File Prior to Visit  Medication Sig Dispense Refill  . Aspirin (STANBACK HEADACHE POWDER PO) Take by mouth.    Marland Kitchen buPROPion (WELLBUTRIN XL) 150 MG 24 hr tablet Take 1 tablet (150 mg total) by mouth every morning. 30 tablet 2  . busPIRone (BUSPAR) 10 MG tablet Take 1 tablet (10 mg total) by mouth 3 (three) times daily as needed (for anxiety). 90 tablet 1  . CALCIUM-VITAMIN D PO Take 1 tablet by mouth daily.    Marland Kitchen estradiol-norethindrone (ACTIVELLA) 1-0.5 MG per tablet Take 1 tablet by mouth  daily 84 tablet 3  . GARLIC PO Take 1 tablet by mouth 2 (two) times daily.      . INVOKANA 300 MG TABS tablet TAKE 1 TABLET BY MOUTH EVERY DAY 30 tablet 1  . levothyroxine (  SYNTHROID, LEVOTHROID) 100 MCG tablet Take 1 tablet (100 mcg total) by mouth daily before breakfast. 90 tablet 3  . losartan-hydrochlorothiazide (HYZAAR) 100-25 MG tablet Take 1 tablet by mouth  daily 90 tablet 2  . LYSINE PO Take 1 tablet by mouth at bedtime.      . metFORMIN (GLUCOPHAGE) 1000 MG tablet Take 1 tablet by mouth  twice a day for diabetes 180 tablet 3  . Multiple Vitamins-Minerals (MULTIVITAMIN WITH MINERALS) tablet Take 1 tablet by mouth daily.      . pantoprazole (PROTONIX) 40 MG tablet Take 1 tablet by mouth  every day 90 tablet 1  . scopolamine (TRANSDERM-SCOP, 1.5 MG,) 1 MG/3DAYS Place 1 patch (1.5 mg total) onto the skin every 3 (three) days. 4 patch  0  . temazepam (RESTORIL) 30 MG capsule TAKE 1 CAPSULE BY MOUTH ONCE DAILY AT BEDTIME 90 capsule 1  . valACYclovir (VALTREX) 500 MG tablet Take 1 tablet by mouth two  times daily as needed 180 tablet 0   No current facility-administered medications on file prior to visit.    ROS: all negative except above.   Physical Exam: Filed Weights   07/16/15 0932  Weight: 158 lb 3.2 oz (71.759 kg)   BP 124/80 mmHg  Pulse 94  Temp(Src) 97.3 F (36.3 C) (Temporal)  Resp 16  Ht 5\' 1"  (1.549 m)  Wt 158 lb 3.2 oz (71.759 kg)  BMI 29.91 kg/m2  SpO2 99% General Appearance: Well nourished, in no apparent distress. Eyes: PERRLA, EOMs, conjunctiva no swelling or erythema Sinuses: No Frontal/maxillary tenderness ENT/Mouth: Ext aud canals clear, TMs without erythema, bulging. No erythema, swelling, or exudate on post pharynx.  Tonsils not swollen or erythematous. Hearing normal.  Neck: Supple, thyroid normal.  Respiratory: Respiratory effort normal, BS equal bilaterally without rales, rhonchi, wheezing or stridor.  Cardio: RRR with no MRGs. Brisk peripheral pulses without edema.  Abdomen: Soft, + BS.  Non tender, no guarding, rebound, hernias, masses. Lymphatics: Non tender without lymphadenopathy.  Musculoskeletal: Full ROM, 5/5 strength, normal gait.  Skin: Warm, dry without rashes, lesions, ecchymosis.  Neuro: Cranial nerves intact. Normal muscle tone, no cerebellar symptoms. Sensation intact.  Psych: Awake and oriented X 3, normal affect, tearful, Insight and Judgment appropriate.     Vicie Mutters, PA-C 9:51 AM [facility]

## 2015-07-17 LAB — LIPID PANEL
CHOL/HDL RATIO: 3.3 ratio (ref <=5.0)
CHOLESTEROL: 181 mg/dL (ref 125–200)
HDL: 55 mg/dL (ref >=46)
LDL Cholesterol: 114 mg/dL (ref <130)
Triglycerides: 61 mg/dL (ref <150)
VLDL: 12 mg/dL (ref <30)

## 2015-07-17 LAB — BASIC METABOLIC PANEL WITH GFR
BUN: 10 mg/dL (ref 7–25)
CALCIUM: 10.6 mg/dL — AB (ref 8.6–10.4)
CHLORIDE: 104 mmol/L (ref 98–110)
CO2: 28 mmol/L (ref 20–31)
CREATININE: 0.74 mg/dL (ref 0.50–1.05)
GFR, Est African American: >89 mL/min (ref >=60)
GLUCOSE: 101 mg/dL — AB (ref 65–99)
Potassium: 4.1 mmol/L (ref 3.5–5.3)
Sodium: 141 mmol/L (ref 135–146)

## 2015-07-17 LAB — HEPATIC FUNCTION PANEL
ALT: 13 U/L (ref 6–29)
AST: 12 U/L (ref 10–35)
Albumin: 4.2 g/dL (ref 3.6–5.1)
Alkaline Phosphatase: 56 U/L (ref 33–130)
BILIRUBIN INDIRECT: 0.2 mg/dL (ref 0.2–1.2)
Bilirubin, Direct: 0.1 mg/dL (ref <=0.2)
TOTAL PROTEIN: 6.7 g/dL (ref 6.1–8.1)
Total Bilirubin: 0.3 mg/dL (ref 0.2–1.2)

## 2015-07-17 LAB — MAGNESIUM: Magnesium: 2.1 mg/dL (ref 1.5–2.5)

## 2015-08-13 ENCOUNTER — Other Ambulatory Visit: Payer: Self-pay | Admitting: Physician Assistant

## 2015-08-21 ENCOUNTER — Other Ambulatory Visit: Payer: Self-pay | Admitting: Internal Medicine

## 2015-08-24 ENCOUNTER — Other Ambulatory Visit: Payer: Self-pay | Admitting: Internal Medicine

## 2015-09-12 ENCOUNTER — Other Ambulatory Visit: Payer: Self-pay | Admitting: Physician Assistant

## 2015-10-08 ENCOUNTER — Encounter: Payer: Self-pay | Admitting: Physician Assistant

## 2015-10-10 ENCOUNTER — Other Ambulatory Visit: Payer: Self-pay | Admitting: Physician Assistant

## 2015-11-02 ENCOUNTER — Other Ambulatory Visit: Payer: Self-pay | Admitting: Physician Assistant

## 2015-11-05 ENCOUNTER — Encounter: Payer: Self-pay | Admitting: Physician Assistant

## 2015-11-11 ENCOUNTER — Other Ambulatory Visit: Payer: Self-pay | Admitting: Physician Assistant

## 2015-12-13 ENCOUNTER — Ambulatory Visit (INDEPENDENT_AMBULATORY_CARE_PROVIDER_SITE_OTHER): Payer: 59 | Admitting: Internal Medicine

## 2015-12-13 ENCOUNTER — Encounter: Payer: Self-pay | Admitting: Internal Medicine

## 2015-12-13 ENCOUNTER — Other Ambulatory Visit: Payer: Self-pay | Admitting: Internal Medicine

## 2015-12-13 ENCOUNTER — Encounter: Payer: Self-pay | Admitting: Physician Assistant

## 2015-12-13 VITALS — BP 122/80 | HR 80 | Temp 97.7°F | Resp 16 | Ht 61.0 in | Wt 171.0 lb

## 2015-12-13 DIAGNOSIS — Z1212 Encounter for screening for malignant neoplasm of rectum: Secondary | ICD-10-CM

## 2015-12-13 DIAGNOSIS — Z136 Encounter for screening for cardiovascular disorders: Secondary | ICD-10-CM

## 2015-12-13 DIAGNOSIS — Z0001 Encounter for general adult medical examination with abnormal findings: Secondary | ICD-10-CM

## 2015-12-13 DIAGNOSIS — E559 Vitamin D deficiency, unspecified: Secondary | ICD-10-CM

## 2015-12-13 DIAGNOSIS — Z1159 Encounter for screening for other viral diseases: Secondary | ICD-10-CM

## 2015-12-13 DIAGNOSIS — Z0000 Encounter for general adult medical examination without abnormal findings: Secondary | ICD-10-CM | POA: Diagnosis not present

## 2015-12-13 DIAGNOSIS — E039 Hypothyroidism, unspecified: Secondary | ICD-10-CM

## 2015-12-13 DIAGNOSIS — E669 Obesity, unspecified: Secondary | ICD-10-CM

## 2015-12-13 DIAGNOSIS — E282 Polycystic ovarian syndrome: Secondary | ICD-10-CM

## 2015-12-13 DIAGNOSIS — I10 Essential (primary) hypertension: Secondary | ICD-10-CM | POA: Diagnosis not present

## 2015-12-13 DIAGNOSIS — Z130 Encounter for screening for diseases of the blood and blood-forming organs and certain disorders involving the immune mechanism: Secondary | ICD-10-CM

## 2015-12-13 DIAGNOSIS — Z79899 Other long term (current) drug therapy: Secondary | ICD-10-CM

## 2015-12-13 DIAGNOSIS — R7303 Prediabetes: Secondary | ICD-10-CM

## 2015-12-13 DIAGNOSIS — E785 Hyperlipidemia, unspecified: Secondary | ICD-10-CM

## 2015-12-13 LAB — CBC WITH DIFFERENTIAL/PLATELET
Basophils Absolute: 80 cells/uL (ref 0–200)
Basophils Relative: 1 %
EOS PCT: 2 %
Eosinophils Absolute: 160 cells/uL (ref 15–500)
HCT: 43.6 % (ref 35.0–45.0)
HEMOGLOBIN: 14 g/dL (ref 11.7–15.5)
LYMPHS ABS: 3040 {cells}/uL (ref 850–3900)
Lymphocytes Relative: 38 %
MCH: 28.7 pg (ref 27.0–33.0)
MCHC: 32.1 g/dL (ref 32.0–36.0)
MCV: 89.3 fL (ref 80.0–100.0)
MONO ABS: 560 {cells}/uL (ref 200–950)
MPV: 9 fL (ref 7.5–12.5)
Monocytes Relative: 7 %
NEUTROS PCT: 52 %
Neutro Abs: 4160 cells/uL (ref 1500–7800)
Platelets: 335 10*3/uL (ref 140–400)
RBC: 4.88 MIL/uL (ref 3.80–5.10)
RDW: 13.9 % (ref 11.0–15.0)
WBC: 8 10*3/uL (ref 3.8–10.8)

## 2015-12-13 LAB — VITAMIN B12: VITAMIN B 12: 1840 pg/mL — AB (ref 200–1100)

## 2015-12-13 LAB — HEMOGLOBIN A1C
Hgb A1c MFr Bld: 5.1 % (ref <5.7)
Mean Plasma Glucose: 100 mg/dL

## 2015-12-13 LAB — TSH: TSH: 1.15 mIU/L

## 2015-12-13 MED ORDER — TRIAMCINOLONE ACETONIDE 0.025 % EX OINT: 1.0000 "application " | TOPICAL_OINTMENT | Freq: Two times a day (BID) | CUTANEOUS | 0 refills | Status: DC

## 2015-12-13 MED ORDER — NALTREXONE-BUPROPION HCL ER 8-90 MG PO TB12: ORAL_TABLET | ORAL | 0 refills | Status: DC

## 2015-12-13 NOTE — Progress Notes (Signed)
Complete Physical  Assessment and Plan:   1. Encounter for general adult medical examination with abnormal findings  - CBC with Differential/Platelet - BASIC METABOLIC PANEL WITH GFR - Hepatic function panel - Magnesium  2. Essential hypertension -cont current meds - Urinalysis, Routine w reflex microscopic (not at [facility]) - Microalbumin / creatinine urine ratio - EKG 12-Lead - [place], RETROPERITNL ABD,  LTD  3. Hypothyroidism, unspecified hypothyroidism type -cont levothyroxine - TSH  4. Hyperlipidemia -cont diet and exercise -cont meds - Lipid panel  5. Prediabetes -cont metformin - Hemoglobin A1c - Insulin, random  6. Vitamin D deficiency -cont Vit D - VITAMIN D 25 Hydroxy (Vit-D Deficiency, Fractures)  7. Medication management   8. Obesity -cont diet and exercise -contrave  9. PCOS (polycystic ovarian syndrome) -followed by obgyn  10. Screening for deficiency anemia  - Iron and TIBC - Vitamin B12  11. Screening for rectal cancer  - POC Hemoccult Bld/Stl (3-Cd Home Screen); Future  12. Need for hepatitis C screening test  - Hepatitis C antibody   Discussed med's effects and SE's. Screening labs and tests as requested with regular follow-up as recommended.  HPI  55 y.o. female  presents for a complete physical.  Her blood pressure has been controlled at home, today their BP is BP: 122/80.  She does not workout. She denies chest pain, shortness of breath, dizziness.  She reports that she is not working out right now because they closed her gym.  She reports that she is doing a lot of eating out of habit.    She is on cholesterol medication and denies myalgias. Her cholesterol is at goal. The cholesterol last visit was:  Lab Results  Component Value Date   CHOL 181 07/16/2015   HDL 55 07/16/2015   LDLCALC 114 07/16/2015   TRIG 61 07/16/2015   CHOLHDL 3.3 07/16/2015  .  She has been working on diet and exercise for prediabetes, she is on bASA,  she is on ACE/ARB and denies foot ulcerations, hyperglycemia, hypoglycemia , increased appetite, nausea, paresthesia of the feet, polydipsia, polyuria, visual disturbances, vomiting and weight loss. Last A1C in the office was:  Lab Results  Component Value Date   HGBA1C 5.2 07/16/2015    Patient is on Vitamin D supplement.   Lab Results  Component Value Date   VD25OH 42 02/27/2015     She reports that she is doing weight watchers currently.  She reports that she is not doing this right now.    Current Medications:  Current Outpatient Prescriptions on File Prior to Visit  Medication Sig Dispense Refill  . AMABELZ 1-0.5 MG tablet Take 1 tablet by mouth  daily 84 tablet 1  . Aspirin (STANBACK HEADACHE POWDER PO) Take by mouth.    Marland Kitchen CALCIUM-VITAMIN D PO Take 1 tablet by mouth daily.    . fluconazole (DIFLUCAN) 150 MG tablet TAKE 1 TABLET(150 MG) BY MOUTH DAILY 1 tablet 0  . GARLIC PO Take 1 tablet by mouth 2 (two) times daily.      . INVOKANA 300 MG TABS tablet TAKE 1 TABLET BY MOUTH EVERY DAY 30 tablet 1  . levothyroxine (SYNTHROID, LEVOTHROID) 100 MCG tablet Take 1 tablet on an empty stomach for 30 minutes every morning 90 tablet 1  . losartan-hydrochlorothiazide (HYZAAR) 100-25 MG tablet Take 1 tablet by mouth  daily 90 tablet 2  . LYSINE PO Take 1 tablet by mouth at bedtime.      . metFORMIN (GLUCOPHAGE) 1000 MG  tablet Take 1 tablet by mouth  twice a day for diabetes 180 tablet 1  . Multiple Vitamins-Minerals (MULTIVITAMIN WITH MINERALS) tablet Take 1 tablet by mouth daily.      . pantoprazole (PROTONIX) 40 MG tablet Take 1 tablet by mouth  every day 90 tablet 1  . scopolamine (TRANSDERM-SCOP, 1.5 MG,) 1 MG/3DAYS Place 1 patch (1.5 mg total) onto the skin every 3 (three) days. 4 patch 0  . temazepam (RESTORIL) 30 MG capsule TAKE 1 CAPSULE BY MOUTH ONCE DAILY AT BEDTIME 90 capsule 1  . valACYclovir (VALTREX) 500 MG tablet Take 1 tablet by mouth two  times daily as needed 180 tablet 1    No current facility-administered medications on file prior to visit.     Health Maintenance:   Immunization History  Administered Date(s) Administered  . Influenza-Unspecified 02/26/2015  . Tdap 07/08/2011  . Zoster 03/21/2014    Tetanus: 2013 Flu vaccine: 2016 Zostavax: 2015 Pap: 2016 MGM:12/16  DEXA: 2013 Colonoscopy: 2012 Last Eye Exam:  Dr. Gershon Crane 2017  Patient Care Team: Unk Pinto, MD as PCP - General (Internal Medicine) Lafayette Dragon, MD as Consulting Physician (Gastroenterology) Servando Salina, MD as Consulting Physician (Obstetrics and Gynecology)  Allergies: No Known Allergies  Medical History:  Past Medical History:  Diagnosis Date  . Arthritis   . Constipation, chronic   . Diverticulitis   . Diverticulosis   . Fibroid uterus   . History of gallstones   . Hyperlipidemia   . Hypertension   . Hypothyroidism   . Migraine   . PCOS (polycystic ovarian syndrome)    takes metformin for this  . Prediabetes   . Thyroid disease   . Type II or unspecified type diabetes mellitus without mention of complication, not stated as uncontrolled     Surgical History:  Past Surgical History:  Procedure Laterality Date  . BREAST REDUCTION SURGERY  1988  . BREAST SURGERY    . CHOLECYSTECTOMY  1995  . EYE SURGERY    . LASIK Left   . REFRACTIVE SURGERY Left   . TONSILLECTOMY AND ADENOIDECTOMY  1990's    Family History:  Family History  Problem Relation Age of Onset  . Colon cancer Neg Hx   . Diabetes Mother   . Lung cancer Mother   . COPD Mother   . Diverticulitis Mother   . Leukemia Mother   . Heart disease Mother   . Diabetes Sister     Social History:  Social History  Substance Use Topics  . Smoking status: Never Smoker  . Smokeless tobacco: Never Used  . Alcohol use Yes     Comment: rarely    Review of Systems: Review of Systems  Constitutional: Negative for chills, fever and malaise/fatigue.  HENT: Negative for congestion, ear  pain and sore throat.   Eyes: Negative.   Respiratory: Negative for cough, shortness of breath and wheezing.   Cardiovascular: Negative for chest pain, palpitations and leg swelling.  Gastrointestinal: Negative for abdominal pain, blood in stool, constipation, diarrhea, heartburn and melena.  Genitourinary: Negative.   Skin: Negative.   Neurological: Negative for dizziness, sensory change, loss of consciousness and headaches.  Psychiatric/Behavioral: Negative for depression. The patient is not nervous/anxious and does not have insomnia.     Physical Exam: Estimated body mass index is 32.31 kg/m as calculated from the following:   Height as of this encounter: 5\' 1"  (1.549 m).   Weight as of this encounter: 171 lb (77.6 kg). BP 122/80  Pulse 80   Temp 97.7 F (36.5 C)   Resp 16   Ht 5\' 1"  (1.549 m)   Wt 171 lb (77.6 kg)   SpO2 98%   BMI 32.31 kg/m   General Appearance: Well nourished well developed, in no apparent distress.  Eyes: PERRLA, EOMs, conjunctiva no swelling or erythema ENT/Mouth: Ear canals normal without obstruction, swelling, erythema, or discharge.  TMs normal bilaterally with no erythema, bulging, retraction, or loss of landmark.  Oropharynx moist and clear with no exudate, erythema, or swelling.   Neck: Supple, thyroid normal. No bruits.  No cervical adenopathy Respiratory: Respiratory effort normal, Breath sounds clear A&P without wheeze, rhonchi, rales.   Cardio: RRR without murmurs, rubs or gallops. Brisk peripheral pulses without edema.  Chest: symmetric, with normal excursions Breasts: Deferred to obgyn Abdomen: Soft, nontender, no guarding, rebound, hernias, masses, or organomegaly.  Lymphatics: Non tender without lymphadenopathy.  Musculoskeletal: Full ROM all peripheral extremities,5/5 strength, and normal gait.  Skin: Warm, dry without rashes, lesions, ecchymosis. Neuro: Awake and oriented X 3, Cranial nerves intact, reflexes equal bilaterally. Normal  muscle tone, no cerebellar symptoms. Sensation intact.  Psych:  normal affect, Insight and Judgment appropriate.   EKG: WNL no changes.  Over 40 minutes of exam, counseling, chart review and critical decision making was performed  Loma Sousa Forcucci 2:05 PM [facility]

## 2015-12-13 NOTE — Patient Instructions (Signed)
Bupropion; Naltrexone extended-release tablets What is this medicine? BUPROPION; NALTREXONE (byoo PROE pee on; nal TREX one) is a combination product used to promote and maintain weight loss in obese adults or overweight adults who also have weight related medical problems. This medicine should be used with a reduced calorie diet and increased physical activity. This medicine may be used for other purposes; ask your health care provider or pharmacist if you have questions. What should I tell my health care provider before I take this medicine? They need to know if you have any of these conditions: -an eating disorder, such as anorexia or bulimia -diabetes -glaucoma -head injury -heart disease -high blood pressure -history of a drug or alcohol abuse problem -history of a tumor or infection of your brain or spine -history of stroke -history of irregular heartbeat -kidney disease -liver disease -mental illness such as bipolar disorder or psychosis -seizures -suicidal thoughts, plans, or attempt; a previous suicide attempt by you or a family member -an unusual or allergic reaction to bupropion, naltrexone, other medicines, foods, dyes, or preservatives breast-feeding -pregnant or trying to become pregnant How should I use this medicine? Take this medicine by mouth with a glass of water. Follow the directions on the prescription label. Take this medicine in the morning and in the evenings as directed by your healthcare professional. Dennis Bast can take it with or without food. Do not take with high-fat meals as this may increase your risk of seizures. Do not crush, chew, or cut these tablets. Do not take your medicine more often than directed. Do not stop taking this medicine suddenly except upon the advice of your doctor. A special MedGuide will be given to you by the pharmacist with each prescription and refill. Be sure to read this information carefully each time. Talk to your pediatrician  regarding the use of this medicine in children. Special care may be needed. Overdosage: If you think you have taken too much of this medicine contact a poison control center or emergency room at once. NOTE: This medicine is only for you. Do not share this medicine with others. What if I miss a dose? If you miss a dose, skip the missed dose and take your next tablet at the regular time. Do not take double or extra doses. What may interact with this medicine? Do not take this medicine with any of the following medications: -any prescription or street opioid drug like codiene, heroin, methadone -linezolid -MAOIs like Carbex, Eldepryl, Marplan, Nardil, and Parnate -methylene blue (injected into a vein) -other medicines that contain bupropion like Zyban or Wellbutrin This medicine may also interact with the following medications: -alcohol -certain medicines for anxiety or sleep -certain medicines for blood pressure like metoprolol, propranolol -certain medicines for depression or psychotic disturbances -certain medicines for HIV or AIDS like efavirenz, lopinavir, nelfinavir, ritonavir -certain medicines for irregular heart beat like propafenone, flecainide -certain medicines for Parkinson's disease like amantadine, levodopa -certain medicines for seizures like carbamazepine, phenytoin, phenobarbital -cimetidine -clopidogrel -cyclophosphamide -disulfiram -furazolidone -isoniazid -nicotine -orphenadrine -procarbazine -steroid medicines like prednisone or cortisone -stimulant medicines for attention disorders, weight loss, or to stay awake -tamoxifen -theophylline -thioridazine -thiotepa -ticlopidine -tramadol -warfarin This list may not describe all possible interactions. Give your health care provider a list of all the medicines, herbs, non-prescription drugs, or dietary supplements you use. Also tell them if you smoke, drink alcohol, or use illegal drugs. Some items may interact  with your medicine. What should I watch for while using this medicine? This  medicine is intended to be used in addition to a healthy diet and appropriate exercise. The best results are achieved this way. Do not increase or in any way change your dose without consulting your doctor or health care professional. Do not take this medicine with other prescription or over-the-counter weight loss products without consulting your doctor or health care professional. Your doctor should tell you to stop taking this medicine if you do not lose a certain amount of weight within the first 12 weeks of treatment. Visit your doctor or health care professional for regular checkups. Your doctor may order blood tests or other tests to see how you are doing. This medicine may affect blood sugar levels. If you have diabetes, check with your doctor or health care professional before you change your diet or the dose of your diabetic medicine. Patients and their families should watch out for new or worsening depression or thoughts of suicide. Also watch out for sudden changes in feelings such as feeling anxious, agitated, panicky, irritable, hostile, aggressive, impulsive, severely restless, overly excited and hyperactive, or not being able to sleep. If this happens, especially at the beginning of treatment or after a change in dose, call your health care professional. Avoid alcoholic drinks while taking this medicine. Drinking large amounts of alcoholic beverages, using sleeping or anxiety medicines, or quickly stopping the use of these agents while taking this medicine may increase your risk for a seizure. What side effects may I notice from receiving this medicine? Side effects that you should report to your doctor or health care professional as soon as possible: -allergic reactions like skin rash, itching or hives, swelling of the face, lips, or tongue -breathing problems -changes in vision, hearing -chest  pain -confusion -dark urine -depressed mood -fast or irregular heart beat -fever -hallucination, loss of contact with reality -increased blood pressure -light-colored stools -redness, blistering, peeling or loosening of the skin, including inside the mouth -right upper belly pain -seizures -suicidal thoughts or other mood changes -unusually weak or tired -vomiting -yellowing of the eyes or skin Side effects that usually do not require medical attention (Report these to your doctor or health care professional if they continue or are bothersome.): -constipation -diarrhea -dizziness -dry mouth -headache -nausea -trouble sleeping This list may not describe all possible side effects. Call your doctor for medical advice about side effects. You may report side effects to FDA at 1-800-FDA-1088. Where should I keep my medicine? Keep out of the reach of children. Store at room temperature between 15 and 30 degrees C (59 and 86 degrees F). Throw away any unused medicine after the expiration date. NOTE: This sheet is a summary. It may not cover all possible information. If you have questions about this medicine, talk to your doctor, pharmacist, or health care provider.    2016, [employer]vier/[employer]. (2013-01-19 15:17:29)

## 2015-12-14 LAB — LIPID PANEL
CHOL/HDL RATIO: 3.2 ratio (ref <=5.0)
CHOLESTEROL: 189 mg/dL (ref 125–200)
HDL: 59 mg/dL (ref >=46)
LDL Cholesterol: 111 mg/dL (ref <130)
TRIGLYCERIDES: 97 mg/dL (ref <150)
VLDL: 19 mg/dL (ref <30)

## 2015-12-14 LAB — BASIC METABOLIC PANEL WITH GFR
BUN: 16 mg/dL (ref 7–25)
CALCIUM: 9.8 mg/dL (ref 8.6–10.4)
CO2: 22 mmol/L (ref 20–31)
Chloride: 104 mmol/L (ref 98–110)
Creat: 0.67 mg/dL (ref 0.50–1.05)
Glucose, Bld: 73 mg/dL (ref 65–99)
Potassium: 4.3 mmol/L (ref 3.5–5.3)
SODIUM: 139 mmol/L (ref 135–146)

## 2015-12-14 LAB — HEPATIC FUNCTION PANEL
ALT: 14 U/L (ref 6–29)
AST: 13 U/L (ref 10–35)
Albumin: 4.1 g/dL (ref 3.6–5.1)
Alkaline Phosphatase: 48 U/L (ref 33–130)
BILIRUBIN DIRECT: 0.1 mg/dL (ref <=0.2)
BILIRUBIN INDIRECT: 0.3 mg/dL (ref 0.2–1.2)
Total Bilirubin: 0.4 mg/dL (ref 0.2–1.2)
Total Protein: 6.9 g/dL (ref 6.1–8.1)

## 2015-12-14 LAB — URINALYSIS, ROUTINE W REFLEX MICROSCOPIC
Bilirubin Urine: NEGATIVE
HGB URINE DIPSTICK: NEGATIVE
KETONES UR: NEGATIVE
Leukocytes, UA: NEGATIVE
NITRITE: NEGATIVE
Protein, ur: NEGATIVE
Specific Gravity, Urine: 1.005 (ref 1.001–1.035)
pH: 7.5 (ref 5.0–8.0)

## 2015-12-14 LAB — MICROALBUMIN / CREATININE URINE RATIO: CREATININE, URINE: 10 mg/dL — AB (ref 20–320)

## 2015-12-14 LAB — MAGNESIUM: Magnesium: 2 mg/dL (ref 1.5–2.5)

## 2015-12-14 LAB — VITAMIN D 25 HYDROXY (VIT D DEFICIENCY, FRACTURES): Vit D, 25-Hydroxy: 84 ng/mL (ref 30–100)

## 2015-12-14 LAB — INSULIN, RANDOM: Insulin: 10.3 u[IU]/mL (ref 2.0–19.6)

## 2015-12-14 LAB — IRON AND TIBC
%SAT: 19 % (ref 11–50)
Iron: 83 ug/dL (ref 45–160)
TIBC: 443 ug/dL (ref 250–450)
UIBC: 360 ug/dL (ref 125–400)

## 2015-12-18 LAB — HEPATITIS C ANTIBODY: HCV Ab: NEGATIVE

## 2016-01-06 ENCOUNTER — Other Ambulatory Visit: Payer: Self-pay | Admitting: Internal Medicine

## 2016-01-06 ENCOUNTER — Other Ambulatory Visit: Payer: Self-pay | Admitting: Physician Assistant

## 2016-01-08 ENCOUNTER — Other Ambulatory Visit: Payer: Self-pay | Admitting: Physician Assistant

## 2016-01-08 ENCOUNTER — Other Ambulatory Visit: Payer: Self-pay | Admitting: *Deleted

## 2016-01-08 MED ORDER — TEMAZEPAM 30 MG PO CAPS: ORAL_CAPSULE | ORAL | 1 refills | Status: DC

## 2016-01-09 ENCOUNTER — Other Ambulatory Visit: Payer: Self-pay | Admitting: Internal Medicine

## 2016-02-14 ENCOUNTER — Ambulatory Visit (INDEPENDENT_AMBULATORY_CARE_PROVIDER_SITE_OTHER): Payer: 59 | Admitting: Internal Medicine

## 2016-02-14 ENCOUNTER — Ambulatory Visit (HOSPITAL_COMMUNITY)
Admission: RE | Admit: 2016-02-14 | Discharge: 2016-02-14 | Disposition: A | Discharge status: Home / Self Care | Payer: 59 | Source: Ambulatory Visit | Attending: Internal Medicine | Admitting: Internal Medicine

## 2016-02-14 ENCOUNTER — Encounter: Payer: Self-pay | Admitting: Internal Medicine

## 2016-02-14 VITALS — BP 136/70 | HR 88 | Temp 98.2°F | Resp 16 | Ht 61.0 in

## 2016-02-14 DIAGNOSIS — R079 Chest pain, unspecified: Secondary | ICD-10-CM | POA: Diagnosis present

## 2016-02-14 DIAGNOSIS — R0602 Shortness of breath: Secondary | ICD-10-CM | POA: Diagnosis not present

## 2016-02-14 DIAGNOSIS — R05 Cough: Secondary | ICD-10-CM | POA: Insufficient documentation

## 2016-02-14 LAB — CBC WITH DIFFERENTIAL/PLATELET
Basophils Absolute: 0 cells/uL (ref 0–200)
Basophils Relative: 0 %
EOS ABS: 164 {cells}/uL (ref 15–500)
Eosinophils Relative: 2 %
HCT: 43.2 % (ref 35.0–45.0)
Hemoglobin: 13.9 g/dL (ref 11.7–15.5)
LYMPHS PCT: 31 %
Lymphs Abs: 2542 cells/uL (ref 850–3900)
MCH: 29.4 pg (ref 27.0–33.0)
MCHC: 32.2 g/dL (ref 32.0–36.0)
MCV: 91.5 fL (ref 80.0–100.0)
MONOS PCT: 6 %
MPV: 9.4 fL (ref 7.5–12.5)
Monocytes Absolute: 492 cells/uL (ref 200–950)
NEUTROS PCT: 61 %
Neutro Abs: 5002 cells/uL (ref 1500–7800)
PLATELETS: 330 10*3/uL (ref 140–400)
RBC: 4.72 MIL/uL (ref 3.80–5.10)
RDW: 14.4 % (ref 11.0–15.0)
WBC: 8.2 10*3/uL (ref 3.8–10.8)

## 2016-02-14 LAB — COMPLETE METABOLIC PANEL WITH GFR
ALT: 15 U/L (ref 6–29)
AST: 13 U/L (ref 10–35)
Albumin: 4 g/dL (ref 3.6–5.1)
Alkaline Phosphatase: 52 U/L (ref 33–130)
BILIRUBIN TOTAL: 0.3 mg/dL (ref 0.2–1.2)
BUN: 14 mg/dL (ref 7–25)
CALCIUM: 9.7 mg/dL (ref 8.6–10.4)
CO2: 26 mmol/L (ref 20–31)
CREATININE: 0.65 mg/dL (ref 0.50–1.05)
Chloride: 104 mmol/L (ref 98–110)
GFR, Est Non African American: >89 mL/min (ref >=60)
Glucose, Bld: 98 mg/dL (ref 65–99)
Potassium: 4 mmol/L (ref 3.5–5.3)
Sodium: 139 mmol/L (ref 135–146)
TOTAL PROTEIN: 6.8 g/dL (ref 6.1–8.1)

## 2016-02-14 LAB — D-DIMER, QUANTITATIVE (NOT AT ARMC)

## 2016-02-14 MED ORDER — FLUCONAZOLE 150 MG PO TABS: 150.0000 mg | ORAL_TABLET | Freq: Once | ORAL | 5 refills | Status: AC

## 2016-02-14 MED ORDER — IPRATROPIUM-ALBUTEROL 0.5-2.5 (3) MG/3ML IN SOLN
3.0000 mL | Freq: Once | RESPIRATORY_TRACT | Status: AC
  Administered 2016-02-14: 3 mL via RESPIRATORY_TRACT

## 2016-02-14 MED ORDER — ALBUTEROL SULFATE HFA 108 (90 BASE) MCG/ACT IN AERS: 2.0000 | INHALATION_SPRAY | Freq: Four times a day (QID) | RESPIRATORY_TRACT | 0 refills | Status: DC | PRN

## 2016-02-14 MED ORDER — AZITHROMYCIN 250 MG PO TABS: ORAL_TABLET | ORAL | 0 refills | Status: DC

## 2016-02-14 MED ORDER — PREDNISONE 20 MG PO TABS: ORAL_TABLET | ORAL | 0 refills | Status: DC

## 2016-02-14 NOTE — Progress Notes (Signed)
HPI  Patient presents to the office for evaluation of cough.  It has been going on for 2 weeks.  Patient reports dry, barky, worse with exercise, worse with lying down.  They also endorse change in voice, shortness of breath, wheezing and mild throat soreness.  .  They have tried mucinex, benadryl, zyrtec .  They report that nothing has worked.  They admits to other sick contacts.  Her friend who went to [place] with her was also having upper respiratory symptoms.  She reports that she feels like she did when she had bronchitis in the past.    Review of Systems  Constitutional: Positive for malaise/fatigue. Negative for chills and fever.  HENT: Positive for congestion, ear pain, hearing loss and sore throat.   Respiratory: Positive for cough. Negative for hemoptysis, sputum production, shortness of breath and wheezing.   Cardiovascular: Negative for chest pain, palpitations, orthopnea, leg swelling and PND.  Neurological: Positive for headaches.    PE:  Vitals:   02/14/16 0938  BP: 136/70  Pulse: 88  Resp: 16  Temp: 98.2 F (36.8 C)    General:  Alert and non-toxic, WDWN, NAD HEENT: NCAT, PERLA, EOM normal, no occular discharge or erythema.  Nasal mucosal edema with sinus tenderness to palpation.  Oropharynx clear with minimal oropharyngeal edema and erythema.  Mucous membranes moist and pink. Neck:  Cervical adenopathy Chest:  RRR no MRGs.  Lungs clear to auscultation A&P with no wheezes rhonchi or rales.   Abdomen: +BS x 4 quadrants, soft, non-tender, no guarding, rigidity, or rebound. Skin: warm and dry no rash Neuro: A&Ox4, CN II-XII grossly intact  Assessment and Plan:   1. Shortness of breath -infection but given recent travel will get CXR and also a D-dimer to rule out possible PE.   -zpak -albuterol -prednisone - D-dimer, quantitative (not at [facility]) - COMPLETE METABOLIC PANEL WITH GFR - CBC with Differential/Platelet - Brain natriuretic peptide [id number]) - DG Chest 2  View; Future

## 2016-02-15 LAB — BRAIN NATRIURETIC PEPTIDE: Brain Natriuretic Peptide: 6.7 pg/mL (ref <100)

## 2016-02-19 ENCOUNTER — Encounter: Payer: Self-pay | Admitting: Internal Medicine

## 2016-02-27 ENCOUNTER — Other Ambulatory Visit: Payer: Self-pay | Admitting: Internal Medicine

## 2016-03-03 ENCOUNTER — Other Ambulatory Visit: Payer: Self-pay | Admitting: Physician Assistant

## 2016-03-18 ENCOUNTER — Encounter: Payer: Self-pay | Admitting: Internal Medicine

## 2016-03-18 ENCOUNTER — Ambulatory Visit (INDEPENDENT_AMBULATORY_CARE_PROVIDER_SITE_OTHER): Payer: 59 | Admitting: Internal Medicine

## 2016-03-18 VITALS — BP 128/80 | HR 98 | Temp 97.8°F | Resp 16 | Ht 61.0 in | Wt 176.0 lb

## 2016-03-18 DIAGNOSIS — M79645 Pain in left finger(s): Secondary | ICD-10-CM | POA: Diagnosis not present

## 2016-03-18 DIAGNOSIS — R7303 Prediabetes: Secondary | ICD-10-CM | POA: Diagnosis not present

## 2016-03-18 DIAGNOSIS — E039 Hypothyroidism, unspecified: Secondary | ICD-10-CM | POA: Diagnosis not present

## 2016-03-18 DIAGNOSIS — Z79899 Other long term (current) drug therapy: Secondary | ICD-10-CM

## 2016-03-18 DIAGNOSIS — E782 Mixed hyperlipidemia: Secondary | ICD-10-CM

## 2016-03-18 DIAGNOSIS — E559 Vitamin D deficiency, unspecified: Secondary | ICD-10-CM

## 2016-03-18 DIAGNOSIS — I10 Essential (primary) hypertension: Secondary | ICD-10-CM | POA: Diagnosis not present

## 2016-03-18 DIAGNOSIS — Z23 Encounter for immunization: Secondary | ICD-10-CM | POA: Diagnosis not present

## 2016-03-18 MED ORDER — MELOXICAM 15 MG PO TABS: 15.0000 mg | ORAL_TABLET | Freq: Every day | ORAL | 1 refills | Status: DC

## 2016-03-18 NOTE — Patient Instructions (Signed)
Please wear your thumb spica splint for 2 weeks daily.  Take it off to sleep and only to wash your hands.      De Quervain Tenosynovitis Introduction Tendons attach muscles to bones. They also help with joint movements. When tendons become irritated or swollen, it is called tendinitis. The extensor pollicis brevis (EPB) tendon connects the EPB muscle to a bone that is near the base of the thumb. The EPB muscle helps to straighten and extend the thumb. De Quervain tenosynovitis is a condition in which the EPB tendon lining (sheath) becomes irritated, thickened, and swollen. This condition is sometimes called stenosing tenosynovitis. This condition causes pain on the thumb side of the back of the wrist. What are the causes? Causes of this condition include:  Activities that repeatedly cause your thumb and wrist to extend.  A sudden increase in activity or change in activity that affects your wrist. What increases the risk? This condition is more likely to develop in:  Females.  People who have diabetes.  Women who have recently given birth.  People who are over 55 years of age.  People who do activities that involve repeated hand and wrist motions, such as tennis, racquetball, volleyball, gardening, and taking care of children.  People who do heavy labor.  People who have poor wrist strength and flexibility.  People who do not warm up properly before activities. What are the signs or symptoms? Symptoms of this condition include:  Pain or tenderness over the thumb side of the back of the wrist when your thumb and wrist are not moving.  Pain that gets worse when you straighten your thumb or extend your thumb or wrist.  Pain when the injured area is touched.  Locking or catching of the thumb joint while you bend and straighten your thumb.  Decreased thumb motion due to pain.  Swelling over the affected area. How is this diagnosed? This condition is diagnosed with a medical  history and physical exam. Your health care provider will ask for details about your injury and ask about your symptoms. How is this treated? Treatment may include the use of icing and medicines to reduce pain and swelling. You may also be advised to wear a splint or brace to limit your thumb and wrist motion. In less severe cases, treatment may also include working with a physical therapist to strengthen your wrist and calm the irritation around your EPB tendon sheath. In severe cases, surgery may be needed. Follow these instructions at home: If you have a splint or brace:  Wear it as told by your health care provider. Remove it only as told by your health care provider.  Loosen the splint or brace if your fingers become numb and tingle, or if they turn cold and blue.  Keep the splint or brace clean and dry. Managing pain, stiffness, and swelling  If directed, apply ice to the injured area.  Put ice in a plastic bag.  Place a towel between your skin and the bag.  Leave the ice on for 20 minutes, 2-3 times per day.  Move your fingers often to avoid stiffness and to lessen swelling.  Raise (elevate) the injured area above the level of your heart while you are sitting or lying down. General instructions  Return to your normal activities as told by your health care provider. Ask your health care provider what activities are safe for you.  Take over-the-counter and prescription medicines only as told by your health care provider.  Keep all follow-up visits as told by your health care provider. This is important.  Do not drive or operate heavy machinery while taking prescription pain medicine. Contact a health care provider if:  Your pain, tenderness, or swelling gets worse, even if you have had treatment.  You have numbness or tingling in your wrist, hand, or fingers on the injured side. This information is not intended to replace advice given to you by your health care provider.  Make sure you discuss any questions you have with your health care provider. Document Released: 04/14/2005 Document Revised: 09/20/2015 Document Reviewed: 06/20/2014  2017 Elsevier

## 2016-03-18 NOTE — Progress Notes (Signed)
Assessment and Plan:  Hypertension:  -Continue medication,  -monitor blood pressure at home.  -Continue DASH diet.   -Reminder to go to the ER if any CP, SOB, nausea, dizziness, severe HA, changes vision/speech, left arm numbness and tingling, and jaw pain.  Cholesterol: -Continue diet and exercise.  -Check cholesterol.   Pre-diabetes: -Continue diet and exercise.  -Check A1C  Vitamin D Def: -check level -continue medications.   Thumb pain -de quervains seconday to heavy lifting of bags -meloxicam daily -thumb spica x 2 weeks  Depressions -secondary to the loss of her mother -really having a hard time with the holidays coming -recommended continuing her counseling with hospice -challenged to start going out once per week for a small group meeting at [place] and meds as discussed. Further disposition pending results of labs.  HPI 55 y.o. female  presents for 3 month follow up with hypertension, hyperlipidemia, prediabetes and vitamin D.   Her blood pressure has been controlled at home, today their BP is BP: 128/80.   She does workout. She denies chest pain, shortness of breath, dizziness.  She is doing zumba.     She is on cholesterol medication and denies myalgias. Her cholesterol is at goal. The cholesterol last visit was:   Lab Results  Component Value Date   CHOL 189 12/13/2015   HDL 59 12/13/2015   LDLCALC 111 12/13/2015   TRIG 97 12/13/2015   CHOLHDL 3.2 12/13/2015     She has been working on diet and exercise for prediabetes, and denies foot ulcerations, hyperglycemia, hypoglycemia , increased appetite, nausea, paresthesia of the feet, polydipsia, polyuria, visual disturbances, vomiting and weight loss. Last A1C in the office was:  Lab Results  Component Value Date   HGBA1C 5.1 12/13/2015    Patient is on Vitamin D supplement.  Lab Results  Component Value Date   VD25OH 84 12/13/2015     Patient reports that for the past 2 weeks she has  developed some pain in her left thumb.  She reports that the pain is nearly constant in the left hand and is a throbbing aching feeling.  She has a lot of pain when she grabs things with that hand.  She reports that she cannot open jars and bottles due to pain.  She reports that she has pain when she knocks it.  She notes that this has gradually happened. NO acute injury.  She reports that she does carry some heavy bags with that side.  She is right hand dominant.  She reports that she writes with her right hand and also eats with her right hand. She has not had any issues with this is the past.  She has never seen an orthopedic doctor in this area before.  She is still having some 2-3 seconds that she experiences on the left side of her chest.  She notes that there is no aggravating factors.  IT happens at rest and with activity.  Its enough that it comes to her attention, but not so severe that she is debilitated.  She reports that she did try to take some asa but that didn't help.  She is under a lot of stress because this is her first holiday without her mother.    Current Medications:  Current Outpatient Prescriptions on File Prior to Visit  Medication Sig Dispense Refill  . AMABELZ 1-0.5 MG tablet TAKE 1 TABLET BY MOUTH  DAILY 84 tablet 0  . Aspirin (STANBACK HEADACHE POWDER PO) Take  by mouth.    Marland Kitchen CALCIUM-VITAMIN D PO Take 1 tablet by mouth daily.    . fluconazole (DIFLUCAN) 150 MG tablet TAKE 1 TABLET(150 MG) BY MOUTH DAILY 1 tablet 0  . GARLIC PO Take 1 tablet by mouth 2 (two) times daily.      . INVOKANA 300 MG TABS tablet TAKE 1 TABLET BY MOUTH EVERY DAY 90 tablet 0  . levothyroxine (SYNTHROID, LEVOTHROID) 100 MCG tablet Take 1 tablet on an empty stomach for 30 minutes every morning 90 tablet 1  . losartan-hydrochlorothiazide (HYZAAR) 100-25 MG tablet Take 1 tablet by mouth  daily 90 tablet 0  . LYSINE PO Take 1 tablet by mouth at bedtime.      . metFORMIN (GLUCOPHAGE) 1000 MG tablet Take  1 tablet by mouth  twice a day for diabetes 180 tablet 1  . Multiple Vitamins-Minerals (MULTIVITAMIN WITH MINERALS) tablet Take 1 tablet by mouth daily.      . pantoprazole (PROTONIX) 40 MG tablet Take 1 tablet by mouth  every day 90 tablet 1  . temazepam (RESTORIL) 30 MG capsule TAKE 1 CAPSULE BY MOUTH ONCE DAILY AT BEDTIME 90 capsule 1  . valACYclovir (VALTREX) 500 MG tablet TAKE 1 TABLET BY MOUTH TWO  TIMES DAILY AS NEEDED 180 tablet 0   No current facility-administered medications on file prior to visit.     Medical History:  Past Medical History:  Diagnosis Date  . Arthritis   . Constipation, chronic   . Diverticulitis   . Diverticulosis   . Fibroid uterus   . History of gallstones   . Hyperlipidemia   . Hypertension   . Hypothyroidism   . Migraine   . PCOS (polycystic ovarian syndrome)    takes metformin for this  . Prediabetes   . Thyroid disease   . Type II or unspecified type diabetes mellitus without mention of complication, not stated as uncontrolled     Allergies: No Known Allergies   Review of Systems:  Review of Systems  Constitutional: Negative for chills, fever and malaise/fatigue.  HENT: Negative for congestion, ear pain and sore throat.   Eyes: Negative.   Respiratory: Negative for cough, shortness of breath and wheezing.   Cardiovascular: Negative for chest pain, palpitations and leg swelling.  Gastrointestinal: Negative for abdominal pain, blood in stool, constipation, diarrhea, heartburn and melena.  Genitourinary: Negative.   Skin: Negative.   Neurological: Negative for dizziness, sensory change, loss of consciousness and headaches.  Psychiatric/Behavioral: Negative for depression. The patient is not nervous/anxious and does not have insomnia.     Family history- Review and unchanged  Social history- Review and unchanged  Physical Exam: BP 128/80   Pulse 98   Temp 97.8 F (36.6 C) (Temporal)   Resp 16   Ht 5\' 1"  (1.549 m)   Wt 176 lb (79.8  kg)   BMI 33.25 kg/m  Wt Readings from Last 3 Encounters:  03/18/16 176 lb (79.8 kg)  12/13/15 171 lb (77.6 kg)  07/16/15 158 lb 3.2 oz (71.8 kg)    General Appearance: Well nourished well developed, in no apparent distress. Eyes: PERRLA, EOMs, conjunctiva no swelling or erythema ENT/Mouth: Ear canals normal without obstruction, swelling, erythma, discharge.  TMs normal bilaterally.  Oropharynx moist, clear, without exudate, or postoropharyngeal swelling. Neck: Supple, thyroid normal,no cervical adenopathy  Respiratory: Respiratory effort normal, Breath sounds clear A&P without rhonchi, wheeze, or rale.  No retractions, no accessory usage. Cardio: RRR with no MRGs. Brisk peripheral pulses without edema.  Abdomen: Soft, + BS,  Non tender, no guarding, rebound, hernias, masses. Musculoskeletal: Full ROM, 5/5 strength, Normal gait Skin: Warm, dry without rashes, lesions, ecchymosis.  Neuro: Awake and oriented X 3, Cranial nerves intact. Normal muscle tone, no cerebellar symptoms. Psych: Normal affect, Insight and Judgment appropriate.    Starlyn Skeans, PA-C 4:50 PM [facility]

## 2016-03-28 ENCOUNTER — Other Ambulatory Visit: Payer: Self-pay | Admitting: Obstetrics and Gynecology

## 2016-03-28 DIAGNOSIS — Z1231 Encounter for screening mammogram for malignant neoplasm of breast: Secondary | ICD-10-CM

## 2016-04-24 ENCOUNTER — Ambulatory Visit
Admission: RE | Admit: 2016-04-24 | Discharge: 2016-04-24 | Disposition: A | Discharge status: Home / Self Care | Payer: 59 | Source: Ambulatory Visit | Attending: Obstetrics and Gynecology | Admitting: Obstetrics and Gynecology

## 2016-04-24 DIAGNOSIS — Z1231 Encounter for screening mammogram for malignant neoplasm of breast: Secondary | ICD-10-CM

## 2016-05-17 ENCOUNTER — Other Ambulatory Visit: Payer: Self-pay | Admitting: Internal Medicine

## 2016-05-23 ENCOUNTER — Other Ambulatory Visit: Payer: Self-pay | Admitting: Internal Medicine

## 2016-05-23 ENCOUNTER — Other Ambulatory Visit: Payer: Self-pay | Admitting: Physician Assistant

## 2016-06-05 ENCOUNTER — Other Ambulatory Visit: Payer: Self-pay | Admitting: Internal Medicine

## 2016-07-03 ENCOUNTER — Ambulatory Visit (INDEPENDENT_AMBULATORY_CARE_PROVIDER_SITE_OTHER): Payer: 59 | Admitting: Internal Medicine

## 2016-07-03 ENCOUNTER — Encounter: Payer: Self-pay | Admitting: Internal Medicine

## 2016-07-03 VITALS — BP 126/80 | HR 88 | Temp 98.0°F | Resp 16 | Ht 61.0 in | Wt 172.0 lb

## 2016-07-03 DIAGNOSIS — I10 Essential (primary) hypertension: Secondary | ICD-10-CM | POA: Diagnosis not present

## 2016-07-03 DIAGNOSIS — Z79899 Other long term (current) drug therapy: Secondary | ICD-10-CM | POA: Diagnosis not present

## 2016-07-03 DIAGNOSIS — R7303 Prediabetes: Secondary | ICD-10-CM

## 2016-07-03 DIAGNOSIS — E6609 Other obesity due to excess calories: Secondary | ICD-10-CM | POA: Diagnosis not present

## 2016-07-03 DIAGNOSIS — E039 Hypothyroidism, unspecified: Secondary | ICD-10-CM

## 2016-07-03 DIAGNOSIS — E782 Mixed hyperlipidemia: Secondary | ICD-10-CM | POA: Diagnosis not present

## 2016-07-03 DIAGNOSIS — E559 Vitamin D deficiency, unspecified: Secondary | ICD-10-CM | POA: Diagnosis not present

## 2016-07-03 DIAGNOSIS — E282 Polycystic ovarian syndrome: Secondary | ICD-10-CM

## 2016-07-03 DIAGNOSIS — Z6832 Body mass index (BMI) 32.0-32.9, adult: Secondary | ICD-10-CM

## 2016-07-03 MED ORDER — PHENTERMINE HCL 37.5 MG PO TABS: 37.5000 mg | ORAL_TABLET | Freq: Every day | ORAL | 2 refills | Status: DC

## 2016-07-03 NOTE — Progress Notes (Signed)
Assessment and Plan:  Hypertension:  -well controlled currently -on ARB -Continue medication,  -monitor blood pressure at home.  -Continue DASH diet.   -Reminder to go to the ER if any CP, SOB, nausea, dizziness, severe HA, changes vision/speech, left arm numbness and tingling, and jaw pain.  Cholesterol: -LDL goal of less than 70, not at goal -Continue diet and exercise.   Diabetes: -cont invokana -tolerating well -CBGs appropriate at home -Continue diet and exercise.   Vitamin D Def: -continue medications.   Obesity (BMI 32) -phentermine -cont diet and exercise -is currently exercising 4 days a week  PCOS -cont current HRT -recommended daily ASA for DVT prevention   Continue diet and meds as discussed. Further disposition pending results of labs.  HPI 56 y.o. female  presents for 3 month follow up with hypertension, hyperlipidemia, prediabetes and vitamin D.   Her blood pressure has been controlled at home, today their BP is BP: 126/80.   She does workout. She denies chest pain, shortness of breath, dizziness.  She is doing [employer].  She felt like the new diet and stress was too much and she stopped.  She felt like it was overly restrictive.  She reports that she has just been having a lot going on.     She is on cholesterol medication and denies myalgias. Her cholesterol is at goal. The cholesterol last visit was:   Lab Results  Component Value Date   CHOL 189 12/13/2015   HDL 59 12/13/2015   LDLCALC 111 12/13/2015   TRIG 97 12/13/2015   CHOLHDL 3.2 12/13/2015     She has been working on diet and exercise for prediabetes, and denies foot ulcerations, hyperglycemia, hypoglycemia , increased appetite, nausea, paresthesia of the feet, polydipsia, polyuria, visual disturbances, vomiting and weight loss. Last A1C in the office was:  Lab Results  Component Value Date   HGBA1C 5.1 12/13/2015    Patient is on Vitamin D supplement.  Lab Results  Component  Value Date   VD25OH 78 12/13/2015     She is still grieving over her mother although she reports that she is back to doing much more activity now.    She is interested in trying something to help her with weight loss.  In the past she has done well with phentermine.  She reports that she did get some mild hair loss with it.    Current Medications:  Current Outpatient Prescriptions on File Prior to Visit  Medication Sig Dispense Refill  . AMABELZ 1-0.5 MG tablet TAKE 1 TABLET BY MOUTH  DAILY 84 tablet 0  . Aspirin (STANBACK HEADACHE POWDER PO) Take by mouth.    Marland Kitchen CALCIUM-VITAMIN D PO Take 1 tablet by mouth daily.    Marland Kitchen GARLIC PO Take 1 tablet by mouth 2 (two) times daily.      . INVOKANA 300 MG TABS tablet TAKE 1 TABLET BY MOUTH EVERY DAY 90 tablet 0  . levothyroxine (SYNTHROID, LEVOTHROID) 100 MCG tablet TAKE 1 TABLET ON AN EMPTY  STOMACH FOR 30 MINUTES  EVERY MORNING 90 tablet 0  . losartan-hydrochlorothiazide (HYZAAR) 100-25 MG tablet TAKE 1 TABLET BY MOUTH  DAILY 90 tablet 0  . LYSINE PO Take 1 tablet by mouth at bedtime.      . metFORMIN (GLUCOPHAGE) 1000 MG tablet Take 1 tablet by mouth  twice a day for diabetes 180 tablet 1  . Multiple Vitamins-Minerals (MULTIVITAMIN WITH MINERALS) tablet Take 1 tablet by mouth daily.      Marland Kitchen  pantoprazole (PROTONIX) 40 MG tablet Take 1 tablet by mouth  every day 90 tablet 1  . temazepam (RESTORIL) 30 MG capsule TAKE 1 CAPSULE BY MOUTH ONCE DAILY AT BEDTIME 90 capsule 1  . valACYclovir (VALTREX) 500 MG tablet TAKE 1 TABLET BY MOUTH TWO  TIMES DAILY AS NEEDED 180 tablet 0   No current facility-administered medications on file prior to visit.     Medical History:  Past Medical History:  Diagnosis Date  . Arthritis   . Constipation, chronic   . Diverticulitis   . Diverticulosis   . Fibroid uterus   . History of gallstones   . Hyperlipidemia   . Hypertension   . Hypothyroidism   . Migraine   . PCOS (polycystic ovarian syndrome)    takes  metformin for this  . Prediabetes   . Thyroid disease   . Type II or unspecified type diabetes mellitus without mention of complication, not stated as uncontrolled     Allergies: No Known Allergies   Review of Systems:  Review of Systems  Constitutional: Negative for chills, fever and malaise/fatigue.  HENT: Negative for congestion, ear pain and sore throat.   Eyes: Negative.   Respiratory: Negative for cough, shortness of breath and wheezing.   Cardiovascular: Negative for chest pain, palpitations and leg swelling.  Gastrointestinal: Negative for abdominal pain, blood in stool, constipation, diarrhea, heartburn and melena.  Genitourinary: Negative.   Skin: Negative.   Neurological: Negative for dizziness, sensory change, loss of consciousness and headaches.  Psychiatric/Behavioral: Negative for depression. The patient is not nervous/anxious and does not have insomnia.     Family history- Review and unchanged  Social history- Review and unchanged  Physical Exam: BP 126/80   Pulse 88   Temp 98 F (36.7 C) (Temporal)   Resp 16   Ht 5\' 1"  (1.549 m)   Wt 172 lb (78 kg)   BMI 32.50 kg/m  Wt Readings from Last 3 Encounters:  07/03/16 172 lb (78 kg)  03/18/16 176 lb (79.8 kg)  12/13/15 171 lb (77.6 kg)    General Appearance: Obese, Well nourished well developed, AAF in no apparent distress. Eyes: PERRLA, EOMs, conjunctiva no swelling or erythema ENT/Mouth: Ear canals normal without obstruction, swelling, erythma, discharge.  TMs normal bilaterally.  Oropharynx moist, clear, without exudate, or postoropharyngeal swelling. Neck: Supple, thyroid normal,no cervical adenopathy  Respiratory: Respiratory effort normal, Breath sounds clear A&P without rhonchi, wheeze, or rale.  No retractions, no accessory usage. Cardio: RRR with no MRGs. Brisk peripheral pulses without edema.  Abdomen: Soft, + BS,  Non tender, no guarding, rebound, hernias, masses. Musculoskeletal: Full ROM, 5/5  strength, Normal gait Skin: Warm, dry without rashes, lesions, ecchymosis.  Neuro: Awake and oriented X 3, Cranial nerves intact. Normal muscle tone, no cerebellar symptoms. Psych: Normal affect, Insight and Judgment appropriate.    Starlyn Skeans, PA-C 4:41 PM [facility]

## 2016-07-13 ENCOUNTER — Other Ambulatory Visit: Payer: Self-pay | Admitting: Internal Medicine

## 2016-07-13 NOTE — Telephone Encounter (Signed)
-   Please call Temazepam 

## 2016-07-16 ENCOUNTER — Other Ambulatory Visit: Payer: Self-pay | Admitting: Internal Medicine

## 2016-08-06 ENCOUNTER — Other Ambulatory Visit: Payer: Self-pay | Admitting: Internal Medicine

## 2016-09-02 ENCOUNTER — Other Ambulatory Visit: Payer: Self-pay | Admitting: Internal Medicine

## 2016-09-22 ENCOUNTER — Other Ambulatory Visit: Payer: Self-pay | Admitting: Physician Assistant

## 2016-09-30 ENCOUNTER — Ambulatory Visit: Payer: Self-pay | Admitting: Physician Assistant

## 2016-10-01 ENCOUNTER — Other Ambulatory Visit: Payer: Self-pay | Admitting: Internal Medicine

## 2016-10-21 ENCOUNTER — Encounter: Payer: Self-pay | Admitting: Physician Assistant

## 2016-10-22 ENCOUNTER — Other Ambulatory Visit: Payer: Self-pay | Admitting: Internal Medicine

## 2016-10-22 NOTE — Telephone Encounter (Signed)
Restoril was called into pharmacy on 27th June 2018 @8 :46am by DD

## 2016-10-22 NOTE — Telephone Encounter (Signed)
Please call Temazepam 

## 2016-10-25 ENCOUNTER — Other Ambulatory Visit: Payer: Self-pay | Admitting: Internal Medicine

## 2016-11-06 ENCOUNTER — Other Ambulatory Visit: Payer: Self-pay

## 2016-11-06 MED ORDER — METFORMIN HCL 1000 MG PO TABS: ORAL_TABLET | ORAL | 0 refills | Status: DC

## 2016-12-20 ENCOUNTER — Other Ambulatory Visit: Payer: Self-pay | Admitting: Internal Medicine

## 2016-12-21 ENCOUNTER — Other Ambulatory Visit: Payer: Self-pay | Admitting: Internal Medicine

## 2016-12-21 NOTE — Telephone Encounter (Signed)
Please call temaze

## 2016-12-22 NOTE — Telephone Encounter (Signed)
Restoril called into pharmacy on Aug 27th 2018 at 8:32am byDD

## 2016-12-24 ENCOUNTER — Encounter: Payer: Self-pay | Admitting: Physician Assistant

## 2016-12-24 NOTE — Progress Notes (Signed)
Complete Physical  Assessment and Plan:  Essential hypertension - continue medications, DASH diet, exercise and monitor at home. Call if greater than 130/80.  -     CBC with Differential/Platelet -     BASIC METABOLIC PANEL WITH GFR -     Hepatic function panel -     TSH -     Urinalysis, Routine w reflex microscopic -     Microalbumin / creatinine urine ratio -     EKG 12-Lead  Hypothyroidism, unspecified type Hypothyroidism-check TSH level, continue medications the same, reminded to take on an empty stomach 30-58mins before food.  -     TSH  Mixed hyperlipidemia -continue medications, check lipids, decrease fatty foods, increase activity.  -     Lipid panel  Prediabetes Discussed general issues about diabetes pathophysiology and management., Educational material distributed., Suggested low cholesterol diet., Encouraged aerobic exercise., Discussed foot care., Reminded to get yearly retinal exam. -     Hemoglobin A1c  Vitamin D deficiency -     VITAMIN D 25 Hydroxy (Vit-D Deficiency, Fractures)  Medication management -     Magnesium  Screening, anemia, deficiency, iron -     Iron,Total/Total Iron Binding Cap -     Vitamin B12  Class 2 severe obesity due to excess calories with serious comorbidity and body mass index (BMI) of 35.0 to 35.9 in adult (HCC) -     Liraglutide -Weight Management (SAXENDA) 18 MG/3ML SOPN; Inject 3 mg into the skin daily. Inject one pen SQ daily for weight loss  Plantar Faciitis-  Conservative treatment, night time orthotics, arch support, RICE, NSAID, stretches given If not better will do injection of dexamethasone  Encounter for general adult medical examination with abnormal findings 1 year  Other migraine without status migrainosus, not intractable Avoid triggers  Irritable bowel syndrome, unspecified type If not on benefiber then add it, decrease stress,  if any worsening symptoms, blood in stool, AB pain, etc call office  Other  constipation Increase water and fiber  PCOS (polycystic ovarian syndrome) Weight loss advised  Arthritis Weight loss advised   Discussed med's effects and SE's. Screening labs and tests as requested with regular follow-up as recommended.  HPI  56 y.o. female  presents for a complete physical.  Her blood pressure has been controlled at home, today their BP is BP: 128/70.  She does not workout. She denies chest pain, shortness of breath, dizziness.    She is on cholesterol medication and denies myalgias. Her cholesterol is at goal. The cholesterol last visit was:  Lab Results  Component Value Date   CHOL 189 12/13/2015   HDL 59 12/13/2015   LDLCALC 111 12/13/2015   TRIG 97 12/13/2015   CHOLHDL 3.2 12/13/2015  .  She has been working on diet and exercise for prediabetes, she is on bASA, she is on ACE/ARB and denies foot ulcerations, hyperglycemia, hypoglycemia , increased appetite, nausea, paresthesia of the feet, polydipsia, polyuria, visual disturbances, vomiting and weight loss. Last A1C in the office was:  Lab Results  Component Value Date   HGBA1C 5.1 12/13/2015    Patient is on Vitamin D supplement.   Lab Results  Component Value Date   VD25OH 43 12/13/2015     She reports that she is doing weight watchers currently.  She reports that she is not doing this right now.   BMI is Body mass index is 36.09 kg/m., she is working on diet and exercise. 1 year and 7 month since  her mom died, step father died recently, increase stress. Has been working out, hurt left foot on heel, worse for sitting for a long time,better after walking.  Wt Readings from Last 3 Encounters:  12/25/16 184 lb 12.8 oz (83.8 kg)  07/03/16 172 lb (78 kg)  03/18/16 176 lb (79.8 kg)     Current Medications:  Current Outpatient Prescriptions on File Prior to Visit  Medication Sig Dispense Refill  . AMABELZ 1-0.5 MG tablet TAKE 1 TABLET BY MOUTH  DAILY 84 tablet 1  . Aspirin (STANBACK HEADACHE POWDER  PO) Take by mouth.    Marland Kitchen CALCIUM-VITAMIN D PO Take 1 tablet by mouth daily.    Marland Kitchen GARLIC PO Take 1 tablet by mouth 2 (two) times daily.      . INVOKANA 300 MG TABS tablet TAKE 1 TABLET BY MOUTH DAILY 90 tablet 0  . levothyroxine (SYNTHROID, LEVOTHROID) 100 MCG tablet TAKE 1 TABLET BY MOUTH  EVERY MORNING ON AN EMPTY  STOMACH 30 MINUTES BEFORE  BREAKFAST 30 tablet 0  . losartan-hydrochlorothiazide (HYZAAR) 100-25 MG tablet TAKE 1 TABLET BY MOUTH  DAILY 90 tablet 0  . LYSINE PO Take 1 tablet by mouth at bedtime.      . metFORMIN (GLUCOPHAGE) 1000 MG tablet TAKE 1 TABLET BY MOUTH  TWICE A DAY FOR DIABETES 30 tablet 0  . Multiple Vitamins-Minerals (MULTIVITAMIN WITH MINERALS) tablet Take 1 tablet by mouth daily.      . pantoprazole (PROTONIX) 40 MG tablet TAKE 1 TABLET BY MOUTH  EVERY DAY 90 tablet 1  . temazepam (RESTORIL) 30 MG capsule TAKE 1 CAPSULE BY MOUTH EVERY DAY AT BEDTIME 30 capsule 0  . valACYclovir (VALTREX) 500 MG tablet TAKE 1 TABLET BY MOUTH TWO  TIMES DAILY AS NEEDED 180 tablet 0   No current facility-administered medications on file prior to visit.     Health Maintenance:   Immunization History  Administered Date(s) Administered  . Influenza,inj,quad, With Preservative 03/18/2016  . Influenza-Unspecified 02/26/2015  . Tdap 07/08/2011  . Zoster 03/21/2014    Tetanus: 2013 Flu vaccine: 2017 Zostavax: 2015 Pap: 2017 MGM:03/2016  DEXA: 2013 Colonoscopy: 2012 Last Eye Exam:  Dr. Gershon Crane 2017  Patient Care Team: Unk Pinto, MD as PCP - General (Internal Medicine) Lafayette Dragon, MD (Inactive) as Consulting Physician (Gastroenterology) Servando Salina, MD as Consulting Physician (Obstetrics and Gynecology)  Allergies: No Known Allergies  Medical History:  Past Medical History:  Diagnosis Date  . Arthritis   . Constipation, chronic   . Diverticulitis   . Diverticulosis   . Fibroid uterus   . History of gallstones   . Hyperlipidemia   . Hypertension   .  Hypothyroidism   . Migraine   . PCOS (polycystic ovarian syndrome)    takes metformin for this  . Prediabetes   . Thyroid disease   . Type II or unspecified type diabetes mellitus without mention of complication, not stated as uncontrolled     Surgical History:  Past Surgical History:  Procedure Laterality Date  . BREAST REDUCTION SURGERY  1988  . BREAST SURGERY    . CHOLECYSTECTOMY  1995  . EYE SURGERY    . LASIK Left   . REFRACTIVE SURGERY Left   . TONSILLECTOMY AND ADENOIDECTOMY  1990's    Family History:  Family History  Problem Relation Age of Onset  . Colon cancer Neg Hx   . Diabetes Mother   . Lung cancer Mother   . COPD Mother   .  Diverticulitis Mother   . Leukemia Mother   . Heart disease Mother   . Diabetes Sister     Social History:  Social History  Substance Use Topics  . Smoking status: Never Smoker  . Smokeless tobacco: Never Used  . Alcohol use Yes     Comment: rarely    Review of Systems: Review of Systems  Constitutional: Negative for chills, fever and malaise/fatigue.  HENT: Negative for congestion, ear pain and sore throat.   Eyes: Negative.   Respiratory: Negative for cough, shortness of breath and wheezing.   Cardiovascular: Negative for chest pain, palpitations and leg swelling.  Gastrointestinal: Negative for abdominal pain, blood in stool, constipation, diarrhea, heartburn and melena.  Genitourinary: Negative.   Skin: Negative.   Neurological: Negative for dizziness, sensory change, loss of consciousness and headaches.  Psychiatric/Behavioral: Negative for depression. The patient is not nervous/anxious and does not have insomnia.     Physical Exam: Estimated body mass index is 36.09 kg/m as calculated from the following:   Height as of this encounter: 5' (1.524 m).   Weight as of this encounter: 184 lb 12.8 oz (83.8 kg). BP 128/70   Pulse (!) 111   Temp (!) 97.3 F (36.3 C)   Resp 14   Ht 5' (1.524 m)   Wt 184 lb 12.8 oz  (83.8 kg)   SpO2 97%   BMI 36.09 kg/m   General Appearance: Well nourished well developed, in no apparent distress.  Eyes: PERRLA, EOMs, conjunctiva no swelling or erythema ENT/Mouth: Ear canals normal without obstruction, swelling, erythema, or discharge.  TMs normal bilaterally with no erythema, bulging, retraction, or loss of landmark.  Oropharynx moist and clear with no exudate, erythema, or swelling.   Neck: Supple, thyroid normal. No bruits.  No cervical adenopathy Respiratory: Respiratory effort normal, Breath sounds clear A&P without wheeze, rhonchi, rales.   Cardio: RRR without murmurs, rubs or gallops. Brisk peripheral pulses without edema.  Chest: symmetric, with normal excursions Breasts: Deferred to obgyn Abdomen: Soft, nontender, no guarding, rebound, hernias, masses, or organomegaly.  Lymphatics: Non tender without lymphadenopathy.  Musculoskeletal: Full ROM all peripheral extremities,5/5 strength, and normal gait.  Skin: Warm, dry without rashes, lesions, ecchymosis. Neuro: Awake and oriented X 3, Cranial nerves intact, reflexes equal bilaterally. Normal muscle tone, no cerebellar symptoms. Sensation intact.  Psych:  normal affect, Insight and Judgment appropriate.   EKG: WNL sinus tachy no ST changes.  Over 40 minutes of exam, counseling, chart review and critical decision making was performed  Vicie Mutters 3:24 PM [facility]

## 2016-12-25 ENCOUNTER — Encounter: Payer: Self-pay | Admitting: Physician Assistant

## 2016-12-25 ENCOUNTER — Ambulatory Visit (INDEPENDENT_AMBULATORY_CARE_PROVIDER_SITE_OTHER): Payer: 59 | Admitting: Physician Assistant

## 2016-12-25 VITALS — BP 128/70 | HR 111 | Temp 97.3°F | Resp 14 | Ht 60.0 in | Wt 184.8 lb

## 2016-12-25 DIAGNOSIS — R6889 Other general symptoms and signs: Secondary | ICD-10-CM | POA: Diagnosis not present

## 2016-12-25 DIAGNOSIS — G43809 Other migraine, not intractable, without status migrainosus: Secondary | ICD-10-CM

## 2016-12-25 DIAGNOSIS — E039 Hypothyroidism, unspecified: Secondary | ICD-10-CM | POA: Diagnosis not present

## 2016-12-25 DIAGNOSIS — Z136 Encounter for screening for cardiovascular disorders: Secondary | ICD-10-CM

## 2016-12-25 DIAGNOSIS — Z0001 Encounter for general adult medical examination with abnormal findings: Secondary | ICD-10-CM | POA: Diagnosis not present

## 2016-12-25 DIAGNOSIS — Z79899 Other long term (current) drug therapy: Secondary | ICD-10-CM

## 2016-12-25 DIAGNOSIS — M1990 Unspecified osteoarthritis, unspecified site: Secondary | ICD-10-CM

## 2016-12-25 DIAGNOSIS — K589 Irritable bowel syndrome without diarrhea: Secondary | ICD-10-CM

## 2016-12-25 DIAGNOSIS — E782 Mixed hyperlipidemia: Secondary | ICD-10-CM

## 2016-12-25 DIAGNOSIS — K5909 Other constipation: Secondary | ICD-10-CM

## 2016-12-25 DIAGNOSIS — I10 Essential (primary) hypertension: Secondary | ICD-10-CM | POA: Diagnosis not present

## 2016-12-25 DIAGNOSIS — Z130 Encounter for screening for diseases of the blood and blood-forming organs and certain disorders involving the immune mechanism: Secondary | ICD-10-CM

## 2016-12-25 DIAGNOSIS — Z6835 Body mass index (BMI) 35.0-35.9, adult: Secondary | ICD-10-CM

## 2016-12-25 DIAGNOSIS — R7303 Prediabetes: Secondary | ICD-10-CM | POA: Diagnosis not present

## 2016-12-25 DIAGNOSIS — E559 Vitamin D deficiency, unspecified: Secondary | ICD-10-CM

## 2016-12-25 DIAGNOSIS — M722 Plantar fascial fibromatosis: Secondary | ICD-10-CM

## 2016-12-25 DIAGNOSIS — E282 Polycystic ovarian syndrome: Secondary | ICD-10-CM

## 2016-12-25 MED ORDER — LIRAGLUTIDE -WEIGHT MANAGEMENT 18 MG/3ML ~~LOC~~ SOPN: 3.0000 mg | PEN_INJECTOR | Freq: Every day | SUBCUTANEOUS | 3 refills | Status: DC

## 2016-12-25 NOTE — Patient Instructions (Addendum)
STOP THE INVOKANA  Drink 80-100 oz a day of water, measure it out Eat 3 meals a day, have to do breakfast, eat protein- hard boiled eggs, protein bar like [place] protein bar, greek yogurt like oikos triple zero, chobani 100, or light n fit greek  We want weight loss that will last so you should lose 1-2 pounds a week.  THAT IS IT! Please pick THREE things a month to change. Once it is a habit check off the item. Then pick another three items off the list to become habits.  If you are already doing a habit on the list GREAT!  Cross that item off! o Don't drink your calories. Ie, alcohol, soda, fruit juice, and sweet tea.  o Drink more water. Drink a glass when you feel hungry or before each meal.  o Eat breakfast - Complex carb and protein (likeDannon light and fit yogurt, oatmeal, fruit, eggs, [place] bacon). o Measure your cereal.  Eat no more than one cup a day. (ie [place]) o Eat an apple a day. o Add a vegetable a day. o Try a new vegetable a month. o Use Pam! Stop using oil or butter to cook. o Don't finish your plate or use smaller plates. o Share your dessert. o Eat sugar free Jello for dessert or frozen grapes. o Don't eat 2-3 hours before bed. o Switch to whole wheat bread, pasta, and brown rice. o Make healthier choices when you eat out. No fries! o Pick baked chicken, NOT fried. o Don't forget to SLOW DOWN when you eat. It is not going anywhere.  o Take the stairs. o Park far away in the parking lot o [employer] (or weights) for 10 minutes while watching TV. o Walk at work for 10 minutes during break. o Walk outside 1 time a week with your friend, kids, dog, or significant other. o Start a walking group at [place] the mall as much as you can tolerate.  o Keep a food diary. o Weigh yourself daily. o Walk for 15 minutes 3 days per week. o Cook at home more often and eat out less.  If life happens and you go back to old habits, it is okay.  Just start over.  You can do it!   If you experience chest pain, get short of breath, or tired during the exercise, please stop immediately and inform your doctor.   Try the saxenda   Plantar Fasciitis Plantar fasciitis is a painful foot condition that affects the heel. It occurs when the band of tissue that connects the toes to the heel bone (plantar fascia) becomes irritated. This can happen after exercising too much or doing other repetitive activities (overuse injury). The pain from plantar fasciitis can range from mild irritation to severe pain that makes it difficult for you to walk or move. The pain is usually worse in the morning or after you have been sitting or lying down for a while. What are the causes? This condition may be caused by:  Standing for long periods of time.  Wearing shoes that do not fit.  Doing high-impact activities, including running, aerobics, and ballet.  Being overweight.  Having an abnormal way of walking (gait).  Having tight calf muscles.  Having high arches in your feet.  Starting a new athletic activity.  What are the signs or symptoms? The main symptom of this condition is heel pain. Other symptoms include:  Pain that gets worse after activity  or exercise.  Pain that is worse in the morning or after resting.  Pain that goes away after you walk for a few minutes.  How is this diagnosed? This condition may be diagnosed based on your signs and symptoms. Your health care provider will also do a physical exam to check for:  A tender area on the bottom of your foot.  A high arch in your foot.  Pain when you move your foot.  Difficulty moving your foot.  You may also need to have imaging studies to confirm the diagnosis. These can include:  X-rays.  Ultrasound.  MRI.  How is this treated? Treatment for plantar fasciitis depends on the severity of the condition. Your treatment may include:  Rest, ice, and over-the-counter pain medicines to manage  your pain.  Exercises to stretch your calves and your plantar fascia.  A splint that holds your foot in a stretched, upward position while you sleep (night splint).  Physical therapy to relieve symptoms and prevent problems in the future.  Cortisone injections to relieve severe pain.  Extracorporeal shock wave therapy (ESWT) to stimulate damaged plantar fascia with electrical impulses. It is often used as a last resort before surgery.  Surgery, if other treatments have not worked after 12 months.  Follow these instructions at home:  Take medicines only as directed by your health care provider.  Avoid activities that cause pain.  Roll the bottom of your foot over a bag of ice or a bottle of cold water. Do this for 20 minutes, 3-4 times a day.  Perform simple stretches as directed by your health care provider.  Try wearing athletic shoes with air-sole or gel-sole cushions or soft shoe inserts.  Wear a night splint while sleeping, if directed by your health care provider.  Keep all follow-up appointments with your health care provider. How is this prevented?  Do not perform exercises or activities that cause heel pain.  Consider finding low-impact activities if you continue to have problems.  Lose weight if you need to. The best way to prevent plantar fasciitis is to avoid the activities that aggravate your plantar fascia. Contact a health care provider if:  Your symptoms do not go away after treatment with home care measures.  Your pain gets worse.  Your pain affects your ability to move or do your daily activities. This information is not intended to replace advice given to you by your health care provider. Make sure you discuss any questions you have with your health care provider. Document Released: 01/07/2001 Document Revised: 09/17/2015 Document Reviewed: 02/22/2014 Elsevier Interactive Patient Education  [employer].  11 Tips to Follow:  1. No caffeine  after 3pm: Avoid beverages with caffeine (soda, tea, energy drinks, etc.) especially after 3pm. 2. Don't go to bed hungry: Have your evening meal at least 3 hrs. before going to sleep. It's fine to have a small bedtime snack such as a glass of milk and a few crackers but don't have a big meal. 3. Have a nightly routine before bed: Plan on "winding down" before you go to sleep. Begin relaxing about 1 hour before you go to bed. Try doing a quiet activity such as listening to calming music, reading a book or meditating. 4. Turn off the TV and ALL electronics including video games, tablets, laptops, etc. 1 hour before sleep, and keep them out of the bedroom. 5. Turn off your cell phone and all notifications (new email and text alerts) or even better,  leave your phone outside your room while you sleep. Studies have shown that a part of your brain continues to respond to certain lights and sounds even while you're still asleep. 6. Make your bedroom quiet, dark and cool. If you can't control the noise, try wearing earplugs or using a fan to block out other sounds. 7. Practice relaxation techniques. Try reading a book or meditating or drain your brain by writing a list of what you need to do the next day. 8. Don't nap unless you feel sick: you'll have a better night's sleep. 9. Don't smoke, or quit if you do. Nicotine, alcohol, and marijuana can all keep you awake. Talk to your health care provider if you need help with substance use. 10. Most importantly, wake up at the same time every day (or within 1 hour of your usual wake up time) EVEN on the weekends. A regular wake up time promotes sleep hygiene and prevents sleep problems. 11. Reduce exposure to bright light in the last three hours of the day before going to sleep. Maintaining good sleep hygiene and having good sleep habits lower your risk of developing sleep problems. Getting better sleep can also improve your concentration and alertness. Try the simple  steps in this guide. If you still have trouble getting enough rest, make an appointment with your health care provider.

## 2016-12-26 LAB — LIPID PANEL
Cholesterol: 197 mg/dL (ref <200)
HDL: 55 mg/dL (ref >50)
LDL Cholesterol (Calc): 122 mg/dL (calc) — ABNORMAL HIGH
NON-HDL CHOLESTEROL (CALC): 142 mg/dL — AB (ref <130)
Total CHOL/HDL Ratio: 3.6 (calc) (ref <5.0)
Triglycerides: 102 mg/dL (ref <150)

## 2016-12-26 LAB — IRON, TOTAL/TOTAL IRON BINDING CAP
%SAT: 11 % (calc) (ref 11–50)
IRON: 53 ug/dL (ref 45–160)
TIBC: 483 ug/dL — AB (ref 250–450)

## 2016-12-26 LAB — CBC WITH DIFFERENTIAL/PLATELET
BASOS ABS: 58 {cells}/uL (ref 0–200)
Basophils Relative: 0.7 %
EOS ABS: 249 {cells}/uL (ref 15–500)
EOS PCT: 3 %
HCT: 39.8 % (ref 35.0–45.0)
HEMOGLOBIN: 12.9 g/dL (ref 11.7–15.5)
Lymphs Abs: 2897 cells/uL (ref 850–3900)
MCH: 26.8 pg — AB (ref 27.0–33.0)
MCHC: 32.4 g/dL (ref 32.0–36.0)
MCV: 82.7 fL (ref 80.0–100.0)
MONOS PCT: 10.4 %
MPV: 9.3 fL (ref 7.5–12.5)
NEUTROS ABS: 4233 {cells}/uL (ref 1500–7800)
NEUTROS PCT: 51 %
PLATELETS: 413 10*3/uL — AB (ref 140–400)
RBC: 4.81 10*6/uL (ref 3.80–5.10)
RDW: 13 % (ref 11.0–15.0)
TOTAL LYMPHOCYTE: 34.9 %
WBC mixed population: 863 cells/uL (ref 200–950)
WBC: 8.3 10*3/uL (ref 3.8–10.8)

## 2016-12-26 LAB — MICROALBUMIN / CREATININE URINE RATIO: CREATININE, URINE: 48 mg/dL (ref 20–320)

## 2016-12-26 LAB — URINE CULTURE
MICRO NUMBER:: 80953039
SPECIMEN QUALITY:: ADEQUATE

## 2016-12-26 LAB — HEPATIC FUNCTION PANEL
AG Ratio: 1.6 (calc) (ref 1.0–2.5)
ALT: 15 U/L (ref 6–29)
AST: 14 U/L (ref 10–35)
Albumin: 4.2 g/dL (ref 3.6–5.1)
Alkaline phosphatase (APISO): 57 U/L (ref 33–130)
BILIRUBIN DIRECT: 0.1 mg/dL (ref 0.0–0.2)
BILIRUBIN INDIRECT: 0.3 mg/dL (ref 0.2–1.2)
GLOBULIN: 2.6 g/dL (ref 1.9–3.7)
TOTAL PROTEIN: 6.8 g/dL (ref 6.1–8.1)
Total Bilirubin: 0.4 mg/dL (ref 0.2–1.2)

## 2016-12-26 LAB — TSH: TSH: 1.15 m[IU]/L (ref 0.40–4.50)

## 2016-12-26 LAB — URINALYSIS, ROUTINE W REFLEX MICROSCOPIC
Bilirubin Urine: NEGATIVE
Hgb urine dipstick: NEGATIVE
Ketones, ur: NEGATIVE
Leukocytes, UA: NEGATIVE
Nitrite: NEGATIVE
Protein, ur: NEGATIVE
SPECIFIC GRAVITY, URINE: 1.025 (ref 1.001–1.03)
pH: 6.5 (ref 5.0–8.0)

## 2016-12-26 LAB — BASIC METABOLIC PANEL WITH GFR
BUN: 12 mg/dL (ref 7–25)
CHLORIDE: 100 mmol/L (ref 98–110)
CO2: 27 mmol/L (ref 20–32)
Calcium: 9.6 mg/dL (ref 8.6–10.4)
Creat: 0.74 mg/dL (ref 0.50–1.05)
GFR, Est African American: 105 mL/min/{1.73_m2} (ref >=60)
GFR, Est Non African American: 91 mL/min/{1.73_m2} (ref >=60)
GLUCOSE: 103 mg/dL — AB (ref 65–99)
POTASSIUM: 3.7 mmol/L (ref 3.5–5.3)
Sodium: 137 mmol/L (ref 135–146)

## 2016-12-26 LAB — HEMOGLOBIN A1C
EAG (MMOL/L): 6.2 (calc)
HEMOGLOBIN A1C: 5.5 %{Hb} (ref <5.7)
MEAN PLASMA GLUCOSE: 111 (calc)

## 2016-12-26 LAB — MAGNESIUM: Magnesium: 2.2 mg/dL (ref 1.5–2.5)

## 2016-12-26 LAB — VITAMIN B12: Vitamin B-12: 657 pg/mL (ref 200–1100)

## 2016-12-26 LAB — VITAMIN D 25 HYDROXY (VIT D DEFICIENCY, FRACTURES): Vit D, 25-Hydroxy: 77 ng/mL (ref 30–100)

## 2016-12-30 ENCOUNTER — Other Ambulatory Visit: Payer: Self-pay

## 2016-12-30 MED ORDER — ESTRADIOL-NORETHINDRONE ACET 1-0.5 MG PO TABS: 1.0000 | ORAL_TABLET | Freq: Every day | ORAL | 1 refills | Status: DC

## 2016-12-30 MED ORDER — PANTOPRAZOLE SODIUM 40 MG PO TBEC
40.0000 mg | DELAYED_RELEASE_TABLET | Freq: Every day | ORAL | 1 refills | Status: DC
Start: 2016-12-30 — End: 2017-10-04

## 2016-12-30 MED ORDER — LEVOTHYROXINE SODIUM 100 MCG PO TABS: ORAL_TABLET | ORAL | 0 refills | Status: DC

## 2016-12-30 MED ORDER — LOSARTAN POTASSIUM-HCTZ 100-25 MG PO TABS: 1.0000 | ORAL_TABLET | Freq: Every day | ORAL | 0 refills | Status: DC

## 2017-01-01 ENCOUNTER — Other Ambulatory Visit: Payer: Self-pay | Admitting: *Deleted

## 2017-01-01 MED ORDER — METFORMIN HCL 1000 MG PO TABS: ORAL_TABLET | ORAL | 1 refills | Status: DC

## 2017-01-05 ENCOUNTER — Ambulatory Visit (INDEPENDENT_AMBULATORY_CARE_PROVIDER_SITE_OTHER): Payer: 59

## 2017-01-05 ENCOUNTER — Encounter: Payer: Self-pay | Admitting: Family Medicine

## 2017-01-05 ENCOUNTER — Ambulatory Visit (INDEPENDENT_AMBULATORY_CARE_PROVIDER_SITE_OTHER): Payer: 59 | Admitting: Family Medicine

## 2017-01-05 VITALS — BP 131/83 | HR 96 | Temp 98.3°F | Resp 18 | Ht 59.84 in | Wt 183.8 lb

## 2017-01-05 DIAGNOSIS — K5900 Constipation, unspecified: Secondary | ICD-10-CM | POA: Diagnosis not present

## 2017-01-05 DIAGNOSIS — K5909 Other constipation: Secondary | ICD-10-CM

## 2017-01-05 DIAGNOSIS — R079 Chest pain, unspecified: Secondary | ICD-10-CM

## 2017-01-05 NOTE — Progress Notes (Signed)
9/10/20184:35 PM  Kelly Goodwin 11-20-1960, 56 y.o. female [id number]  Chief Complaint  Patient presents with  . Nausea    X 2 mth- with diarrhea  . Shortness of Breath    X 1 week- pt states that she had a spider bite aroud the time of sob and chest pain  . Chest Pain    X 1 week- pt states that she had a spider bite aroud the time of sob and back pain    HPI:   Patient is a 56 y.o. female who presents today for concerns that have been going for past several months. Patient reports long history of constipation however for about the past 2 months she has noticed weight gain, bloating, nausea w vomiting at times and watery mucous diarrhea. She denies fever or chills. She has not started GLP1, She is s/p lap chole. She denies any blood in stool. She had a colonscopy in 2012 that shows mild diverticulosis but otherwise normal. She tends to use laxatives.   Otherwise had what is presumed a spider bite a week ago, now resolved. The day after had increase vomiting, SOB, chest pain. Denies any swelling of lips, tongue or throat. SOB resolved. Still having intermittent substernal chest pain with activity and movement. Does not radiate.   Depression screen [facility] 2/9 01/05/2017 09/27/2014  Decreased Interest 0 0  Down, Depressed, Hopeless 0 0  PHQ - 2 Score 0 0    No Known Allergies  Current Outpatient Prescriptions on File Prior to Visit  Medication Sig Dispense Refill  . Aspirin (STANBACK HEADACHE POWDER PO) Take by mouth.    Marland Kitchen CALCIUM-VITAMIN D PO Take 1 tablet by mouth daily.    Marland Kitchen estradiol-norethindrone (AMABELZ) 1-0.5 MG tablet Take 1 tablet by mouth daily. 84 tablet 1  . GARLIC PO Take 1 tablet by mouth 2 (two) times daily.      Marland Kitchen levothyroxine (SYNTHROID, LEVOTHROID) 100 MCG tablet TAKE 1 TABLET BY MOUTH  EVERY MORNING ON AN EMPTY  STOMACH 30 MINUTES BEFORE  BREAKFAST 90 tablet 0  . Liraglutide -Weight Management (SAXENDA) 18 MG/3ML SOPN Inject 3 mg into the skin daily. Inject one  pen SQ daily for weight loss 4 pen 3  . losartan-hydrochlorothiazide (HYZAAR) 100-25 MG tablet Take 1 tablet by mouth daily. 90 tablet 0  . LYSINE PO Take 1 tablet by mouth at bedtime.      . metFORMIN (GLUCOPHAGE) 1000 MG tablet TAKE 1 TABLET BY MOUTH  TWICE A DAY FOR DIABETES 180 tablet 1  . Multiple Vitamins-Minerals (MULTIVITAMIN WITH MINERALS) tablet Take 1 tablet by mouth daily.      . pantoprazole (PROTONIX) 40 MG tablet Take 1 tablet (40 mg total) by mouth daily. 90 tablet 1  . temazepam (RESTORIL) 30 MG capsule TAKE 1 CAPSULE BY MOUTH EVERY DAY AT BEDTIME 30 capsule 0  . valACYclovir (VALTREX) 500 MG tablet TAKE 1 TABLET BY MOUTH TWO  TIMES DAILY AS NEEDED 180 tablet 0   No current facility-administered medications on file prior to visit.     Past Medical History:  Diagnosis Date  . Arthritis   . Constipation, chronic   . Diverticulitis   . Diverticulosis   . Fibroid uterus   . History of gallstones   . Hyperlipidemia   . Hypertension   . Hypothyroidism   . Migraine   . PCOS (polycystic ovarian syndrome)    takes metformin for this  . Prediabetes   . Thyroid disease   .  Type II or unspecified type diabetes mellitus without mention of complication, not stated as uncontrolled     Past Surgical History:  Procedure Laterality Date  . BREAST REDUCTION SURGERY  1988  . BREAST SURGERY    . CHOLECYSTECTOMY  1995  . EYE SURGERY    . LASIK Left   . REFRACTIVE SURGERY Left   . TONSILLECTOMY AND ADENOIDECTOMY  1990's    Social History  Substance Use Topics  . Smoking status: Never Smoker  . Smokeless tobacco: Never Used  . Alcohol use Yes     Comment: rarely    Family History  Problem Relation Age of Onset  . Diabetes Mother   . Lung cancer Mother   . COPD Mother   . Diverticulitis Mother   . Leukemia Mother   . Heart disease Mother   . Diabetes Sister   . Colon cancer Neg Hx     Review of Systems  Constitutional: Negative for chills, diaphoresis, fever,  malaise/fatigue and weight loss.  Respiratory: Negative for cough, hemoptysis and shortness of breath.   Cardiovascular: Positive for chest pain. Negative for palpitations, orthopnea, leg swelling and PND.  Gastrointestinal: Positive for constipation, diarrhea, nausea and vomiting. Negative for abdominal pain, blood in stool and melena.     OBJECTIVE:  Blood pressure 131/83, pulse 96, temperature 98.3 F (36.8 C), temperature source Oral, resp. rate 18, height 4' 11.84" (1.52 m), weight 183 lb 12.8 oz (83.4 kg), SpO2 97 %.  Physical Exam  Constitutional: She is oriented to person, place, and time and well-developed, well-nourished, and in no distress.  HENT:  Head: Normocephalic and atraumatic.  Mouth/Throat: Oropharynx is clear and moist. No oropharyngeal exudate.  Eyes: Pupils are equal, round, and reactive to light. EOM are normal. No scleral icterus.  Neck: Neck supple.  Cardiovascular: Normal rate, regular rhythm and normal heart sounds.  Exam reveals no gallop and no friction rub.   No murmur heard. Pulmonary/Chest: Effort normal and breath sounds normal. She has no wheezes. She has no rales. She exhibits tenderness.  Abdominal: Soft. Bowel sounds are normal. She exhibits no distension and no mass. There is no tenderness.  Stool burden palpated along splenic flexure  Musculoskeletal: She exhibits no edema.  Neurological: She is alert and oriented to person, place, and time. Gait normal.  Skin: Skin is warm and dry.   Dg Abd 2 Views  Result Date: 01/05/2017 CLINICAL DATA:  Constipation. EXAM: ABDOMEN - 2 VIEW COMPARISON:  Radiographs of January 05, 2013. FINDINGS: No abnormal bowel dilatation is noted. Status post cholecystectomy. Large amount of stool seen throughout the colon. There is no evidence of free air. Phleboliths are noted in the pelvis. IMPRESSION: Large stool burden.  No abnormal bowel dilatation is noted. Electronically Signed   By: Marijo Conception, M.D.   On:  01/05/2017 16:35   ASSESSMENT and PLAN:  1. Chest pain, unspecified type - EKG 12-Lead: NSR , no ST changes, normal intervals Discussed CP most likely costochondritis. Discussed conservative management.  Routine precautions discussed.   2. Other constipation Discussed diarrhea most likely from significant stool burden. No obstruction on xray. Discussed several treatment options for constipation. If no results call for rx for lactulose.  Routine precautions discussed. - DG Abd 2 Views; Future       Rutherford Guys, MD [facility] [place], [place] [address] Ph.  [phone] Fax [phone]

## 2017-01-05 NOTE — Patient Instructions (Signed)
     IF you received an x-ray today, you will receive an invoice from [facility]. Please contact  Radiology at [phone] with questions or concerns regarding your invoice.   IF you received labwork today, you will receive an invoice from [facility]. Please contact [facility] at [phone] with questions or concerns regarding your invoice.   Our billing staff will not be able to assist you with questions regarding bills from these companies.  You will be contacted with the lab results as soon as they are available. The fastest way to get your results is to activate your My Chart account. Instructions are located on the last page of this paperwork. If you have not heard from us regarding the results in 2 weeks, please contact this office.     

## 2017-01-07 ENCOUNTER — Telehealth: Payer: Self-pay | Admitting: Family Medicine

## 2017-01-07 ENCOUNTER — Encounter: Payer: Self-pay | Admitting: Family Medicine

## 2017-01-07 ENCOUNTER — Encounter: Payer: Self-pay | Admitting: Physician Assistant

## 2017-01-07 NOTE — Telephone Encounter (Signed)
PT CALLING BACK ABOUT RX I ADVISED HER THAT DOCTOR WAS NOT IN THE OFFICE SHE STATES THAT SHE NEED MEDICINE NOW SHE IS BLOCKED UP AND NEED IT TODAY AGAIN I ADVISED HER WHAT THE TIME FRAME WAS SHE DIDN'T WANT TO HEAR THAT PLEASE CALL PATIENT

## 2017-01-07 NOTE — Telephone Encounter (Signed)
Pt is calling back to see if the message for this medication has been addressed

## 2017-01-07 NOTE — Telephone Encounter (Signed)
Received call from patient expressing that she had put in multiple calls to get a RX called in. The patient informed me that she had been told by Dr. Pamella Pert that she could call in to get the prescription called in as she was trying out different medications. The patient was handed off to the clinical TL on 01/07/2017.  Best Number: [phone]   Please Advise

## 2017-01-08 ENCOUNTER — Other Ambulatory Visit: Payer: Self-pay | Admitting: Physician Assistant

## 2017-01-08 ENCOUNTER — Telehealth: Payer: Self-pay | Admitting: *Deleted

## 2017-01-08 MED ORDER — LACTULOSE 10 GM/15ML PO SOLN: 10.0000 g | Freq: Two times a day (BID) | ORAL | 0 refills | Status: DC | PRN

## 2017-01-08 NOTE — Telephone Encounter (Signed)
Spoke with patient regarding Rx for Lactulose(Chronulac) sent to pharmacy on 01/08/17.

## 2017-01-22 ENCOUNTER — Other Ambulatory Visit: Payer: Self-pay | Admitting: Internal Medicine

## 2017-01-23 NOTE — Telephone Encounter (Signed)
Restoril called into pharmacy on 28th Sept 2018 BY DD

## 2017-01-23 NOTE — Telephone Encounter (Signed)
Please call Temazepam

## 2017-02-19 NOTE — Telephone Encounter (Signed)
Error

## 2017-02-27 ENCOUNTER — Other Ambulatory Visit: Payer: Self-pay | Admitting: Internal Medicine

## 2017-02-28 NOTE — Telephone Encounter (Signed)
Please call Temazepam

## 2017-03-02 NOTE — Telephone Encounter (Signed)
restoril has been called into pharmacy on Nov 5th 2018 by DD

## 2017-03-03 ENCOUNTER — Other Ambulatory Visit: Payer: Self-pay | Admitting: Physician Assistant

## 2017-03-03 DIAGNOSIS — Z6835 Body mass index (BMI) 35.0-35.9, adult: Principal | ICD-10-CM

## 2017-03-03 MED ORDER — LIRAGLUTIDE -WEIGHT MANAGEMENT 18 MG/3ML ~~LOC~~ SOPN: 3.0000 mg | PEN_INJECTOR | Freq: Every day | SUBCUTANEOUS | 3 refills | Status: DC

## 2017-03-13 ENCOUNTER — Telehealth: Payer: Self-pay | Admitting: Physician Assistant

## 2017-03-13 ENCOUNTER — Other Ambulatory Visit: Payer: Self-pay

## 2017-03-13 DIAGNOSIS — Z6835 Body mass index (BMI) 35.0-35.9, adult: Principal | ICD-10-CM

## 2017-03-13 MED ORDER — LIRAGLUTIDE -WEIGHT MANAGEMENT 18 MG/3ML ~~LOC~~ SOPN: 3.0000 mg | PEN_INJECTOR | Freq: Every day | SUBCUTANEOUS | 3 refills | Status: DC

## 2017-03-13 NOTE — Telephone Encounter (Signed)
Refill has been re-sent to pharmacy on Nov 16th 2018 by DD

## 2017-03-13 NOTE — Telephone Encounter (Signed)
Patient called,  pharm did not receive Liraglutide -Weight Management (SAXENDA) 18 MG/3ML SOPN. please call in again. [facility] [address]

## 2017-03-16 ENCOUNTER — Other Ambulatory Visit: Payer: Self-pay | Admitting: Obstetrics and Gynecology

## 2017-03-16 DIAGNOSIS — Z1231 Encounter for screening mammogram for malignant neoplasm of breast: Secondary | ICD-10-CM

## 2017-03-23 ENCOUNTER — Telehealth: Payer: Self-pay | Admitting: Physician Assistant

## 2017-03-23 ENCOUNTER — Other Ambulatory Visit: Payer: Self-pay | Admitting: Internal Medicine

## 2017-03-23 DIAGNOSIS — Z6832 Body mass index (BMI) 32.0-32.9, adult: Secondary | ICD-10-CM

## 2017-03-23 DIAGNOSIS — I10 Essential (primary) hypertension: Secondary | ICD-10-CM

## 2017-03-23 DIAGNOSIS — E6609 Other obesity due to excess calories: Secondary | ICD-10-CM

## 2017-03-23 MED ORDER — NALTREXONE-BUPROPION HCL ER 8-90 MG PO TB12: ORAL_TABLET | ORAL | 3 refills | Status: DC

## 2017-03-23 NOTE — Telephone Encounter (Signed)
Has tried belviq, phentermine, can try contrave. If this does not help can refer to weight loss at [facility].

## 2017-03-23 NOTE — Telephone Encounter (Signed)
Patient called to advise, Kelly Goodwin is $800 mthly, please recommend another option.

## 2017-03-23 NOTE — Telephone Encounter (Signed)
Pt states she has tried the Contrave before and did not like it, so she would be willing to be referred to weight/loss at [facility].

## 2017-03-23 NOTE — Addendum Note (Signed)
Addended by: Vicie Mutters R on: 03/23/2017 01:15 PM   Modules accepted: Orders

## 2017-03-24 NOTE — Addendum Note (Signed)
Addended by: Vicie Mutters R on: 03/24/2017 07:34 AM   Modules accepted: Orders

## 2017-03-30 ENCOUNTER — Other Ambulatory Visit: Payer: Self-pay | Admitting: Internal Medicine

## 2017-03-31 NOTE — Telephone Encounter (Signed)
Rx called in 

## 2017-04-02 ENCOUNTER — Ambulatory Visit: Payer: Self-pay | Admitting: Physician Assistant

## 2017-04-16 ENCOUNTER — Encounter: Payer: Self-pay | Admitting: Physician Assistant

## 2017-04-16 ENCOUNTER — Ambulatory Visit (INDEPENDENT_AMBULATORY_CARE_PROVIDER_SITE_OTHER): Payer: 59 | Admitting: Physician Assistant

## 2017-04-16 VITALS — BP 126/80 | HR 91 | Temp 97.2°F | Resp 18 | Ht 59.5 in | Wt 176.2 lb

## 2017-04-16 DIAGNOSIS — R7303 Prediabetes: Secondary | ICD-10-CM

## 2017-04-16 DIAGNOSIS — I10 Essential (primary) hypertension: Secondary | ICD-10-CM | POA: Diagnosis not present

## 2017-04-16 DIAGNOSIS — Z6832 Body mass index (BMI) 32.0-32.9, adult: Secondary | ICD-10-CM | POA: Diagnosis not present

## 2017-04-16 DIAGNOSIS — E039 Hypothyroidism, unspecified: Secondary | ICD-10-CM | POA: Diagnosis not present

## 2017-04-16 DIAGNOSIS — E782 Mixed hyperlipidemia: Secondary | ICD-10-CM | POA: Diagnosis not present

## 2017-04-16 DIAGNOSIS — E6609 Other obesity due to excess calories: Secondary | ICD-10-CM | POA: Diagnosis not present

## 2017-04-16 DIAGNOSIS — Z79899 Other long term (current) drug therapy: Secondary | ICD-10-CM

## 2017-04-16 MED ORDER — TEMAZEPAM 30 MG PO CAPS: 30.0000 mg | ORAL_CAPSULE | Freq: Every day | ORAL | 0 refills | Status: DC

## 2017-04-16 NOTE — Progress Notes (Signed)
Assessment and Plan:  Essential hypertension - continue medications, DASH diet, exercise and monitor at home. Call if greater than 130/80.   Class 1 obesity due to excess calories with serious comorbidity and body mass index (BMI) of 32.0 to 32.9 in adult Discussed realistic expectations with the patient Will not lose 10-20 lbs in a month, she is down 10 lbs 3 months and losing 1lbs a week is sustainable weight loss Insurance will not cover weight loss meds Discussed disease progression and risks Discussed diet/exercise, weight management and risk modification Bring in journal food next visit Suggest psych follow up  Hypothyroidism, unspecified type Hypothyroidism-check TSH level, continue medications the same, reminded to take on an empty stomach 30-77mins before food.   Mixed hyperlipidemia -continue medications, check lipids, decrease fatty foods, increase activity.   Prediabetes Stop meds    Future Appointments  Date Time Provider [place]  05/07/2017  4:40 PM [facility] 3 [facility]  01/11/2018  3:00 PM Vicie Mutters, PA-C [facility] None     HPI 56 y.o.female presents for depression/anxiety 1 month follow and 3 month follow up   Her blood pressure has been controlled at home, today their BP is BP: 126/80  She does not workout. She denies chest pain, shortness of breath, dizziness.  She is on cholesterol medication and denies myalgias. Her cholesterol is at goal. The cholesterol last visit was:   Lab Results  Component Value Date   CHOL 197 12/25/2016   HDL 55 12/25/2016   LDLCALC 111 12/13/2015   TRIG 102 12/25/2016   CHOLHDL 3.6 12/25/2016    She has been working on diet and exercise for diabetes but with diet and 1/2 invokana a day she is now in the non DM range, she also has PCOS and denies paresthesia of the feet, polydipsia, polyuria and visual disturbances. Last A1C in the office was:  Lab Results  Component Value Date   HGBA1C 5.5  12/25/2016   Patient is on Vitamin D supplement.   Lab Results  Component Value Date   VD25OH 77 12/25/2016    BMI is Body mass index is 34.99 kg/m., she is working on diet and exercise. Wt Readings from Last 3 Encounters:  04/16/17 176 lb 3.2 oz (79.9 kg)  01/05/17 183 lb 12.8 oz (83.4 kg)  12/25/16 184 lb 12.8 oz (83.8 kg)    Past Medical History:  Diagnosis Date  . Arthritis   . Constipation, chronic   . Diverticulitis   . Diverticulosis   . Fibroid uterus   . History of gallstones   . Hyperlipidemia   . Hypertension   . Hypothyroidism   . Migraine   . PCOS (polycystic ovarian syndrome)    takes metformin for this  . Prediabetes   . Thyroid disease   . Type II or unspecified type diabetes mellitus without mention of complication, not stated as uncontrolled      No Known Allergies    Current Outpatient Medications on File Prior to Visit  Medication Sig Dispense Refill  . Aspirin (STANBACK HEADACHE POWDER PO) Take by mouth.    Marland Kitchen CALCIUM-VITAMIN D PO Take 1 tablet by mouth daily.    Marland Kitchen estradiol-norethindrone (AMABELZ) 1-0.5 MG tablet Take 1 tablet by mouth daily. 84 tablet 1  . GARLIC PO Take 1 tablet by mouth 2 (two) times daily.      . INVOKANA 300 MG TABS tablet TAKE 1 TABLET BY MOUTH EVERY DAY 90 tablet 0  . levothyroxine (SYNTHROID,  LEVOTHROID) 100 MCG tablet TAKE 1 TABLET BY MOUTH  EVERY MORNING ON AN EMPTY  STOMACH 30 MINUTES BEFORE  BREAKFAST 30 tablet 0  . losartan-hydrochlorothiazide (HYZAAR) 100-25 MG tablet Take 1 tablet by mouth daily. 90 tablet 0  . LYSINE PO Take 1 tablet by mouth at bedtime.      . metFORMIN (GLUCOPHAGE) 1000 MG tablet TAKE 1 TABLET BY MOUTH  TWICE A DAY FOR DIABETES 180 tablet 1  . Multiple Vitamins-Minerals (MULTIVITAMIN WITH MINERALS) tablet Take 1 tablet by mouth daily.      . pantoprazole (PROTONIX) 40 MG tablet Take 1 tablet (40 mg total) by mouth daily. 90 tablet 1  . temazepam (RESTORIL) 30 MG capsule TAKE ONE CAPSULE BY MOUTH  EVERY NIGHT AT BEDTIME 30 capsule 2  . valACYclovir (VALTREX) 500 MG tablet TAKE 1 TABLET BY MOUTH TWO  TIMES DAILY AS NEEDED 180 tablet 0   No current facility-administered medications on file prior to visit.     ROS: all negative except above.   Physical Exam: Filed Weights   04/16/17 1026  Weight: 176 lb 3.2 oz (79.9 kg)   BP 126/80   Pulse 91   Temp (!) 97.2 F (36.2 C)   Resp 18   Ht 4' 11.5" (1.511 m)   Wt 176 lb 3.2 oz (79.9 kg)   SpO2 98%   BMI 34.99 kg/m  General Appearance: Well nourished, in no apparent distress. Eyes: PERRLA, EOMs, conjunctiva no swelling or erythema Sinuses: No Frontal/maxillary tenderness ENT/Mouth: Ext aud canals clear, TMs without erythema, bulging. No erythema, swelling, or exudate on post pharynx.  Tonsils not swollen or erythematous. Hearing normal.  Neck: Supple, thyroid normal.  Respiratory: Respiratory effort normal, BS equal bilaterally without rales, rhonchi, wheezing or stridor.  Cardio: RRR with no MRGs. Brisk peripheral pulses without edema.  Abdomen: Soft, + BS.  Non tender, no guarding, rebound, hernias, masses. Lymphatics: Non tender without lymphadenopathy.  Musculoskeletal: Full ROM, 5/5 strength, normal gait.  Skin: Warm, dry without rashes, lesions, ecchymosis.  Neuro: Cranial nerves intact. Normal muscle tone, no cerebellar symptoms. Sensation intact.  Psych: Awake and oriented X 3, normal affect, tearful, Insight and Judgment appropriate.     Vicie Mutters, PA-C 10:51 AM [facility]

## 2017-04-16 NOTE — Patient Instructions (Signed)
Can go to weight loss clinic or can refer for counseling for weight loss  Bring in food journal      Bad carbs also include fruit juice, alcohol, and sweet tea. These are empty calories that do not signal to your brain that you are full.   Please remember the good carbs are still carbs which convert into sugar. So please measure them out no more than 1/2-1 cup of rice, oatmeal, pasta, and beans  Veggies are however free foods! Pile them on.   Not all fruit is created equal. Please see the list below, the fruit at the bottom is higher in sugars than the fruit at the top. Please avoid all dried fruits.

## 2017-04-23 ENCOUNTER — Ambulatory Visit: Payer: Self-pay | Admitting: Physician Assistant

## 2017-05-04 DIAGNOSIS — Z01419 Encounter for gynecological examination (general) (routine) without abnormal findings: Secondary | ICD-10-CM | POA: Diagnosis not present

## 2017-05-07 ENCOUNTER — Ambulatory Visit
Admission: RE | Admit: 2017-05-07 | Discharge: 2017-05-07 | Disposition: A | Discharge status: Home / Self Care | Payer: 59 | Source: Ambulatory Visit | Attending: Obstetrics and Gynecology | Admitting: Obstetrics and Gynecology

## 2017-05-07 DIAGNOSIS — Z1231 Encounter for screening mammogram for malignant neoplasm of breast: Secondary | ICD-10-CM

## 2017-05-13 ENCOUNTER — Ambulatory Visit (INDEPENDENT_AMBULATORY_CARE_PROVIDER_SITE_OTHER): Payer: 59 | Admitting: Adult Health

## 2017-05-13 ENCOUNTER — Encounter: Payer: Self-pay | Admitting: Adult Health

## 2017-05-13 VITALS — BP 138/82 | HR 89 | Temp 97.4°F | Ht 59.5 in | Wt 168.0 lb

## 2017-05-13 DIAGNOSIS — L740 Miliaria rubra: Secondary | ICD-10-CM

## 2017-05-13 MED ORDER — TRIAMCINOLONE ACETONIDE 0.1 % EX OINT: 1.0000 "application " | TOPICAL_OINTMENT | Freq: Two times a day (BID) | CUTANEOUS | 1 refills | Status: DC

## 2017-05-13 NOTE — Progress Notes (Signed)
Assessment and Plan:  Kelly Goodwin was seen today for rash.  Diagnoses and all orders for this visit:  Sweat rash       Vs contact dermatitis        Reassured appears benign and likely even self limiting        Stop applying alcohol or fragrances, cosmetic creams       Keep area clean and dry - use gentle soap and water       Keep uncovered -     triamcinolone ointment (KENALOG) 0.1 %; Apply 1 application topically 2 (two) times daily.       Contact office if area is significantly spreading, develops blisters/pain/discharge or any new concerning symptoms.   Further disposition pending results of labs. Discussed med's effects and SE's.   Over 15 minutes of exam, counseling, chart review, and critical decision making was performed.   Future Appointments  Date Time Provider [place]  01/11/2018  3:00 PM Vicie Mutters, PA-C [facility] None    ------------------------------------------------------------------------------------------------------------------   HPI BP 138/82   Pulse 89   Temp (!) 97.4 F (36.3 C)   Ht 4' 11.5" (1.511 m)   Wt 168 lb (76.2 kg)   SpO2 98%   BMI 33.36 kg/m   57 y.o.female presents for new rash on her neck x 1 day. She reports she noted this after a session in the gym - not itchy, does burn some if rubs on clothing, etc. She reports she has tried applying alcohol (caused burning), and a coworker gave her a "white cream" - that seemed to help. She did use a new fragrance prior to exercise that day, but denies other changes to hygiene/hair/makeup/detergent products.   Past Medical History:  Diagnosis Date  . Arthritis   . Constipation, chronic   . Diverticulitis   . Diverticulosis   . Fibroid uterus   . History of gallstones   . Hyperlipidemia   . Hypertension   . Hypothyroidism   . Migraine   . PCOS (polycystic ovarian syndrome)    takes metformin for this  . Prediabetes   . Thyroid disease   . Type II or unspecified type diabetes mellitus  without mention of complication, not stated as uncontrolled      No Known Allergies  Current Outpatient Medications on File Prior to Visit  Medication Sig  . Aspirin (STANBACK HEADACHE POWDER PO) Take by mouth.  Marland Kitchen CALCIUM-VITAMIN D PO Take 1 tablet by mouth daily.  Marland Kitchen estradiol-norethindrone (AMABELZ) 1-0.5 MG tablet Take 1 tablet by mouth daily.  Marland Kitchen GARLIC PO Take 1 tablet by mouth 2 (two) times daily.    . INVOKANA 300 MG TABS tablet TAKE 1 TABLET BY MOUTH EVERY DAY  . levothyroxine (SYNTHROID, LEVOTHROID) 100 MCG tablet TAKE 1 TABLET BY MOUTH  EVERY MORNING ON AN EMPTY  STOMACH 30 MINUTES BEFORE  BREAKFAST  . losartan-hydrochlorothiazide (HYZAAR) 100-25 MG tablet Take 1 tablet by mouth daily.  Marland Kitchen LYSINE PO Take 1 tablet by mouth at bedtime.    . metFORMIN (GLUCOPHAGE) 1000 MG tablet TAKE 1 TABLET BY MOUTH  TWICE A DAY FOR DIABETES  . Multiple Vitamins-Minerals (MULTIVITAMIN WITH MINERALS) tablet Take 1 tablet by mouth daily.    . pantoprazole (PROTONIX) 40 MG tablet Take 1 tablet (40 mg total) by mouth daily.  . temazepam (RESTORIL) 30 MG capsule Take 1 capsule (30 mg total) by mouth at bedtime.  . valACYclovir (VALTREX) 500 MG tablet TAKE 1 TABLET BY MOUTH TWO  TIMES  DAILY AS NEEDED   No current facility-administered medications on file prior to visit.     ROS: all negative except above.   Physical Exam:  BP 138/82   Pulse 89   Temp (!) 97.4 F (36.3 C)   Ht 4' 11.5" (1.511 m)   Wt 168 lb (76.2 kg)   SpO2 98%   BMI 33.36 kg/m   General Appearance: Well nourished, in no acute distress. Eyes: PERRLA, conjunctiva no swelling or erythema  Neck: Supple.  Respiratory: Respiratory effort normal, BS equal bilaterally without rales, rhonchi, wheezing or stridor.  Cardio: RRR with no MRGs. Brisk peripheral pulses.  Lymphatics: Non tender without lymphadenopathy.  Musculoskeletal: normal gait.  Skin: Small erythematous, faintly raised area approx 1cm x 5 cm on posterior neck at  skin fold. No distinct lesions or discharge. Not notably warm to touch. Otherwise skin is warm, dry without lesions, ecchymosis.   Psych: Awake and oriented X 3, anxious affect, easily reassured, Insight and Judgment appropriate.     Izora Ribas, NP 5:26 PM [facility]

## 2017-05-13 NOTE — Patient Instructions (Signed)
  Keep area clean and exposed to air, avoid purfumes and scented lotions for a while  Avoid any irritating contact, rise off sweat quickly  Apply triamcinolone/kenalog twice daily   Rash A rash is a change in the color of the skin. A rash can also change the way your skin feels. There are many different conditions and factors that can cause a rash. Follow these instructions at home: Pay attention to any changes in your symptoms. Follow these instructions to help with your condition: Medicine Take or apply over-the-counter and prescription medicines only as told by your health care provider. These may include:  Corticosteroid cream.  Anti-itch lotions.  Oral antihistamines.  Skin Care  Apply cool compresses to the affected areas.  Try taking a bath with: ? Epsom salts. Follow the instructions on the packaging. You can get these at your local pharmacy or grocery store. ? Baking soda. Pour a small amount into the bath as told by your health care provider. ? Colloidal oatmeal. Follow the instructions on the packaging. You can get this at your local pharmacy or grocery store.  Try applying baking soda paste to your skin. Stir water into baking soda until it reaches a paste-like consistency.  Do not scratch or rub your skin.  Avoid covering the rash. Make sure the rash is exposed to air as much as possible. General instructions  Avoid hot showers or baths, which can make itching worse. A cold shower may help.  Avoid scented soaps, detergents, and perfumes. Use gentle soaps, detergents, perfumes, and other cosmetic products.  Avoid any substance that causes your rash. Keep a journal to help track what causes your rash. Write down: ? What you eat. ? What cosmetic products you use. ? What you drink. ? What you wear. This includes jewelry.  Keep all follow-up visits as told by your health care provider. This is important. Contact a health care provider if:  You sweat at  night.  You lose weight.  You urinate more than normal.  You feel weak.  You vomit.  Your skin or the whites of your eyes look yellow (jaundice).  Your skin: ? Tingles. ? Is numb.  Your rash: ? Does not go away after several days. ? Gets worse.  You are: ? Unusually thirsty. ? More tired than normal.  You have: ? New symptoms. ? Pain in your abdomen. ? A fever. ? Diarrhea. Get help right away if:  You develop a rash that covers all or most of your body. The rash may or may not be painful.  You develop blisters that: ? Are on top of the rash. ? Grow larger or grow together. ? Are painful. ? Are inside your nose or mouth.  You develop a rash that: ? Looks like purple pinprick-sized spots all over your body. ? Has a "bull's eye" or looks like a target. ? Is not related to sun exposure, is red and painful, and causes your skin to peel. This information is not intended to replace advice given to you by your health care provider. Make sure you discuss any questions you have with your health care provider. Document Released: 04/04/2002 Document Revised: 09/18/2015 Document Reviewed: 08/30/2014 Elsevier Interactive Patient Education  2018 [employer].

## 2017-05-16 ENCOUNTER — Other Ambulatory Visit: Payer: Self-pay | Admitting: Physician Assistant

## 2017-05-19 ENCOUNTER — Encounter: Payer: Self-pay | Admitting: *Deleted

## 2017-06-02 ENCOUNTER — Other Ambulatory Visit: Payer: Self-pay | Admitting: Internal Medicine

## 2017-06-03 ENCOUNTER — Other Ambulatory Visit: Payer: Self-pay | Admitting: Physician Assistant

## 2017-06-03 MED ORDER — VALACYCLOVIR HCL 500 MG PO TABS: ORAL_TABLET | ORAL | 0 refills | Status: DC

## 2017-06-12 ENCOUNTER — Telehealth: Payer: Self-pay | Admitting: Physician Assistant

## 2017-06-12 MED ORDER — PREDNISONE 20 MG PO TABS: ORAL_TABLET | ORAL | 0 refills | Status: DC

## 2017-06-12 MED ORDER — PROMETHAZINE-DM 6.25-15 MG/5ML PO SYRP: 5.0000 mL | ORAL_SOLUTION | Freq: Four times a day (QID) | ORAL | 1 refills | Status: DC | PRN

## 2017-06-12 NOTE — Telephone Encounter (Signed)
Patient calling with cold symptoms x 2 days. She is on cough drops, mucinex, alk cold plus. She has sore throat, cough with yellow mucus.   Problem list has Diverticulosis; Hypothyroid; Irritable bowel syndrome (IBS); Hyperlipidemia; PCOS (polycystic ovarian syndrome); Migraine; Arthritis; Prediabetes; Essential hypertension; Vitamin D deficiency; Medication management; Obesity; CN (constipation); and Endometriosis on their problem list.  Medications Current Outpatient Medications on File Prior to Visit  Medication Sig  . Aspirin (STANBACK HEADACHE POWDER PO) Take by mouth.  Marland Kitchen CALCIUM-VITAMIN D PO Take 1 tablet by mouth daily.  Marland Kitchen estradiol-norethindrone (AMABELZ) 1-0.5 MG tablet Take 1 tablet by mouth daily.  Marland Kitchen GARLIC PO Take 1 tablet by mouth 2 (two) times daily.    . INVOKANA 300 MG TABS tablet TAKE 1 TABLET BY MOUTH EVERY DAY  . levothyroxine (SYNTHROID, LEVOTHROID) 100 MCG tablet TAKE 1 TABLET BY MOUTH  EVERY MORNING ON AN EMPTY  STOMACH 30 MINUTES BEFORE  BREAKFAST  . losartan-hydrochlorothiazide (HYZAAR) 100-25 MG tablet TAKE 1 TABLET BY MOUTH  DAILY  . LYSINE PO Take 1 tablet by mouth at bedtime.    . metFORMIN (GLUCOPHAGE) 1000 MG tablet TAKE 1 TABLET BY MOUTH  TWICE A DAY FOR DIABETES  . Multiple Vitamins-Minerals (MULTIVITAMIN WITH MINERALS) tablet Take 1 tablet by mouth daily.    . pantoprazole (PROTONIX) 40 MG tablet Take 1 tablet (40 mg total) by mouth daily.  . temazepam (RESTORIL) 30 MG capsule Take 1 capsule (30 mg total) by mouth at bedtime.  . triamcinolone ointment (KENALOG) 0.1 % Apply 1 application topically 2 (two) times daily.  . valACYclovir (VALTREX) 500 MG tablet TAKE 1 TABLET BY MOUTH TWO  TIMES DAILY AS NEEDED   No current facility-administered medications on file prior to visit.     No Known Allergies  Since it has only been 2 days, we do not want to send in an antibiotic yet. We want to wait 7-10 days for you body to fight off an infection like a virus. We  can treat the inflammation which will treat the symptoms. If you are not better after 7-10 days we will send in an antibiotic for you.   If you are not getting better call the office for an office visit. If you are getting worse go to the ER.

## 2017-07-02 ENCOUNTER — Ambulatory Visit: Payer: 59 | Admitting: Physician Assistant

## 2017-07-02 ENCOUNTER — Encounter: Payer: Self-pay | Admitting: Physician Assistant

## 2017-07-02 ENCOUNTER — Other Ambulatory Visit: Payer: Self-pay

## 2017-07-02 VITALS — BP 131/83 | HR 113 | Temp 98.5°F | Resp 18 | Ht 59.92 in | Wt 170.6 lb

## 2017-07-02 DIAGNOSIS — H6983 Other specified disorders of Eustachian tube, bilateral: Secondary | ICD-10-CM

## 2017-07-02 DIAGNOSIS — R609 Edema, unspecified: Secondary | ICD-10-CM

## 2017-07-02 DIAGNOSIS — J029 Acute pharyngitis, unspecified: Secondary | ICD-10-CM

## 2017-07-02 DIAGNOSIS — H6121 Impacted cerumen, right ear: Secondary | ICD-10-CM | POA: Diagnosis not present

## 2017-07-02 DIAGNOSIS — H938X3 Other specified disorders of ear, bilateral: Secondary | ICD-10-CM

## 2017-07-02 DIAGNOSIS — R0982 Postnasal drip: Secondary | ICD-10-CM | POA: Diagnosis not present

## 2017-07-02 LAB — POCT RAPID STREP A (OFFICE): Rapid Strep A Screen: NEGATIVE

## 2017-07-02 MED ORDER — BENZOCAINE-MENTHOL 15-3.6 MG MT LOZG: 1.0000 | LOZENGE | OROMUCOSAL | 0 refills | Status: DC | PRN

## 2017-07-02 MED ORDER — FLUTICASONE PROPIONATE 50 MCG/ACT NA SUSP: 2.0000 | Freq: Every day | NASAL | 6 refills | Status: DC

## 2017-07-02 NOTE — Progress Notes (Addendum)
MRN: [id number] DOB: [birth year]  Subjective:   Kelly Goodwin is a 57 y.o. female presenting for chief complaint of Sore Throat (2-3 days- pt states that her both ears also hurt) .  Reports 3 day history of sore throat and ear fullness. Has associated post nasal drip, worse at night.  Ear fullness is worse when she is driving back and forth from [place] as she does so every weekend. Has tried warm salt water, honey/lemon tea, mucinex, zyrtec, and alka selzter with no full relief. Denies fever, sinus pain,  wheezing, shortness of breath, chest pain and cough, night sweats, fatigue, nausea, vomiting, abdominal pain and diarrhea. Has not had sick contact with anyone.  Has history of seasonal allergies, no history of asthma. Patient has had flu shot this season. Denies smoking. Denies any other aggravating or relieving factors, no other questions or concerns.  Kelly Goodwin has a current medication list which includes the following prescription(s): aspirin, calcium-vitamin d, estradiol-norethindrone, garlic, invokana, levothyroxine, losartan-hydrochlorothiazide, lysine, metformin, multivitamin with minerals, pantoprazole, temazepam, valacyclovir, prednisone, promethazine-dextromethorphan, and triamcinolone ointment. Also has No Known Allergies.  Kelly Goodwin  has a past medical history of Arthritis, Constipation, chronic, Diverticulitis, Diverticulosis, Fibroid uterus, History of gallstones, Hyperlipidemia, Hypertension, Hypothyroidism, Migraine, PCOS (polycystic ovarian syndrome), Prediabetes, Thyroid disease, and Type II or unspecified type diabetes mellitus without mention of complication, not stated as uncontrolled. Also  has a past surgical history that includes Breast reduction surgery (1988); Cholecystectomy (1995); Tonsillectomy and adenoidectomy (1990's); LASIK (Left); Refractive surgery (Left); Breast surgery; and Eye surgery.   Objective:   Vitals: BP 131/83 (BP Location: Right Arm, Patient Position:  Sitting, Cuff Size: Large)   Pulse (!) 113   Temp 98.5 F (36.9 C) (Oral)   Resp 18   Ht 4' 11.92" (1.522 m)   Wt 170 lb 9.6 oz (77.4 kg)   SpO2 98%   BMI 33.41 kg/m   Physical Exam  Constitutional: She is oriented to person, place, and time. She appears well-developed and well-nourished.  HENT:  Head: Normocephalic and atraumatic.  Right Ear: External ear normal. There is drainage (dark brown cerumen blocking view of TM). No tenderness. No mastoid tenderness.  Left Ear: External ear and ear canal normal. No tenderness. Tympanic membrane is not erythematous and not bulging. A middle ear effusion is present.  Nose: Mucosal edema (mild) present. No rhinorrhea. Right sinus exhibits no maxillary sinus tenderness and no frontal sinus tenderness. Left sinus exhibits no maxillary sinus tenderness and no frontal sinus tenderness.  Mouth/Throat: Mucous membranes are normal. Posterior oropharyngeal erythema (cobbestoning noted) present. No posterior oropharyngeal edema or tonsillar abscesses. Tonsils are 1+ on the right. Tonsils are 1+ on the left. No tonsillar exudate.  No uvula noted.   Eyes: Conjunctivae are normal.  Neck: Normal range of motion.  Pulmonary/Chest: Effort normal.  Lymphadenopathy:       Head (right side): No submental, no submandibular, no tonsillar, no preauricular, no posterior auricular and no occipital adenopathy present.       Head (left side): No submental, no submandibular, no tonsillar, no preauricular, no posterior auricular and no occipital adenopathy present.    She has no cervical adenopathy.       Right: No supraclavicular adenopathy present.       Left: No supraclavicular adenopathy present.  Neurological: She is alert and oriented to person, place, and time.  Skin: Skin is warm and dry.  Psychiatric: She has a normal mood and affect.  Vitals reviewed.   Results  for orders placed or performed in visit on 07/02/17 (from the past 24 hour(s))  POCT rapid strep  A     Status: Normal   Collection Time: 07/02/17  3:44 PM  Result Value Ref Range   Rapid Strep A Screen Negative Negative   Post ear lavage, canals clear.  TM with mild injection, no bulging or purulent drainage noted.  Mild middle ear effusion noted. Assessment and Plan :  1. Sore throat - POCT rapid strep A - Culture, Group A Strep 2. Impacted cerumen of right ear Resolved - Ear wax removal 3. Mucosal edema 4. Sensation of fullness in both ears 5. Post-nasal drip 6. Acute pharyngitis, unspecified etiology Rapid strep test negative.  Culture pending. Patient is overall well-appearing, no distress.  Vitals stable.  Use over-the-counter Tylenol or ibuprofen as prescribed as needed for pain.  Likely viral etiology.  Recommend symptomatic treatment at this time.  Recommended light meals. Advised to return to clinic if symptoms worsen, do not improve, or as needed.  - Benzocaine-Menthol (CEPACOL SORE THROAT) 15-3.6 MG LOZG; Use as directed 1 lozenge in the mouth or throat every 2 (two) hours as needed.  Dispense: 16 each; Refill: 0  7. Dysfunction of both eustachian tubes Physical exam findings consistent with ETD.  Recommended continuing with oral antihistamine along with Flonase daily. - fluticasone (FLONASE) 50 MCG/ACT nasal spray; Place 2 sprays into both nostrils daily.  Dispense: 16 g; Refill: [place], PA-C  [facility] 07/02/2017 3:46 PM

## 2017-07-02 NOTE — Patient Instructions (Addendum)
- We will treat this as a viral infection at this time. Your strep culture is pending.  - I recommend you rest, drink plenty of fluids, eat light meals including soups. I recommend drinking things such as ginger ale or sprite. You may also add ginger root to your warm tea.  - You may also use Tylenol or ibuprofen over-the-counter for your sore throat.  - I also recommend using nasal spray and oral antihistamine. You can also use lozenges.  - Please let me know if you are not seeing any improvement or get worse in 5-7 days.  Sore Throat When you have a sore throat, your throat may:  Hurt.  Burn.  Feel irritated.  Feel scratchy.  Many things can cause a sore throat, including:  An infection.  Allergies.  Dryness in the air.  Smoke or pollution.  Gastroesophageal reflux disease (GERD).  A tumor.  A sore throat can be the first sign of another sickness. It can happen with other problems, like coughing or a fever. Most sore throats go away without treatment. Follow these instructions at home:  Take over-the-counter medicines only as told by your doctor.  Drink enough fluids to keep your pee (urine) clear or pale yellow.  Rest when you feel you need to.  To help with pain, try: ? Sipping warm liquids, such as broth, herbal tea, or warm water. ? Eating or drinking cold or frozen liquids, such as frozen ice pops. ? Gargling with a salt-water mixture 3-4 times a day or as needed. To make a salt-water mixture, add -1 tsp of salt in 1 cup of warm water. Mix it until you cannot see the salt anymore. ? Sucking on hard candy or throat lozenges. ? Putting a cool-mist humidifier in your bedroom at night. ? Sitting in the bathroom with the door closed for 5-10 minutes while you run hot water in the shower.  Do not use any tobacco products, such as cigarettes, chewing tobacco, and e-cigarettes. If you need help quitting, ask your doctor. Contact a doctor if:  You have a fever for  more than 2-3 days.  You keep having symptoms for more than 2-3 days.  Your throat does not get better in 7 days.  You have a fever and your symptoms suddenly get worse. Get help right away if:  You have trouble breathing.  You cannot swallow fluids, soft foods, or your saliva.  You have swelling in your throat or neck that gets worse.  You keep feeling like you are going to throw up (vomit).  You keep throwing up. This information is not intended to replace advice given to you by your health care provider. Make sure you discuss any questions you have with your health care provider. Document Released: 01/22/2008 Document Revised: 12/09/2015 Document Reviewed: 02/02/2015 Elsevier Interactive Patient Education  2018 [employer].     IF you received an x-ray today, you will receive an invoice from [facility]. Please contact [facility] at [phone] with questions or concerns regarding your invoice.   IF you received labwork today, you will receive an invoice from [facility]. Please contact [facility] at [phone] with questions or concerns regarding your invoice.   Our billing staff will not be able to assist you with questions regarding bills from these companies.  You will be contacted with the lab results as soon as they are available. The fastest way to get your results is to activate your My Chart account. Instructions are located on the  last page of this paperwork. If you have not heard from [place] regarding the results in 2 weeks, please contact this office.

## 2017-07-03 ENCOUNTER — Telehealth: Payer: Self-pay | Admitting: Physician Assistant

## 2017-07-03 NOTE — Telephone Encounter (Signed)
Please advise. Dgaddy, CMA 

## 2017-07-03 NOTE — Telephone Encounter (Signed)
Copied from [place] [phone]. Topic: Quick Communication - See Telephone Encounter >> Jul 03, 2017  4:10 PM Burnis Medin, NT wrote: CRM for notification. See Telephone encounter for: Patient called and said she was seen in the office yesterday for a sinus infection and was told to call back if she is not feeling any better. Pt wanted to see if the doctor could call her in an antibiotic. Also patient said she is supposed to have  Benzocaine-Menthol (CEPACOL SORE THROAT) 15-3.6 MG LOZG. Pt uses [facility] [place], [place] [phone] (Phone) [phone] (Fax)

## 2017-07-03 NOTE — Telephone Encounter (Signed)
Patient's call was returned about the antibiotic request and the cepacol lozenges. She said "I have a lot of drainage now down my throat and I feel like it is more of a sinus infection than strep throat. I have some tenderness to my sinuses and I've had plenty of sinus infections to know this is what it is." I advised that the cepacol was sent to the pharmacy, she says "yes, they said they ignored the prescription because this is something that can be bought OTC." I advised this request for an antibiotic will be sent to [place], she says "ok, and ask her to send in something for a yeast infection along with the antibiotic."

## 2017-07-04 ENCOUNTER — Other Ambulatory Visit: Payer: Self-pay | Admitting: Physician Assistant

## 2017-07-04 LAB — CULTURE, GROUP A STREP: STREP A CULTURE: NEGATIVE

## 2017-07-07 ENCOUNTER — Encounter: Payer: Self-pay | Admitting: *Deleted

## 2017-07-07 NOTE — Telephone Encounter (Signed)
Can you please call pt and ask how she is doing now? I sent her a message via mychart explaining how her strep test was negative and she should continue with symptomatic relief. If she is still having issues, please have her make a follow up appointment with me. Thanks!

## 2017-07-08 NOTE — Telephone Encounter (Signed)
Phone call to patient. She states she is feeling much better. Her throat is no longer painful. She states she is mostly having nasal congestion now, is using flonase for this. Will look at message on MyChart from [place] and if she needs follow up visit will call back to make one.

## 2017-07-21 ENCOUNTER — Other Ambulatory Visit: Payer: Self-pay | Admitting: Physician Assistant

## 2017-07-26 ENCOUNTER — Other Ambulatory Visit: Payer: Self-pay | Admitting: Physician Assistant

## 2017-07-26 ENCOUNTER — Other Ambulatory Visit: Payer: Self-pay | Admitting: Internal Medicine

## 2017-07-27 ENCOUNTER — Other Ambulatory Visit: Payer: Self-pay | Admitting: Physician Assistant

## 2017-08-07 ENCOUNTER — Other Ambulatory Visit: Payer: Self-pay | Admitting: Internal Medicine

## 2017-08-11 ENCOUNTER — Encounter: Payer: Self-pay | Admitting: Physician Assistant

## 2017-08-11 ENCOUNTER — Ambulatory Visit (INDEPENDENT_AMBULATORY_CARE_PROVIDER_SITE_OTHER): Payer: 59 | Admitting: Physician Assistant

## 2017-08-11 VITALS — BP 124/80 | HR 99 | Temp 97.8°F | Resp 16 | Wt 178.8 lb

## 2017-08-11 DIAGNOSIS — E782 Mixed hyperlipidemia: Secondary | ICD-10-CM

## 2017-08-11 DIAGNOSIS — Z79899 Other long term (current) drug therapy: Secondary | ICD-10-CM | POA: Diagnosis not present

## 2017-08-11 DIAGNOSIS — R7303 Prediabetes: Secondary | ICD-10-CM

## 2017-08-11 DIAGNOSIS — E039 Hypothyroidism, unspecified: Secondary | ICD-10-CM | POA: Diagnosis not present

## 2017-08-11 DIAGNOSIS — I10 Essential (primary) hypertension: Secondary | ICD-10-CM

## 2017-08-11 MED ORDER — TEMAZEPAM 30 MG PO CAPS: ORAL_CAPSULE | ORAL | 3 refills | Status: DC

## 2017-08-11 MED ORDER — TOPIRAMATE 50 MG PO TABS: ORAL_TABLET | ORAL | 3 refills | Status: DC

## 2017-08-11 MED ORDER — PHENTERMINE HCL 37.5 MG PO TABS: ORAL_TABLET | ORAL | 2 refills | Status: DC

## 2017-08-11 NOTE — Progress Notes (Signed)
Assessment and Plan:  Essential hypertension - continue medications, DASH diet, exercise and monitor at home. Call if greater than 130/80.   Class 1 obesity due to excess calories with serious comorbidity and body mass index (BMI) of 32.0 to 32.9 in adult Discussed disease progression and risks Discussed diet/exercise, weight management and risk modification Bring in journal food next visit- she is doing it but did not bring it in Exercising 4-5 days a week - insurance will not cover weight loss meds- will start on phentermine and add on topamax at night  Hypothyroidism, unspecified type Hypothyroidism-check TSH level, continue medications the same, reminded to take on an empty stomach 30-59mins before food.   Mixed hyperlipidemia -continue medications, check lipids, decrease fatty foods, increase activity.   Prediabetes  With weight gain will check A1C    Future Appointments  Date Time Provider [place]  01/11/2018  3:00 PM Vicie Mutters, PA-C [id number] None     HPI 57 y.o.female presents for 3 month follow up.    Her blood pressure has been controlled at home, today their BP is BP: 124/80  She does not workout. She denies chest pain, shortness of breath, dizziness.  She is on cholesterol medication and denies myalgias. Her cholesterol is at goal. The cholesterol last visit was:   Lab Results  Component Value Date   CHOL 197 12/25/2016   HDL 55 12/25/2016   LDLCALC 122 (H) 12/25/2016   TRIG 102 12/25/2016   CHOLHDL 3.6 12/25/2016    She has been working on diet and exercise for diabetes but with diet and invokana she is not in the preDM range even, she is on metformin for PCOS and denies paresthesia of the feet, polydipsia, polyuria and visual disturbances. Last A1C in the office was:  Lab Results  Component Value Date   HGBA1C 5.5 12/25/2016   Patient is on Vitamin D supplement.   Lab Results  Component Value Date   VD25OH 77 12/25/2016    BMI is Body  mass index is 35.01 kg/m., she is working on diet and exercise. Could not tolerate belviq due to HA, insurance would not cover contrave or qysmia.  Wt Readings from Last 3 Encounters:  08/11/17 178 lb 12.8 oz (81.1 kg)  07/02/17 170 lb 9.6 oz (77.4 kg)  05/13/17 168 lb (76.2 kg)    Past Medical History:  Diagnosis Date  . Arthritis   . Constipation, chronic   . Diverticulitis   . Diverticulosis   . Fibroid uterus   . History of gallstones   . Hyperlipidemia   . Hypertension   . Hypothyroidism   . Migraine   . PCOS (polycystic ovarian syndrome)    takes metformin for this  . Prediabetes   . Thyroid disease   . Type II or unspecified type diabetes mellitus without mention of complication, not stated as uncontrolled      No Known Allergies    Current Outpatient Medications on File Prior to Visit  Medication Sig Dispense Refill  . AMABELZ 1-0.5 MG tablet TAKE 1 TABLET BY MOUTH  DAILY 84 tablet 1  . Aspirin (STANBACK HEADACHE POWDER PO) Take by mouth.    Marland Kitchen CALCIUM-VITAMIN D PO Take 1 tablet by mouth daily.    Marland Kitchen GARLIC PO Take 1 tablet by mouth 2 (two) times daily.      . INVOKANA 300 MG TABS tablet TAKE 1 TABLET BY MOUTH EVERY DAY 90 tablet 0  . levothyroxine (SYNTHROID, LEVOTHROID) 100 MCG tablet TAKE  1 TABLET BY MOUTH  EVERY MORNING ON AN EMPTY  STOMACH 30 MINUTES BEFORE  BREAKFAST 90 tablet 0  . losartan-hydrochlorothiazide (HYZAAR) 100-25 MG tablet TAKE 1 TABLET BY MOUTH  DAILY 90 tablet 0  . LYSINE PO Take 1 tablet by mouth at bedtime.      . metFORMIN (GLUCOPHAGE) 1000 MG tablet TAKE 1 TABLET BY MOUTH  TWICE A DAY FOR DIABETES 180 tablet 1  . Multiple Vitamins-Minerals (MULTIVITAMIN WITH MINERALS) tablet Take 1 tablet by mouth daily.      . pantoprazole (PROTONIX) 40 MG tablet Take 1 tablet (40 mg total) by mouth daily. 90 tablet 1  . temazepam (RESTORIL) 30 MG capsule Take 1 capsule at hour of sleep ONLY if needed for insomnia & please try to limit to 5 days per week to  avoid addiction 30 capsule 0  . valACYclovir (VALTREX) 500 MG tablet TAKE 1 TABLET BY MOUTH TWO  TIMES DAILY AS NEEDED 180 tablet 0   No current facility-administered medications on file prior to visit.     ROS: all negative except above.   Physical Exam: Filed Weights   08/11/17 1501  Weight: 178 lb 12.8 oz (81.1 kg)   BP 124/80   Pulse 99   Temp 97.8 F (36.6 C)   Resp 16   Wt 178 lb 12.8 oz (81.1 kg)   SpO2 98%   BMI 35.01 kg/m  General Appearance: Well nourished, in no apparent distress. Eyes: PERRLA, EOMs, conjunctiva no swelling or erythema Sinuses: No Frontal/maxillary tenderness ENT/Mouth: Ext aud canals clear, TMs without erythema, bulging. No erythema, swelling, or exudate on post pharynx.  Tonsils not swollen or erythematous. Hearing normal.  Neck: Supple, thyroid normal.  Respiratory: Respiratory effort normal, BS equal bilaterally without rales, rhonchi, wheezing or stridor.  Cardio: RRR with no MRGs. Brisk peripheral pulses without edema.  Abdomen: Soft, + BS.  Non tender, no guarding, rebound, hernias, masses. Lymphatics: Non tender without lymphadenopathy.  Musculoskeletal: Full ROM, 5/5 strength, normal gait.  Skin: Warm, dry without rashes, lesions, ecchymosis.  Neuro: Cranial nerves intact. Normal muscle tone, no cerebellar symptoms. Sensation intact.  Psych: Awake and oriented X 3, normal affect, tearful, Insight and Judgment appropriate.     Vicie Mutters, PA-C 3:07 PM [facility]

## 2017-08-11 NOTE — Patient Instructions (Addendum)
Try the phentermine in the morning start off 1/2 pill See more information below After you have been on this for 2 weeks Can add on topamax at night 1/2 pill, can increase to 1 pill after 2 weeks.   If you have any side effects or any issues message.   Check out  Mini habits for weight loss book  2 apps for tracking food is myfitness pal  loseit OR can take picture of your food  Bring in your food journal  Being dehydrated can hurt your kidneys, cause fatigue, headaches, muscle aches, joint pain, and dry skin/nails so please increase your fluids.   Drink 80-100 oz a day of water, measure it out!  Phentermine  While taking the medication we may ask that you come into the office once a month for the first month for a blood pressure check and EKG.   Also please bring in a food log for that visit to review. It is helpful if you bring in a food diary or use an app on your phone such as myfitnesspal to record your calorie intake, especially in the beginning. BRING FOR YOUR FIRST VISIT.  After that first initial visit, we will want to see you once every 2-3 months to monitor your weight, blood pressure, and heart rate.   In addition we can help answer your questions about diet, exercise, and help you every step of the way with your weight loss journey.  You can start out on 1/3 to 1/2 a pill in the morning and if you are tolerating it well you can increase to one pill daily. I also have some patients that take 1/3 or 1/2 at lunch to help prevent night time eating.  This medication is cheapest CASH pay at [place] is 14-17 dollars and you do NOT need a membership to get meds from there.   It causes dry mouth and constipation in almost every patient, so try to get 80-100 oz of water a day and increase fiber such as veggies. You can add on a stool softener if you would like.   It can give you energy however it can also cause some people to be shaky, anxious or have  palpitations. Stop this medication if that happens and contact the office.   If this medication does not work for you there are several medications that we can try to help rewire your brain in addition to making healthier habits.   What is this medicine? PHENTERMINE (FEN ter meen) decreases your appetite. This medicine is intended to be used in addition to a healthy reduced calorie diet and exercise. The best results are achieved this way. This medicine is only indicated for short-term use. Eventually your weight loss may level out and the medication will no longer be needed.   How should I use this medicine? Take this medicine by mouth. Follow the directions on the prescription label. The tablets should stay in the bottle until immediately before you take your dose. Take your doses at regular intervals. Do not take your medicine more often than directed.  Overdosage: If you think you have taken too much of this medicine contact a poison control center or emergency room at once. NOTE: This medicine is only for you. Do not share this medicine with others.  What if I miss a dose? If you miss a dose, take it as soon as you can. If it is almost time for your next dose, take only  that dose. Do not take double or extra doses. Do not increase or in any way change your dose without consulting your doctor.  What should I watch for while using this medicine? Notify your physician immediately if you become short of breath while doing your normal activities. Do not take this medicine within 6 hours of bedtime. It can keep you from getting to sleep. Avoid drinks that contain caffeine and try to stick to a regular bedtime every night. Do not stand or sit up quickly, especially if you are an older patient. This reduces the risk of dizzy or fainting spells. Avoid alcoholic drinks.  What side effects may I notice from receiving this medicine? Side effects that you should report to your doctor or health care  professional as soon as possible: -chest pain, palpitations -depression or severe changes in mood -increased blood pressure -irritability -nervousness or restlessness -severe dizziness -shortness of breath -problems urinating -unusual swelling of the legs -vomiting  Side effects that usually do not require medical attention (report to your doctor or health care professional if they continue or are bothersome): -blurred vision or other eye problems -changes in sexual ability or desire -constipation or diarrhea -difficulty sleeping -dry mouth or unpleasant taste -headache -nausea This list may not describe all possible side effects. Call your doctor for medical advice about side effects. You may report side effects to FDA at 1-800-FDA-1088.       When it comes to diets, agreement about the perfect plan isn't easy to find, even among the experts. Experts at the [employer] developed an idea known as the Healthy Eating Plate. Just imagine a plate divided into logical, healthy portions.  The emphasis is on diet quality:  Load up on vegetables and fruits - one-half of your plate: Aim for color and variety, and remember that potatoes don't count.  Go for whole grains - one-quarter of your plate: Whole wheat, barley, wheat berries, quinoa, oats, brown rice, and foods made with them. If you want pasta, go with whole wheat pasta.  Protein power - one-quarter of your plate: Fish, chicken, beans, and nuts are all healthy, versatile protein sources. Limit red meat.  The diet, however, does go beyond the plate, offering a few other suggestions.  Use healthy plant oils, such as olive, canola, soy, corn, sunflower and peanut. Check the labels, and avoid partially hydrogenated oil, which have unhealthy trans fats.  If you're thirsty, drink water. Coffee and tea are good in moderation, but skip sugary drinks and limit milk and dairy products to one or two daily  servings.  The type of carbohydrate in the diet is more important than the amount. Some sources of carbohydrates, such as vegetables, fruits, whole grains, and beans-are healthier than others.  Finally, stay active.  Veggies are great because you can eat a ton! They are low in calories, great to fill you up, and have a ton of vitamins, minerals, and protein.

## 2017-08-12 LAB — BASIC METABOLIC PANEL WITH GFR
BUN: 12 mg/dL (ref 7–25)
CO2: 30 mmol/L (ref 20–32)
Calcium: 9.6 mg/dL (ref 8.6–10.4)
Chloride: 104 mmol/L (ref 98–110)
Creat: 0.6 mg/dL (ref 0.50–1.05)
GFR, EST AFRICAN AMERICAN: 117 mL/min/{1.73_m2} (ref >=60)
GFR, EST NON AFRICAN AMERICAN: 101 mL/min/{1.73_m2} (ref >=60)
Glucose, Bld: 84 mg/dL (ref 65–99)
POTASSIUM: 4.1 mmol/L (ref 3.5–5.3)
SODIUM: 140 mmol/L (ref 135–146)

## 2017-08-12 LAB — CBC WITH DIFFERENTIAL/PLATELET
BASOS ABS: 94 {cells}/uL (ref 0–200)
Basophils Relative: 1.1 %
EOS PCT: 3.3 %
Eosinophils Absolute: 281 cells/uL (ref 15–500)
HEMATOCRIT: 38.2 % (ref 35.0–45.0)
Hemoglobin: 12.9 g/dL (ref 11.7–15.5)
LYMPHS ABS: 3307 {cells}/uL (ref 850–3900)
MCH: 29.6 pg (ref 27.0–33.0)
MCHC: 33.8 g/dL (ref 32.0–36.0)
MCV: 87.6 fL (ref 80.0–100.0)
MPV: 9.4 fL (ref 7.5–12.5)
Monocytes Relative: 8.6 %
NEUTROS PCT: 48.1 %
Neutro Abs: 4089 cells/uL (ref 1500–7800)
Platelets: 367 10*3/uL (ref 140–400)
RBC: 4.36 10*6/uL (ref 3.80–5.10)
RDW: 13.3 % (ref 11.0–15.0)
Total Lymphocyte: 38.9 %
WBC: 8.5 10*3/uL (ref 3.8–10.8)
WBCMIX: 731 {cells}/uL (ref 200–950)

## 2017-08-12 LAB — HEPATIC FUNCTION PANEL
AG Ratio: 1.7 (calc) (ref 1.0–2.5)
ALKALINE PHOSPHATASE (APISO): 55 U/L (ref 33–130)
ALT: 11 U/L (ref 6–29)
AST: 12 U/L (ref 10–35)
Albumin: 4.1 g/dL (ref 3.6–5.1)
BILIRUBIN DIRECT: 0.1 mg/dL (ref 0.0–0.2)
BILIRUBIN TOTAL: 0.3 mg/dL (ref 0.2–1.2)
Globulin: 2.4 g/dL (calc) (ref 1.9–3.7)
Indirect Bilirubin: 0.2 mg/dL (calc) (ref 0.2–1.2)
Total Protein: 6.5 g/dL (ref 6.1–8.1)

## 2017-08-12 LAB — LIPID PANEL
Cholesterol: 187 mg/dL (ref <200)
HDL: 63 mg/dL (ref >50)
LDL Cholesterol (Calc): 106 mg/dL (calc) — ABNORMAL HIGH
NON-HDL CHOLESTEROL (CALC): 124 mg/dL (ref <130)
TRIGLYCERIDES: 87 mg/dL (ref <150)
Total CHOL/HDL Ratio: 3 (calc) (ref <5.0)

## 2017-08-12 LAB — HEMOGLOBIN A1C
HEMOGLOBIN A1C: 5.2 %{Hb} (ref <5.7)
MEAN PLASMA GLUCOSE: 103 (calc)
eAG (mmol/L): 5.7 (calc)

## 2017-08-12 LAB — TSH: TSH: 1.03 mIU/L (ref 0.40–4.50)

## 2017-10-02 ENCOUNTER — Other Ambulatory Visit: Payer: Self-pay | Admitting: Physician Assistant

## 2017-10-04 ENCOUNTER — Other Ambulatory Visit: Payer: Self-pay | Admitting: Adult Health

## 2017-10-04 ENCOUNTER — Other Ambulatory Visit: Payer: Self-pay | Admitting: Physician Assistant

## 2017-10-06 DIAGNOSIS — S0502XA Injury of conjunctiva and corneal abrasion without foreign body, left eye, initial encounter: Secondary | ICD-10-CM | POA: Diagnosis not present

## 2017-10-08 DIAGNOSIS — H16042 Marginal corneal ulcer, left eye: Secondary | ICD-10-CM | POA: Diagnosis not present

## 2017-12-09 ENCOUNTER — Other Ambulatory Visit: Payer: Self-pay | Admitting: Physician Assistant

## 2017-12-09 MED ORDER — TEMAZEPAM 30 MG PO CAPS: ORAL_CAPSULE | ORAL | 3 refills | Status: DC

## 2017-12-25 ENCOUNTER — Other Ambulatory Visit: Payer: Self-pay | Admitting: Internal Medicine

## 2018-01-07 NOTE — Progress Notes (Deleted)
Complete Physical  Assessment and Plan:  Essential hypertension - continue medications, DASH diet, exercise and monitor at home. Call if greater than 130/80.  -     CBC with Differential/Platelet -     BASIC METABOLIC PANEL WITH GFR -     Hepatic function panel -     TSH -     Urinalysis, Routine w reflex microscopic -     Microalbumin / creatinine urine ratio -     EKG 12-Lead  Hypothyroidism, unspecified type Hypothyroidism-check TSH level, continue medications the same, reminded to take on an empty stomach 30-38mins before food.  -     TSH  Mixed hyperlipidemia -continue medications, check lipids, decrease fatty foods, increase activity.  -     Lipid panel  Prediabetes Discussed general issues about diabetes pathophysiology and management., Educational material distributed., Suggested low cholesterol diet., Encouraged aerobic exercise., Discussed foot care., Reminded to get yearly retinal exam. -     Hemoglobin A1c  Vitamin D deficiency -     VITAMIN D 25 Hydroxy (Vit-D Deficiency, Fractures)  Medication management -     Magnesium  Screening, anemia, deficiency, iron -     Iron,Total/Total Iron Binding Cap -     Vitamin B12  Class 2 severe obesity due to excess calories with serious comorbidity and body mass index (BMI) of 35.0 to 35.9 in adult (HCC) -     Liraglutide -Weight Management (SAXENDA) 18 MG/3ML SOPN; Inject 3 mg into the skin daily. Inject one pen SQ daily for weight loss  Plantar Faciitis-  Conservative treatment, night time orthotics, arch support, RICE, NSAID, stretches given If not better will do injection of dexamethasone  Encounter for general adult medical examination with abnormal findings 1 year  Other migraine without status migrainosus, not intractable Avoid triggers  Irritable bowel syndrome, unspecified type If not on benefiber then add it, decrease stress,  if any worsening symptoms, blood in stool, AB pain, etc call office  Other  constipation Increase water and fiber  PCOS (polycystic ovarian syndrome) Weight loss advised  Arthritis Weight loss advised   Discussed med's effects and SE's. Screening labs and tests as requested with regular follow-up as recommended.  HPI  57 y.o. female  presents for a complete physical.  Her blood pressure has been controlled at home, today their BP is  .  She does not workout. She denies chest pain, shortness of breath, dizziness.    She is on cholesterol medication and denies myalgias. Her cholesterol is at goal. The cholesterol last visit was:  Lab Results  Component Value Date   CHOL 187 08/11/2017   HDL 63 08/11/2017   LDLCALC 106 (H) 08/11/2017   TRIG 87 08/11/2017   CHOLHDL 3.0 08/11/2017  .  She has been working on diet and exercise for prediabetes, she is on bASA, she is on ACE/ARB and denies foot ulcerations, hyperglycemia, hypoglycemia , increased appetite, nausea, paresthesia of the feet, polydipsia, polyuria, visual disturbances, vomiting and weight loss. Last A1C in the office was:  Lab Results  Component Value Date   HGBA1C 5.2 08/11/2017    Patient is on Vitamin D supplement.   Lab Results  Component Value Date   VD25OH 50 12/25/2016     She reports that she is doing weight watchers currently.  She reports that she is not doing this right now.   BMI is There is no height or weight on file to calculate BMI., she is working on diet and exercise.  1 year and 7 month since her mom died, step father died recently, increase stress. Has been working out, hurt left foot on heel, worse for sitting for a long time,better after walking.  Wt Readings from Last 3 Encounters:  08/11/17 178 lb 12.8 oz (81.1 kg)  07/02/17 170 lb 9.6 oz (77.4 kg)  05/13/17 168 lb (76.2 kg)     Current Medications:  Current Outpatient Medications on File Prior to Visit  Medication Sig Dispense Refill  . AMABELZ 1-0.5 MG tablet TAKE 1 TABLET BY MOUTH  DAILY 84 tablet 1  . Aspirin  (STANBACK HEADACHE POWDER PO) Take by mouth.    Marland Kitchen CALCIUM-VITAMIN D PO Take 1 tablet by mouth daily.    Marland Kitchen GARLIC PO Take 1 tablet by mouth 2 (two) times daily.      . INVOKANA 300 MG TABS tablet TAKE 1 TABLET BY MOUTH EVERY DAY 90 tablet 0  . levothyroxine (SYNTHROID, LEVOTHROID) 100 MCG tablet TAKE 1 TABLET BY MOUTH  EVERY MORNING ON AN EMPTY  STOMACH 30 MINUTES BEFORE  BREAKFAST 90 tablet 0  . losartan-hydrochlorothiazide (HYZAAR) 100-25 MG tablet TAKE 1 TABLET BY MOUTH  DAILY 90 tablet 0  . LYSINE PO Take 1 tablet by mouth at bedtime.      . metFORMIN (GLUCOPHAGE) 1000 MG tablet TAKE 1 TABLET BY MOUTH  TWICE A DAY FOR DIABETES 180 tablet 1  . Multiple Vitamins-Minerals (MULTIVITAMIN WITH MINERALS) tablet Take 1 tablet by mouth daily.      . pantoprazole (PROTONIX) 40 MG tablet TAKE 1 TABLET BY MOUTH  DAILY 90 tablet 1  . phentermine (ADIPEX-P) 37.5 MG tablet 1/2-1 tablet in the morning- will cause dry mouth/consitpation, increase water 30 tablet 2  . temazepam (RESTORIL) 30 MG capsule Take 1 capsule at hour of sleep ONLY if needed for insomnia & please try to limit to 5 days per week to avoid addiction 30 capsule 3  . topiramate (TOPAMAX) 50 MG tablet 1/2-1 tablet at night with dinner 30 tablet 3  . valACYclovir (VALTREX) 500 MG tablet TAKE 1 TABLET BY MOUTH TWO  TIMES DAILY AS NEEDED 180 tablet 0   No current facility-administered medications on file prior to visit.     Health Maintenance:   Immunization History  Administered Date(s) Administered  . Influenza,inj,quad, With Preservative 03/18/2016  . Influenza-Unspecified 02/26/2015  . Tdap 07/08/2011  . Zoster 03/21/2014    Tetanus: 2013 Flu vaccine: 2017 Zostavax: 2015 Pap: 2017 MGM:03/2016  DEXA: 2013 Colonoscopy: 2012 Last Eye Exam:  Dr. Gershon Crane 2017  Patient Care Team: Vicie Mutters, PA-C as PCP - General (Physician Assistant) Lafayette Dragon, MD (Inactive) as Consulting Physician (Gastroenterology) Servando Salina, MD as Consulting Physician (Obstetrics and Gynecology)  Allergies: No Known Allergies  Medical History:  Past Medical History:  Diagnosis Date  . Arthritis   . Constipation, chronic   . Diverticulitis   . Diverticulosis   . Fibroid uterus   . History of gallstones   . Hyperlipidemia   . Hypertension   . Hypothyroidism   . Migraine   . PCOS (polycystic ovarian syndrome)    takes metformin for this  . Prediabetes   . Thyroid disease   . Type II or unspecified type diabetes mellitus without mention of complication, not stated as uncontrolled     Surgical History:  Past Surgical History:  Procedure Laterality Date  . BREAST REDUCTION SURGERY  1988  . BREAST SURGERY    . CHOLECYSTECTOMY  1995  .  EYE SURGERY    . LASIK Left   . REFRACTIVE SURGERY Left   . TONSILLECTOMY AND ADENOIDECTOMY  1990's    Family History:  Family History  Problem Relation Age of Onset  . Diabetes Mother   . Lung cancer Mother   . COPD Mother   . Diverticulitis Mother   . Leukemia Mother   . Heart disease Mother   . Diabetes Sister   . Colon cancer Neg Hx     Social History:  Social History   Tobacco Use  . Smoking status: Never Smoker  . Smokeless tobacco: Never Used  Substance Use Topics  . Alcohol use: Yes    Comment: rarely  . Drug use: No    Review of Systems: Review of Systems  Constitutional: Negative for chills, fever and malaise/fatigue.  HENT: Negative for congestion, ear pain and sore throat.   Eyes: Negative.   Respiratory: Negative for cough, shortness of breath and wheezing.   Cardiovascular: Negative for chest pain, palpitations and leg swelling.  Gastrointestinal: Negative for abdominal pain, blood in stool, constipation, diarrhea, heartburn and melena.  Genitourinary: Negative.   Skin: Negative.   Neurological: Negative for dizziness, sensory change, loss of consciousness and headaches.  Psychiatric/Behavioral: Negative for depression. The patient  is not nervous/anxious and does not have insomnia.     Physical Exam: Estimated body mass index is 35.01 kg/m as calculated from the following:   Height as of 07/02/17: 4' 11.92" (1.522 m).   Weight as of 08/11/17: 178 lb 12.8 oz (81.1 kg). There were no vitals taken for this visit.  General Appearance: Well nourished well developed, in no apparent distress.  Eyes: PERRLA, EOMs, conjunctiva no swelling or erythema ENT/Mouth: Ear canals normal without obstruction, swelling, erythema, or discharge.  TMs normal bilaterally with no erythema, bulging, retraction, or loss of landmark.  Oropharynx moist and clear with no exudate, erythema, or swelling.   Neck: Supple, thyroid normal. No bruits.  No cervical adenopathy Respiratory: Respiratory effort normal, Breath sounds clear A&P without wheeze, rhonchi, rales.   Cardio: RRR without murmurs, rubs or gallops. Brisk peripheral pulses without edema.  Chest: symmetric, with normal excursions Breasts: Deferred to obgyn Abdomen: Soft, nontender, no guarding, rebound, hernias, masses, or organomegaly.  Lymphatics: Non tender without lymphadenopathy.  Musculoskeletal: Full ROM all peripheral extremities,5/5 strength, and normal gait.  Skin: Warm, dry without rashes, lesions, ecchymosis. Neuro: Awake and oriented X 3, Cranial nerves intact, reflexes equal bilaterally. Normal muscle tone, no cerebellar symptoms. Sensation intact.  Psych:  normal affect, Insight and Judgment appropriate.   EKG: WNL sinus tachy no ST changes.  Over 40 minutes of exam, counseling, chart review and critical decision making was performed  Vicie Mutters 2:09 PM [facility]

## 2018-01-11 ENCOUNTER — Encounter: Payer: Self-pay | Admitting: Physician Assistant

## 2018-01-19 ENCOUNTER — Other Ambulatory Visit: Payer: Self-pay | Admitting: Physician Assistant

## 2018-01-25 ENCOUNTER — Other Ambulatory Visit: Payer: Self-pay | Admitting: Adult Health

## 2018-01-26 ENCOUNTER — Other Ambulatory Visit: Payer: Self-pay | Admitting: Physician Assistant

## 2018-02-01 NOTE — Progress Notes (Deleted)
Complete Physical  Assessment and Plan:  Essential hypertension - continue medications, DASH diet, exercise and monitor at home. Call if greater than 130/80.  -     CBC with Differential/Platelet -     BASIC METABOLIC PANEL WITH GFR -     Hepatic function panel -     TSH -     Urinalysis, Routine w reflex microscopic -     Microalbumin / creatinine urine ratio -     EKG 12-Lead  Hypothyroidism, unspecified type Hypothyroidism-check TSH level, continue medications the same, reminded to take on an empty stomach 30-38mins before food.  -     TSH  Mixed hyperlipidemia -continue medications, check lipids, decrease fatty foods, increase activity.  -     Lipid panel  Prediabetes Discussed general issues about diabetes pathophysiology and management., Educational material distributed., Suggested low cholesterol diet., Encouraged aerobic exercise., Discussed foot care., Reminded to get yearly retinal exam. -     Hemoglobin A1c  Vitamin D deficiency -     VITAMIN D 25 Hydroxy (Vit-D Deficiency, Fractures)  Medication management -     Magnesium  Screening, anemia, deficiency, iron -     Iron,Total/Total Iron Binding Cap -     Vitamin B12  Class 2 severe obesity due to excess calories with serious comorbidity and body mass index (BMI) of 35.0 to 35.9 in adult (HCC) -     Liraglutide -Weight Management (SAXENDA) 18 MG/3ML SOPN; Inject 3 mg into the skin daily. Inject one pen SQ daily for weight loss  Plantar Faciitis-  Conservative treatment, night time orthotics, arch support, RICE, NSAID, stretches given If not better will do injection of dexamethasone  Encounter for general adult medical examination with abnormal findings 1 year  Other migraine without status migrainosus, not intractable Avoid triggers  Irritable bowel syndrome, unspecified type If not on benefiber then add it, decrease stress,  if any worsening symptoms, blood in stool, AB pain, etc call office  Other  constipation Increase water and fiber  PCOS (polycystic ovarian syndrome) Weight loss advised  Arthritis Weight loss advised   Discussed med's effects and SE's. Screening labs and tests as requested with regular follow-up as recommended.  HPI  57 y.o. female  presents for a complete physical.  Her blood pressure has been controlled at home, today their BP is  .  She does not workout. She denies chest pain, shortness of breath, dizziness.    She is on cholesterol medication and denies myalgias. Her cholesterol is at goal. The cholesterol last visit was:  Lab Results  Component Value Date   CHOL 187 08/11/2017   HDL 63 08/11/2017   LDLCALC 106 (H) 08/11/2017   TRIG 87 08/11/2017   CHOLHDL 3.0 08/11/2017  .  She has been working on diet and exercise for prediabetes, she is on bASA, she is on ACE/ARB and denies foot ulcerations, hyperglycemia, hypoglycemia , increased appetite, nausea, paresthesia of the feet, polydipsia, polyuria, visual disturbances, vomiting and weight loss. Last A1C in the office was:  Lab Results  Component Value Date   HGBA1C 5.2 08/11/2017    Patient is on Vitamin D supplement.   Lab Results  Component Value Date   VD25OH 50 12/25/2016     She reports that she is doing weight watchers currently.  She reports that she is not doing this right now.   BMI is There is no height or weight on file to calculate BMI., she is working on diet and exercise.  1 year and 7 month since her mom died, step father died recently, increase stress. Has been working out, hurt left foot on heel, worse for sitting for a long time,better after walking.  Wt Readings from Last 3 Encounters:  08/11/17 178 lb 12.8 oz (81.1 kg)  07/02/17 170 lb 9.6 oz (77.4 kg)  05/13/17 168 lb (76.2 kg)     Current Medications:  Current Outpatient Medications on File Prior to Visit  Medication Sig Dispense Refill  . AMABELZ 1-0.5 MG tablet TAKE 1 TABLET BY MOUTH  DAILY 84 tablet 1  . Aspirin  (STANBACK HEADACHE POWDER PO) Take by mouth.    Marland Kitchen CALCIUM-VITAMIN D PO Take 1 tablet by mouth daily.    Marland Kitchen GARLIC PO Take 1 tablet by mouth 2 (two) times daily.      . INVOKANA 300 MG TABS tablet TAKE 1 TABLET BY MOUTH EVERY DAY 90 tablet 0  . levothyroxine (SYNTHROID, LEVOTHROID) 100 MCG tablet TAKE 1 TABLET BY MOUTH  EVERY MORNING ON AN EMPTY  STOMACH 30 MINUTES BEFORE  BREAKFAST 90 tablet 0  . losartan-hydrochlorothiazide (HYZAAR) 100-25 MG tablet TAKE 1 TABLET BY MOUTH  DAILY 90 tablet 0  . LYSINE PO Take 1 tablet by mouth at bedtime.      . metFORMIN (GLUCOPHAGE) 1000 MG tablet TAKE 1 TABLET BY MOUTH  TWICE A DAY FOR DIABETES 180 tablet 0  . Multiple Vitamins-Minerals (MULTIVITAMIN WITH MINERALS) tablet Take 1 tablet by mouth daily.      . pantoprazole (PROTONIX) 40 MG tablet TAKE 1 TABLET BY MOUTH  DAILY 90 tablet 1  . phentermine (ADIPEX-P) 37.5 MG tablet 1/2-1 tablet in the morning- will cause dry mouth/consitpation, increase water 30 tablet 2  . temazepam (RESTORIL) 30 MG capsule Take 1 capsule at hour of sleep ONLY if needed for insomnia & please try to limit to 5 days per week to avoid addiction 30 capsule 3  . topiramate (TOPAMAX) 50 MG tablet 1/2-1 tablet at night with dinner 30 tablet 3  . valACYclovir (VALTREX) 500 MG tablet TAKE 1 TABLET BY MOUTH TWO  TIMES DAILY AS NEEDED 180 tablet 0   No current facility-administered medications on file prior to visit.     Health Maintenance:   Immunization History  Administered Date(s) Administered  . Influenza,inj,quad, With Preservative 03/18/2016  . Influenza-Unspecified 02/26/2015  . Tdap 07/08/2011  . Zoster 03/21/2014    Tetanus: 2013 Flu vaccine: 2017 Zostavax: 2015 Pap: 2017 MGM:03/2016  DEXA: 2013 Colonoscopy: 2012 Last Eye Exam:  Dr. Gershon Crane 2017  Patient Care Team: Vicie Mutters, PA-C as PCP - General (Physician Assistant) Lafayette Dragon, MD (Inactive) as Consulting Physician (Gastroenterology) Servando Salina, MD as Consulting Physician (Obstetrics and Gynecology)  Allergies: No Known Allergies  Medical History:  Past Medical History:  Diagnosis Date  . Arthritis   . Constipation, chronic   . Diverticulitis   . Diverticulosis   . Fibroid uterus   . History of gallstones   . Hyperlipidemia   . Hypertension   . Hypothyroidism   . Migraine   . PCOS (polycystic ovarian syndrome)    takes metformin for this  . Prediabetes   . Thyroid disease   . Type II or unspecified type diabetes mellitus without mention of complication, not stated as uncontrolled     Surgical History:  Past Surgical History:  Procedure Laterality Date  . BREAST REDUCTION SURGERY  1988  . BREAST SURGERY    . CHOLECYSTECTOMY  1995  .  EYE SURGERY    . LASIK Left   . REFRACTIVE SURGERY Left   . TONSILLECTOMY AND ADENOIDECTOMY  1990's    Family History:  Family History  Problem Relation Age of Onset  . Diabetes Mother   . Lung cancer Mother   . COPD Mother   . Diverticulitis Mother   . Leukemia Mother   . Heart disease Mother   . Diabetes Sister   . Colon cancer Neg Hx     Social History:  Social History   Tobacco Use  . Smoking status: Never Smoker  . Smokeless tobacco: Never Used  Substance Use Topics  . Alcohol use: Yes    Comment: rarely  . Drug use: No    Review of Systems: Review of Systems  Constitutional: Negative for chills, fever and malaise/fatigue.  HENT: Negative for congestion, ear pain and sore throat.   Eyes: Negative.   Respiratory: Negative for cough, shortness of breath and wheezing.   Cardiovascular: Negative for chest pain, palpitations and leg swelling.  Gastrointestinal: Negative for abdominal pain, blood in stool, constipation, diarrhea, heartburn and melena.  Genitourinary: Negative.   Skin: Negative.   Neurological: Negative for dizziness, sensory change, loss of consciousness and headaches.  Psychiatric/Behavioral: Negative for depression. The patient  is not nervous/anxious and does not have insomnia.     Physical Exam: Estimated body mass index is 35.01 kg/m as calculated from the following:   Height as of 07/02/17: 4' 11.92" (1.522 m).   Weight as of 08/11/17: 178 lb 12.8 oz (81.1 kg). There were no vitals taken for this visit.  General Appearance: Well nourished well developed, in no apparent distress.  Eyes: PERRLA, EOMs, conjunctiva no swelling or erythema ENT/Mouth: Ear canals normal without obstruction, swelling, erythema, or discharge.  TMs normal bilaterally with no erythema, bulging, retraction, or loss of landmark.  Oropharynx moist and clear with no exudate, erythema, or swelling.   Neck: Supple, thyroid normal. No bruits.  No cervical adenopathy Respiratory: Respiratory effort normal, Breath sounds clear A&P without wheeze, rhonchi, rales.   Cardio: RRR without murmurs, rubs or gallops. Brisk peripheral pulses without edema.  Chest: symmetric, with normal excursions Breasts: Deferred to obgyn Abdomen: Soft, nontender, no guarding, rebound, hernias, masses, or organomegaly.  Lymphatics: Non tender without lymphadenopathy.  Musculoskeletal: Full ROM all peripheral extremities,5/5 strength, and normal gait.  Skin: Warm, dry without rashes, lesions, ecchymosis. Neuro: Awake and oriented X 3, Cranial nerves intact, reflexes equal bilaterally. Normal muscle tone, no cerebellar symptoms. Sensation intact.  Psych:  normal affect, Insight and Judgment appropriate.   EKG: WNL sinus tachy no ST changes.  Over 40 minutes of exam, counseling, chart review and critical decision making was performed  Vicie Mutters 12:29 PM [facility]

## 2018-02-03 ENCOUNTER — Encounter: Payer: Self-pay | Admitting: Physician Assistant

## 2018-02-25 ENCOUNTER — Other Ambulatory Visit: Payer: Self-pay | Admitting: Internal Medicine

## 2018-02-25 DIAGNOSIS — H5712 Ocular pain, left eye: Secondary | ICD-10-CM | POA: Diagnosis not present

## 2018-02-26 DIAGNOSIS — H00024 Hordeolum internum left upper eyelid: Secondary | ICD-10-CM | POA: Diagnosis not present

## 2018-03-08 ENCOUNTER — Other Ambulatory Visit: Payer: Self-pay | Admitting: Adult Health

## 2018-03-08 NOTE — Progress Notes (Deleted)
Complete Physical  Assessment and Plan:  Essential hypertension - continue medications, DASH diet, exercise and monitor at home. Call if greater than 130/80.  -     CBC with Differential/Platelet -     BASIC METABOLIC PANEL WITH GFR -     Hepatic function panel -     TSH -     Urinalysis, Routine w reflex microscopic -     Microalbumin / creatinine urine ratio -     EKG 12-Lead  Hypothyroidism, unspecified type Hypothyroidism-check TSH level, continue medications the same, reminded to take on an empty stomach 30-38mins before food.  -     TSH  Mixed hyperlipidemia -continue medications, check lipids, decrease fatty foods, increase activity.  -     Lipid panel  Prediabetes Discussed general issues about diabetes pathophysiology and management., Educational material distributed., Suggested low cholesterol diet., Encouraged aerobic exercise., Discussed foot care., Reminded to get yearly retinal exam. -     Hemoglobin A1c  Vitamin D deficiency -     VITAMIN D 25 Hydroxy (Vit-D Deficiency, Fractures)  Medication management -     Magnesium  Screening, anemia, deficiency, iron -     Iron,Total/Total Iron Binding Cap -     Vitamin B12  Class 2 severe obesity due to excess calories with serious comorbidity and body mass index (BMI) of 35.0 to 35.9 in adult (HCC) -     Liraglutide -Weight Management (SAXENDA) 18 MG/3ML SOPN; Inject 3 mg into the skin daily. Inject one pen SQ daily for weight loss  Plantar Faciitis-  Conservative treatment, night time orthotics, arch support, RICE, NSAID, stretches given If not better will do injection of dexamethasone  Encounter for general adult medical examination with abnormal findings 1 year  Other migraine without status migrainosus, not intractable Avoid triggers  Irritable bowel syndrome, unspecified type If not on benefiber then add it, decrease stress,  if any worsening symptoms, blood in stool, AB pain, etc call office  Other  constipation Increase water and fiber  PCOS (polycystic ovarian syndrome) Weight loss advised  Arthritis Weight loss advised   Discussed med's effects and SE's. Screening labs and tests as requested with regular follow-up as recommended.  HPI  57 y.o. female  presents for a complete physical.  Her blood pressure has been controlled at home, today their BP is  .  She does not workout. She denies chest pain, shortness of breath, dizziness.    She is on cholesterol medication and denies myalgias. Her cholesterol is at goal. The cholesterol last visit was:  Lab Results  Component Value Date   CHOL 187 08/11/2017   HDL 63 08/11/2017   LDLCALC 106 (H) 08/11/2017   TRIG 87 08/11/2017   CHOLHDL 3.0 08/11/2017  .  She has been working on diet and exercise for prediabetes, she is on bASA, she is on ACE/ARB and denies foot ulcerations, hyperglycemia, hypoglycemia , increased appetite, nausea, paresthesia of the feet, polydipsia, polyuria, visual disturbances, vomiting and weight loss. Last A1C in the office was:  Lab Results  Component Value Date   HGBA1C 5.2 08/11/2017    Patient is on Vitamin D supplement.   Lab Results  Component Value Date   VD25OH 50 12/25/2016     She reports that she is doing weight watchers currently.  She reports that she is not doing this right now.   BMI is There is no height or weight on file to calculate BMI., she is working on diet and exercise.  1 year and 7 month since her mom died, step father died recently, increase stress. Has been working out, hurt left foot on heel, worse for sitting for a long time,better after walking.  Wt Readings from Last 3 Encounters:  08/11/17 178 lb 12.8 oz (81.1 kg)  07/02/17 170 lb 9.6 oz (77.4 kg)  05/13/17 168 lb (76.2 kg)     Current Medications:  Current Outpatient Medications on File Prior to Visit  Medication Sig Dispense Refill  . AMABELZ 1-0.5 MG tablet TAKE 1 TABLET BY MOUTH  DAILY 84 tablet 1  . Aspirin  (STANBACK HEADACHE POWDER PO) Take by mouth.    Marland Kitchen CALCIUM-VITAMIN D PO Take 1 tablet by mouth daily.    Marland Kitchen GARLIC PO Take 1 tablet by mouth 2 (two) times daily.      . INVOKANA 300 MG TABS tablet TAKE 1 TABLET BY MOUTH EVERY DAY 90 tablet 0  . levothyroxine (SYNTHROID, LEVOTHROID) 100 MCG tablet TAKE 1 TABLET BY MOUTH  EVERY MORNING ON AN EMPTY  STOMACH 30 MINUTES BEFORE  BREAKFAST 30 tablet 0  . losartan-hydrochlorothiazide (HYZAAR) 100-25 MG tablet TAKE 1 TABLET BY MOUTH  DAILY 90 tablet 0  . LYSINE PO Take 1 tablet by mouth at bedtime.      . metFORMIN (GLUCOPHAGE) 1000 MG tablet TAKE 1 TABLET BY MOUTH  TWICE A DAY FOR DIABETES 60 tablet 0  . Multiple Vitamins-Minerals (MULTIVITAMIN WITH MINERALS) tablet Take 1 tablet by mouth daily.      . pantoprazole (PROTONIX) 40 MG tablet TAKE 1 TABLET BY MOUTH  DAILY 90 tablet 1  . phentermine (ADIPEX-P) 37.5 MG tablet 1/2-1 tablet in the morning- will cause dry mouth/consitpation, increase water 30 tablet 2  . temazepam (RESTORIL) 30 MG capsule Take 1 capsule at hour of sleep ONLY if needed for insomnia & please try to limit to 5 days per week to avoid addiction 30 capsule 3  . topiramate (TOPAMAX) 50 MG tablet 1/2-1 tablet at night with dinner 30 tablet 3  . valACYclovir (VALTREX) 500 MG tablet TAKE 1 TABLET BY MOUTH TWO  TIMES DAILY AS NEEDED 60 tablet 0   No current facility-administered medications on file prior to visit.     Health Maintenance:   Immunization History  Administered Date(s) Administered  . Influenza,inj,quad, With Preservative 03/18/2016  . Influenza-Unspecified 02/26/2015  . Tdap 07/08/2011  . Zoster 03/21/2014    Tetanus: 2013 Flu vaccine: 2017 Zostavax: 2015 Pap: 2017 MGM:03/2016  DEXA: 2013 Colonoscopy: 2012 Last Eye Exam:  Dr. Gershon Crane 2017  Patient Care Team: Vicie Mutters, PA-C as PCP - General (Physician Assistant) Lafayette Dragon, MD (Inactive) as Consulting Physician (Gastroenterology) Servando Salina,  MD as Consulting Physician (Obstetrics and Gynecology)  Allergies: No Known Allergies  Medical History:  Past Medical History:  Diagnosis Date  . Arthritis   . Constipation, chronic   . Diverticulitis   . Diverticulosis   . Fibroid uterus   . History of gallstones   . Hyperlipidemia   . Hypertension   . Hypothyroidism   . Migraine   . PCOS (polycystic ovarian syndrome)    takes metformin for this  . Prediabetes   . Thyroid disease   . Type II or unspecified type diabetes mellitus without mention of complication, not stated as uncontrolled     SURGICAL HISTORY She  has a past surgical history that includes Breast reduction surgery (1988); Cholecystectomy (1995); Tonsillectomy and adenoidectomy (1990's); LASIK (Left); Refractive surgery (Left); Breast surgery; and Eye  surgery. FAMILY HISTORY Her family history includes COPD in her mother; Diabetes in her mother and sister; Diverticulitis in her mother; Heart disease in her mother; Leukemia in her mother; Lung cancer in her mother. SOCIAL HISTORY She  reports that she has never smoked. She has never used smokeless tobacco. She reports that she drinks alcohol. She reports that she does not use drugs.   Review of Systems: Review of Systems  Constitutional: Negative for chills, fever and malaise/fatigue.  HENT: Negative for congestion, ear pain and sore throat.   Eyes: Negative.   Respiratory: Negative for cough, shortness of breath and wheezing.   Cardiovascular: Negative for chest pain, palpitations and leg swelling.  Gastrointestinal: Negative for abdominal pain, blood in stool, constipation, diarrhea, heartburn and melena.  Genitourinary: Negative.   Skin: Negative.   Neurological: Negative for dizziness, sensory change, loss of consciousness and headaches.  Psychiatric/Behavioral: Negative for depression. The patient is not nervous/anxious and does not have insomnia.     Physical Exam: Estimated body mass index is 35.01  kg/m as calculated from the following:   Height as of 07/02/17: 4' 11.92" (1.522 m).   Weight as of 08/11/17: 178 lb 12.8 oz (81.1 kg). There were no vitals taken for this visit.  General Appearance: Well nourished well developed, in no apparent distress.  Eyes: PERRLA, EOMs, conjunctiva no swelling or erythema ENT/Mouth: Ear canals normal without obstruction, swelling, erythema, or discharge.  TMs normal bilaterally with no erythema, bulging, retraction, or loss of landmark.  Oropharynx moist and clear with no exudate, erythema, or swelling.   Neck: Supple, thyroid normal. No bruits.  No cervical adenopathy Respiratory: Respiratory effort normal, Breath sounds clear A&P without wheeze, rhonchi, rales.   Cardio: RRR without murmurs, rubs or gallops. Brisk peripheral pulses without edema.  Chest: symmetric, with normal excursions Breasts: Deferred to obgyn Abdomen: Soft, nontender, no guarding, rebound, hernias, masses, or organomegaly.  Lymphatics: Non tender without lymphadenopathy.  Musculoskeletal: Full ROM all peripheral extremities,5/5 strength, and normal gait.  Skin: Warm, dry without rashes, lesions, ecchymosis. Neuro: Awake and oriented X 3, Cranial nerves intact, reflexes equal bilaterally. Normal muscle tone, no cerebellar symptoms. Sensation intact.  Psych:  normal affect, Insight and Judgment appropriate.   EKG: WNL sinus tachy no ST changes.  Over 40 minutes of exam, counseling, chart review and critical decision making was performed  Vicie Mutters 12:28 PM [facility]

## 2018-03-10 ENCOUNTER — Encounter: Payer: Self-pay | Admitting: Physician Assistant

## 2018-03-17 ENCOUNTER — Other Ambulatory Visit: Payer: Self-pay | Admitting: Adult Health

## 2018-04-04 ENCOUNTER — Other Ambulatory Visit: Payer: Self-pay | Admitting: Adult Health

## 2018-04-04 ENCOUNTER — Other Ambulatory Visit: Payer: Self-pay | Admitting: Internal Medicine

## 2018-04-13 ENCOUNTER — Other Ambulatory Visit: Payer: Self-pay | Admitting: Physician Assistant

## 2018-04-15 ENCOUNTER — Other Ambulatory Visit: Payer: Self-pay | Admitting: Obstetrics and Gynecology

## 2018-04-15 DIAGNOSIS — Z1231 Encounter for screening mammogram for malignant neoplasm of breast: Secondary | ICD-10-CM

## 2018-04-21 NOTE — Progress Notes (Signed)
Complete Physical  Assessment and Plan:  Constipation, unspecified constipation type -     Celiac Disease Comprehensive Panel with Reflexes -     Ambulatory referral to Gastroenterology  Abdominal bloating -     Celiac Disease Comprehensive Panel with Reflexes -     Ambulatory referral to Gastroenterology -     US PELVIC COMPLETE WITH TRANSVAGINAL; Future -     POCT Urine Pregnancy  Anemia, unspecified type -     Iron,Total/Total Iron Binding Cap -     Vitamin B12  Thyroid nodule -     US THYROID; Future  Essential hypertension - continue medications, DASH diet, exercise and monitor at home. Call if greater than 130/80.  -     CBC with Differential/Platelet -     TSH -     COMPLETE METABOLIC PANEL WITH GFR -     Urinalysis, Routine w reflex microscopic -     Microalbumin / creatinine urine ratio -     EKG 12-Lead  Hypothyroidism, unspecified type Hypothyroidism-check TSH level, continue medications the same, reminded to take on an empty stomach 30-41mins before food.  -     TSH  Mixed hyperlipidemia check lipids decrease fatty foods increase activity.  -     Lipid panel  Prediabetes Discussed disease progression and risks Discussed diet/exercise, weight management and risk modification -     Hemoglobin A1c  Medication management -     Magnesium  Vitamin D deficiency -     VITAMIN D 25 Hydroxy (Vit-D Deficiency, Fractures)  Morbid obesity ([place]) - follow up 3 months for progress monitoring - increase veggies, decrease carbs - long discussion about weight loss, diet, and exercise  Endometriosis Monitor  Arthritis Continue exercise  PCOS (polycystic ovarian syndrome) Monitor  Diverticulosis Monitor  Irritable bowel syndrome, unspecified type Increase fiber  Other migraine without status migrainosus, not intractable    Discussed med's effects and SE's. Screening labs and tests as requested with regular follow-up as recommended.  HPI  57 y.o.  female  presents for a complete physical.  Her blood pressure has been controlled at home, today their BP is BP: 128/84. She does not workout. She denies chest pain, shortness of breath, dizziness.    She has been having weight gain, around her AB with bloating, she has been having nausea, hot flashes. She has been on prednisone x 10 days for sinus/allergies. Weight is up even though she is walking and eating less.   She has chronic constipation, was on linzess in the past after colonoscopy 2012.   She is on cholesterol medication and denies myalgias. Her cholesterol is at goal. The cholesterol last visit was:  Lab Results  Component Value Date   CHOL 187 08/11/2017   HDL 63 08/11/2017   LDLCALC 106 (H) 08/11/2017   TRIG 87 08/11/2017   CHOLHDL 3.0 08/11/2017  .  She has been working on diet and exercise for prediabetes, was in DM range once, started on metformin and invokana and now in range, she is on bASA, she is on ACE/ARB and denies foot ulcerations, hyperglycemia, hypoglycemia , increased appetite, nausea, paresthesia of the feet, polydipsia, polyuria, visual disturbances, vomiting and weight loss. Last A1C in the office was:  Lab Results  Component Value Date   HGBA1C 5.2 08/11/2017   Patient is on Vitamin D supplement.   Lab Results  Component Value Date   VD25OH 39 12/25/2016     She reports that she is doing weight  watchers currently.  She reports that she is not doing this right now.   BMI is Body mass index is 35.74 kg/m., she is working on diet and exercise. She states she is eating less, walking and doing kick boxing.  Wt Readings from Last 3 Encounters:  04/22/18 183 lb (83 kg)  08/11/17 178 lb 12.8 oz (81.1 kg)  07/02/17 170 lb 9.6 oz (77.4 kg)   She is on thyroid medication. Her medication was not changed last visit.   Lab Results  Component Value Date   TSH 1.03 08/11/2017  .    Current Medications:  Current Outpatient Medications on File Prior to Visit   Medication Sig Dispense Refill  . AMABELZ 1-0.5 MG tablet TAKE 1 TABLET BY MOUTH  DAILY 84 tablet 1  . Aspirin (STANBACK HEADACHE POWDER PO) Take by mouth.    Marland Kitchen CALCIUM-VITAMIN D PO Take 1 tablet by mouth daily.    Marland Kitchen GARLIC PO Take 1 tablet by mouth 2 (two) times daily.      . INVOKANA 300 MG TABS tablet TAKE 1 TABLET BY MOUTH EVERY DAY 90 tablet 0  . levothyroxine (SYNTHROID, LEVOTHROID) 100 MCG tablet TAKE 1 TABLET BY MOUTH  EVERY MORNING ON AN EMPTY  STOMACH 30 MINUTES BEFORE  BREAKFAST 30 tablet 0  . losartan-hydrochlorothiazide (HYZAAR) 100-25 MG tablet Take 1 tablet daily for BP - to last til 12/20 Office Visit 21 tablet 0  . LYSINE PO Take 1 tablet by mouth at bedtime.      . metFORMIN (GLUCOPHAGE) 1000 MG tablet TAKE 1 TABLET BY MOUTH  TWICE A DAY FOR DIABETES 60 tablet 0  . Multiple Vitamins-Minerals (MULTIVITAMIN WITH MINERALS) tablet Take 1 tablet by mouth daily.      . pantoprazole (PROTONIX) 40 MG tablet TAKE 1 TABLET BY MOUTH  DAILY 90 tablet 1  . temazepam (RESTORIL) 30 MG capsule TAKE 1 CAPSULE BY MOUTH AT HOUR OF SLEEP ONLY IF NEEDED FOR INSOMNIA AND PLEASE TRY TO LIMIT TO 5 DAYS PER WEEK 30 capsule 0  . valACYclovir (VALTREX) 500 MG tablet TAKE 1 TABLET BY MOUTH TWO  TIMES DAILY AS NEEDED 60 tablet 0   No current facility-administered medications on file prior to visit.     Health Maintenance:   Immunization History  Administered Date(s) Administered  . Influenza,inj,quad, With Preservative 03/18/2016  . Influenza-Unspecified 02/26/2015  . Tdap 07/08/2011  . Zoster 03/21/2014    Tetanus: 2013 Flu vaccine: 2017 Zostavax: 2015 Pap: 2017 MGM:04/2017  DEXA: 2013 Colonoscopy: 2012 Last Eye Exam:  Dr. Gershon Crane 2017  Patient Care Team: Vicie Mutters, PA-C as PCP - General (Physician Assistant) Lafayette Dragon, MD (Inactive) as Consulting Physician (Gastroenterology) Servando Salina, MD as Consulting Physician (Obstetrics and Gynecology)  Allergies: No Known  Allergies  Medical History:  Past Medical History:  Diagnosis Date  . Arthritis   . Constipation, chronic   . Diverticulitis   . Diverticulosis   . Fibroid uterus   . History of gallstones   . Hyperlipidemia   . Hypertension   . Hypothyroidism   . Migraine   . PCOS (polycystic ovarian syndrome)    takes metformin for this  . Prediabetes   . Thyroid disease   . Type II or unspecified type diabetes mellitus without mention of complication, not stated as uncontrolled     Surgical History:  Past Surgical History:  Procedure Laterality Date  . BREAST REDUCTION SURGERY  1988  . BREAST SURGERY    . CHOLECYSTECTOMY  [place]    . LASIK Left   . REFRACTIVE SURGERY Left   . TONSILLECTOMY AND ADENOIDECTOMY  1990's    Family History:  Family History  Problem Relation Age of Onset  . Diabetes Mother   . Lung cancer Mother   . COPD Mother   . Diverticulitis Mother   . Leukemia Mother   . Heart disease Mother   . Diabetes Sister   . Colon cancer Neg Hx     Social History:  Social History   Tobacco Use  . Smoking status: Never Smoker  . Smokeless tobacco: Never Used  Substance Use Topics  . Alcohol use: Yes    Comment: rarely  . Drug use: No    Review of Systems: Review of Systems  Constitutional: Negative for chills, fever and malaise/fatigue.  HENT: Negative for congestion, ear pain and sore throat.   Eyes: Negative.   Respiratory: Negative for cough, shortness of breath and wheezing.   Cardiovascular: Negative for chest pain, palpitations and leg swelling.  Gastrointestinal: Negative for abdominal pain, blood in stool, constipation, diarrhea, heartburn and melena.  Genitourinary: Negative.   Skin: Negative.   Neurological: Negative for dizziness, sensory change, loss of consciousness and headaches.  Psychiatric/Behavioral: Negative for depression. The patient is not nervous/anxious and does not have insomnia.     Physical Exam: Estimated body  mass index is 35.74 kg/m as calculated from the following:   Height as of this encounter: 5' (1.524 m).   Weight as of this encounter: 183 lb (83 kg). BP 128/84   Pulse 69   Temp (!) 97.3 F (36.3 C)   Ht 5' (1.524 m)   Wt 183 lb (83 kg)   SpO2 98%   BMI 35.74 kg/m   General Appearance: Well nourished well developed, in no apparent distress.  Eyes: PERRLA, EOMs, conjunctiva no swelling or erythema ENT/Mouth: Ear canals normal without obstruction, swelling, erythema, or discharge.  TMs normal bilaterally with no erythema, bulging, retraction, or loss of landmark.  Oropharynx moist and clear with no exudate, erythema, or swelling.   Neck: Supple, thyroid nodule left side. No bruits.  No cervical adenopathy Respiratory: Respiratory effort normal, Breath sounds clear A&P without wheeze, rhonchi, rales.   Cardio: RRR without murmurs, rubs or gallops. Brisk peripheral pulses without edema.  Chest: symmetric, with normal excursions Breasts: Deferred to obgyn Abdomen: Soft, obese, nontender, no guarding, rebound, hernias, masses, or organomegaly.  Lymphatics: Non tender without lymphadenopathy.  Musculoskeletal: Full ROM all peripheral extremities,5/5 strength, and normal gait.  Skin: Warm, dry without rashes, lesions, ecchymosis. Neuro: Awake and oriented X 3, Cranial nerves intact, reflexes equal bilaterally. Normal muscle tone, no cerebellar symptoms. Sensation intact.  Psych:  normal affect, Insight and Judgment appropriate.   EKG: WNL  no ST changes.  Over 40 minutes of exam, counseling, chart review and critical decision making was performed  Vicie Mutters 2:08 PM [facility]

## 2018-04-22 ENCOUNTER — Encounter: Payer: Self-pay | Admitting: Physician Assistant

## 2018-04-22 ENCOUNTER — Ambulatory Visit (INDEPENDENT_AMBULATORY_CARE_PROVIDER_SITE_OTHER): Payer: 59 | Admitting: Physician Assistant

## 2018-04-22 VITALS — BP 128/84 | HR 69 | Temp 97.3°F | Ht 60.0 in | Wt 183.0 lb

## 2018-04-22 DIAGNOSIS — R6889 Other general symptoms and signs: Secondary | ICD-10-CM

## 2018-04-22 DIAGNOSIS — Z136 Encounter for screening for cardiovascular disorders: Secondary | ICD-10-CM

## 2018-04-22 DIAGNOSIS — G43809 Other migraine, not intractable, without status migrainosus: Secondary | ICD-10-CM

## 2018-04-22 DIAGNOSIS — I10 Essential (primary) hypertension: Secondary | ICD-10-CM | POA: Diagnosis not present

## 2018-04-22 DIAGNOSIS — M1990 Unspecified osteoarthritis, unspecified site: Secondary | ICD-10-CM

## 2018-04-22 DIAGNOSIS — K5900 Constipation, unspecified: Secondary | ICD-10-CM

## 2018-04-22 DIAGNOSIS — Z0001 Encounter for general adult medical examination with abnormal findings: Secondary | ICD-10-CM | POA: Diagnosis not present

## 2018-04-22 DIAGNOSIS — E039 Hypothyroidism, unspecified: Secondary | ICD-10-CM | POA: Diagnosis not present

## 2018-04-22 DIAGNOSIS — R140 Abdominal distension (gaseous): Secondary | ICD-10-CM

## 2018-04-22 DIAGNOSIS — Z79899 Other long term (current) drug therapy: Secondary | ICD-10-CM

## 2018-04-22 DIAGNOSIS — N809 Endometriosis, unspecified: Secondary | ICD-10-CM

## 2018-04-22 DIAGNOSIS — E282 Polycystic ovarian syndrome: Secondary | ICD-10-CM

## 2018-04-22 DIAGNOSIS — E782 Mixed hyperlipidemia: Secondary | ICD-10-CM

## 2018-04-22 DIAGNOSIS — D649 Anemia, unspecified: Secondary | ICD-10-CM | POA: Diagnosis not present

## 2018-04-22 DIAGNOSIS — E041 Nontoxic single thyroid nodule: Secondary | ICD-10-CM

## 2018-04-22 DIAGNOSIS — E559 Vitamin D deficiency, unspecified: Secondary | ICD-10-CM

## 2018-04-22 DIAGNOSIS — R7303 Prediabetes: Secondary | ICD-10-CM

## 2018-04-22 DIAGNOSIS — K5790 Diverticulosis of intestine, part unspecified, without perforation or abscess without bleeding: Secondary | ICD-10-CM

## 2018-04-22 DIAGNOSIS — K589 Irritable bowel syndrome without diarrhea: Secondary | ICD-10-CM

## 2018-04-22 DIAGNOSIS — Z7184 Encounter for health counseling related to travel: Secondary | ICD-10-CM

## 2018-04-22 LAB — POCT URINE PREGNANCY: Preg Test, Ur: NEGATIVE

## 2018-04-22 MED ORDER — HYOSCYAMINE SULFATE SL 0.125 MG SL SUBL: 0.1250 mg | SUBLINGUAL_TABLET | SUBLINGUAL | 0 refills | Status: DC | PRN

## 2018-04-22 MED ORDER — MECLIZINE HCL 25 MG PO TABS: ORAL_TABLET | ORAL | 0 refills | Status: DC

## 2018-04-22 MED ORDER — FLUCONAZOLE 150 MG PO TABS: 150.0000 mg | ORAL_TABLET | Freq: Every day | ORAL | 0 refills | Status: DC

## 2018-04-22 MED ORDER — SCOPOLAMINE 1 MG/3DAYS TD PT72: 1.0000 | MEDICATED_PATCH | TRANSDERMAL | 0 refills | Status: DC

## 2018-04-22 MED ORDER — PROMETHAZINE-DM 6.25-15 MG/5ML PO SYRP: 5.0000 mL | ORAL_SOLUTION | Freq: Four times a day (QID) | ORAL | 1 refills | Status: DC | PRN

## 2018-04-22 MED ORDER — ONDANSETRON HCL 4 MG PO TABS: 4.0000 mg | ORAL_TABLET | Freq: Every day | ORAL | 1 refills | Status: DC | PRN

## 2018-04-22 NOTE — Patient Instructions (Addendum)
YOU CAN CALL TO MAKE AN ULTRASOUND..  I have put in an order for an ultrasound for you to have You can set them up at your convenience by calling this number [phone] You will likely have the ultrasound at [address]  If you have any issues call our office and we will set this up for you.     2 apps for tracking food is myfitness pal  loseit OR can take picture of your food     When it comes to diets, agreement about the perfect plan isn't easy to find, even among the experts. Experts at the [employer] developed an idea known as the Healthy Eating Plate. Just imagine a plate divided into logical, healthy portions.  The emphasis is on diet quality:  Load up on vegetables and fruits - one-half of your plate: Aim for color and variety, and remember that potatoes don't count.  Go for whole grains - one-quarter of your plate: Whole wheat, barley, wheat berries, quinoa, oats, brown rice, and foods made with them. If you want pasta, go with whole wheat pasta.  Protein power - one-quarter of your plate: Fish, chicken, beans, and nuts are all healthy, versatile protein sources. Limit red meat.  The diet, however, does go beyond the plate, offering a few other suggestions.  Use healthy plant oils, such as olive, canola, soy, corn, sunflower and peanut. Check the labels, and avoid partially hydrogenated oil, which have unhealthy trans fats.  If you're thirsty, drink water. Coffee and tea are good in moderation, but skip sugary drinks and limit milk and dairy products to one or two daily servings.  The type of carbohydrate in the diet is more important than the amount. Some sources of carbohydrates, such as vegetables, fruits, whole grains, and beans-are healthier than others.  Finally, stay active.   Google mindful eating and here are some tips and tricks below.   Rate your hunger before you eat on a scale of 1-10, try to eat closer to a 6 or  higher. And if you are at below that, why are you eating? Slow down and listen to your body.

## 2018-04-27 ENCOUNTER — Encounter: Payer: Self-pay | Admitting: Adult Health Nurse Practitioner

## 2018-04-27 ENCOUNTER — Ambulatory Visit
Admission: RE | Admit: 2018-04-27 | Discharge: 2018-04-27 | Disposition: A | Discharge status: Home / Self Care | Payer: 59 | Source: Ambulatory Visit | Attending: Physician Assistant | Admitting: Physician Assistant

## 2018-04-27 ENCOUNTER — Ambulatory Visit: Payer: 59 | Admitting: Adult Health Nurse Practitioner

## 2018-04-27 VITALS — BP 124/82 | HR 97 | Temp 97.3°F | Wt 180.0 lb

## 2018-04-27 DIAGNOSIS — R1084 Generalized abdominal pain: Secondary | ICD-10-CM

## 2018-04-27 DIAGNOSIS — E041 Nontoxic single thyroid nodule: Secondary | ICD-10-CM

## 2018-04-27 DIAGNOSIS — R140 Abdominal distension (gaseous): Secondary | ICD-10-CM

## 2018-04-27 DIAGNOSIS — R197 Diarrhea, unspecified: Secondary | ICD-10-CM

## 2018-04-27 DIAGNOSIS — R110 Nausea: Secondary | ICD-10-CM

## 2018-04-27 NOTE — Progress Notes (Signed)
Assessment and Plan: Will send patient for abdominal ultrasound Has transvaginal ultrasound scheduled for today Discussed diverticulitis, gastritis, viral etiology as well as ulcers. There are no diagnoses linked to this encounter.  Kelly Goodwin was seen today for GI problem.  Diagnoses and all orders for this visit:  Generalized abdominal pain -     Amylase -     Lipase -     US Abdomen Complete; Future Will contact with results  Nausea Has Zofran for PRN use Bland foods Monitor symptoms  Diarrhea, unspecified type Bland diet Increase water intake to prevent dehydration Consider probiotic to help with diarrhea and gas/bloating.  Discussed hospital precautions with patient  Call or return with new or worsening symptoms as discussed in appointment.  May contact via office phone [phone] or via [place].   Further disposition pending results of labs. Discussed med's effects and SE's.   Over 30 minutes of exam, counseling, chart review, and critical decision making was performed.   Future Appointments  Date Time Provider [place]  04/27/2018  4:00 PM [facility] [place] 5 [facility]  05/11/2018  8:00 AM [facility] 3 [facility]  08/02/2018  4:00 PM Vicie Mutters, PA-C [facility] None  04/25/2019  2:00 PM Vicie Mutters, PA-C [facility] None    --------------------------------------------------- stomach pains.  She has history of diverticulitis.---------------------------------------------------------------   HPI 57 y.o.female presents for stomach pains.  She has history of diverticulitis.  Anytime she has food or drink she get bloated.  Then progresses to emesis and liquid diarrhea.  She has these similar symptoms on 12/26.  These have increased over the weekend.  She wakes up nauseated.  She reports mid abdomen bloating with pain on left side.  Reports she is normally constipated and prior to this she was taking Linzess regularly and having bowel movement  every 1 to 3 days.  Denies any change in her eating habits.  She has been taking pepto bismol to help with her symptoms with mild relive.  Ultrasound of thyroid for rule out nodule.  Ultrasound of abdomen related to bloating and pelvic pain.  Past Medical History:  Diagnosis Date  . Arthritis   . Constipation, chronic   . Diverticulitis   . Diverticulosis   . Fibroid uterus   . History of gallstones   . Hyperlipidemia   . Hypertension   . Hypothyroidism   . Migraine   . PCOS (polycystic ovarian syndrome)    takes metformin for this  . Prediabetes   . Thyroid disease   . Type II or unspecified type diabetes mellitus without mention of complication, not stated as uncontrolled      No Known Allergies  Current Outpatient Medications on File Prior to Visit  Medication Sig  . AMABELZ 1-0.5 MG tablet TAKE 1 TABLET BY MOUTH  DAILY  . Aspirin (STANBACK HEADACHE POWDER PO) Take by mouth.  Marland Kitchen CALCIUM-VITAMIN D PO Take 1 tablet by mouth daily.  . fluconazole (DIFLUCAN) 150 MG tablet Take 1 tablet (150 mg total) by mouth daily.  Marland Kitchen GARLIC PO Take 1 tablet by mouth 2 (two) times daily.    Marland Kitchen Hyoscyamine Sulfate SL (LEVSIN/SL) 0.125 MG SUBL Place 0.125 mg under the tongue every 4 (four) hours as needed (diarrhea/AB cramping).  . INVOKANA 300 MG TABS tablet TAKE 1 TABLET BY MOUTH EVERY DAY  . levothyroxine (SYNTHROID, LEVOTHROID) 100 MCG tablet TAKE 1 TABLET BY MOUTH  EVERY MORNING ON AN EMPTY  STOMACH 30 MINUTES BEFORE  BREAKFAST  . losartan-hydrochlorothiazide (  HYZAAR) 100-25 MG tablet Take 1 tablet daily for BP - to last til 12/20 Office Visit  . LYSINE PO Take 1 tablet by mouth at bedtime.    . meclizine (ANTIVERT) 25 MG tablet 1/2-1 pill up to 3 times daily for motion sickness/dizziness  . metFORMIN (GLUCOPHAGE) 1000 MG tablet TAKE 1 TABLET BY MOUTH  TWICE A DAY FOR DIABETES  . Multiple Vitamins-Minerals (MULTIVITAMIN WITH MINERALS) tablet Take 1 tablet by mouth daily.    . ondansetron  (ZOFRAN) 4 MG tablet Take 1 tablet (4 mg total) by mouth daily as needed for nausea or vomiting (Not sedating).  . pantoprazole (PROTONIX) 40 MG tablet TAKE 1 TABLET BY MOUTH  DAILY  . promethazine-dextromethorphan (PROMETHAZINE-DM) 6.25-15 MG/5ML syrup Take 5 mLs by mouth 4 (four) times daily as needed for cough.  Marland Kitchen scopolamine (TRANSDERM-SCOP, 1.5 MG,) 1 MG/3DAYS Place 1 patch (1.5 mg total) onto the skin every 3 (three) days.  . temazepam (RESTORIL) 30 MG capsule TAKE 1 CAPSULE BY MOUTH AT HOUR OF SLEEP ONLY IF NEEDED FOR INSOMNIA AND PLEASE TRY TO LIMIT TO 5 DAYS PER WEEK  . valACYclovir (VALTREX) 500 MG tablet TAKE 1 TABLET BY MOUTH TWO  TIMES DAILY AS NEEDED   No current facility-administered medications on file prior to visit.     ROS: Review of Systems  Constitutional: Positive for malaise/fatigue. Negative for chills, diaphoresis, fever and weight loss.  Respiratory: Negative for cough, hemoptysis, sputum production, shortness of breath and wheezing.   Cardiovascular: Negative for chest pain, palpitations, orthopnea, claudication, leg swelling and PND.  Gastrointestinal: Positive for abdominal pain, diarrhea, heartburn and nausea. Negative for blood in stool, constipation, melena and vomiting.  Genitourinary: Negative for dysuria, flank pain, frequency, hematuria and urgency.  Skin: Negative for itching and rash.  Neurological: Negative for dizziness, tingling, tremors, sensory change, speech change, focal weakness, seizures, loss of consciousness, weakness and headaches.  Endo/Heme/Allergies: Negative for environmental allergies and polydipsia. Does not bruise/bleed easily.   Marland Kitchen   Physical Exam:  BP 124/82   Pulse 97   Temp (!) 97.3 F (36.3 C)   Wt 180 lb (81.6 kg)   SpO2 97%   BMI 35.15 kg/m   General Appearance: Well nourished, in no apparent distress.  ENT/Mouth: Ext aud canals clear, TMs without erythema, bulging. No erythema, swelling, or exudate on post pharynx.   Tonsils not swollen or erythematous. Hearing normal.  Neck: Supple, thyroid normal.  Respiratory: Respiratory effort normal, BS equal bilaterally without rales, rhonchi, wheezing or stridor.  Cardio: RRR with no MRGs. Brisk peripheral pulses without edema.  Abdomen: distended, Hyperactive BS.  Tender to LLQ, no guarding, rebound, hernias, masses. Lymphatics: Non tender without lymphadenopathy.  Skin: Warm, dry without rashes, lesions, ecchymosis.  Neuro: Cranial nerves intact. Normal muscle tone, no cerebellar symptoms. Sensation intact.  Psych: Awake and oriented X 3, normal affect, Insight and Judgment appropriate.     Kelly Sierras, NP 2:39 PM [facility]

## 2018-04-29 ENCOUNTER — Encounter: Payer: Self-pay | Admitting: Adult Health Nurse Practitioner

## 2018-04-29 ENCOUNTER — Ambulatory Visit
Admission: RE | Admit: 2018-04-29 | Discharge: 2018-04-29 | Disposition: A | Discharge status: Home / Self Care | Payer: 59 | Source: Ambulatory Visit | Attending: Adult Health Nurse Practitioner | Admitting: Adult Health Nurse Practitioner

## 2018-04-29 DIAGNOSIS — R197 Diarrhea, unspecified: Secondary | ICD-10-CM | POA: Diagnosis not present

## 2018-04-29 DIAGNOSIS — R1084 Generalized abdominal pain: Secondary | ICD-10-CM

## 2018-04-29 DIAGNOSIS — R109 Unspecified abdominal pain: Secondary | ICD-10-CM | POA: Diagnosis not present

## 2018-04-29 LAB — COMPLETE METABOLIC PANEL WITH GFR
AG Ratio: 1.5 (calc) (ref 1.0–2.5)
ALKALINE PHOSPHATASE (APISO): 57 U/L (ref 33–130)
ALT: 14 U/L (ref 6–29)
AST: 13 U/L (ref 10–35)
Albumin: 4 g/dL (ref 3.6–5.1)
BUN: 12 mg/dL (ref 7–25)
CO2: 28 mmol/L (ref 20–32)
Calcium: 10 mg/dL (ref 8.6–10.4)
Chloride: 104 mmol/L (ref 98–110)
Creat: 0.62 mg/dL (ref 0.50–1.05)
GFR, Est African American: 116 mL/min/{1.73_m2} (ref >=60)
GFR, Est Non African American: 100 mL/min/{1.73_m2} (ref >=60)
Globulin: 2.7 g/dL (calc) (ref 1.9–3.7)
Glucose, Bld: 70 mg/dL (ref 65–99)
Potassium: 4.3 mmol/L (ref 3.5–5.3)
Sodium: 140 mmol/L (ref 135–146)
Total Bilirubin: 0.4 mg/dL (ref 0.2–1.2)
Total Protein: 6.7 g/dL (ref 6.1–8.1)

## 2018-04-29 LAB — TEST AUTHORIZATION 2

## 2018-04-29 LAB — URINALYSIS, ROUTINE W REFLEX MICROSCOPIC
Bilirubin Urine: NEGATIVE
Hgb urine dipstick: NEGATIVE
Ketones, ur: NEGATIVE
Leukocytes, UA: NEGATIVE
Nitrite: NEGATIVE
Protein, ur: NEGATIVE
Specific Gravity, Urine: 1.015 (ref 1.001–1.03)
pH: 8 (ref 5.0–8.0)

## 2018-04-29 LAB — TSH: TSH: 1.07 m[IU]/L (ref 0.40–4.50)

## 2018-04-29 LAB — LIPID PANEL
CHOL/HDL RATIO: 3.7 (calc) (ref <5.0)
CHOLESTEROL: 214 mg/dL — AB (ref <200)
HDL: 58 mg/dL (ref >50)
LDL Cholesterol (Calc): 131 mg/dL (calc) — ABNORMAL HIGH
Non-HDL Cholesterol (Calc): 156 mg/dL (calc) — ABNORMAL HIGH (ref <130)
TRIGLYCERIDES: 132 mg/dL (ref <150)

## 2018-04-29 LAB — CBC WITH DIFFERENTIAL/PLATELET
Absolute Monocytes: 540 cells/uL (ref 200–950)
Basophils Absolute: 40 cells/uL (ref 0–200)
Basophils Relative: 0.4 %
Eosinophils Absolute: 170 cells/uL (ref 15–500)
Eosinophils Relative: 1.7 %
HCT: 41 % (ref 35.0–45.0)
Hemoglobin: 13.3 g/dL (ref 11.7–15.5)
LYMPHS ABS: 2810 {cells}/uL (ref 850–3900)
MCH: 27.4 pg (ref 27.0–33.0)
MCHC: 32.4 g/dL (ref 32.0–36.0)
MCV: 84.4 fL (ref 80.0–100.0)
MPV: 9.7 fL (ref 7.5–12.5)
Monocytes Relative: 5.4 %
Neutro Abs: 6440 cells/uL (ref 1500–7800)
Neutrophils Relative %: 64.4 %
Platelets: 385 10*3/uL (ref 140–400)
RBC: 4.86 10*6/uL (ref 3.80–5.10)
RDW: 14.6 % (ref 11.0–15.0)
Total Lymphocyte: 28.1 %
WBC: 10 10*3/uL (ref 3.8–10.8)

## 2018-04-29 LAB — IRON, TOTAL/TOTAL IRON BINDING CAP
%SAT: 19 % (calc) (ref 16–45)
Iron: 83 ug/dL (ref 45–160)
TIBC: 437 mcg/dL (calc) (ref 250–450)

## 2018-04-29 LAB — MICROALBUMIN / CREATININE URINE RATIO
Creatinine, Urine: 30 mg/dL (ref 20–275)
Microalb Creat Ratio: 7 mcg/mg creat (ref <30)
Microalb, Ur: 0.2 mg/dL

## 2018-04-29 LAB — HEMOGLOBIN A1C
Hgb A1c MFr Bld: 5.6 % of total Hgb (ref <5.7)
Mean Plasma Glucose: 114 (calc)
eAG (mmol/L): 6.3 (calc)

## 2018-04-29 LAB — MAGNESIUM: Magnesium: 2.2 mg/dL (ref 1.5–2.5)

## 2018-04-29 LAB — CELIAC DISEASE COMPREHENSIVE PANEL WITH REFLEXES
(TTG) AB, IGA: 1 U/mL
Immunoglobulin A: 168 mg/dL (ref 47–310)

## 2018-04-29 LAB — VITAMIN D 25 HYDROXY (VIT D DEFICIENCY, FRACTURES): Vit D, 25-Hydroxy: 111 ng/mL — ABNORMAL HIGH (ref 30–100)

## 2018-04-29 LAB — VITAMIN B12: Vitamin B-12: 1326 pg/mL — ABNORMAL HIGH (ref 200–1100)

## 2018-04-29 LAB — AMYLASE: Amylase: 65 U/L (ref 21–101)

## 2018-04-29 LAB — LIPASE: Lipase: 16 U/L (ref 7–60)

## 2018-04-29 NOTE — Patient Instructions (Signed)
We will send you for an abdominal ultrasound.  We will attempt to get this today since you are headed to [place].  We will contact you with results once available to [place].  We will contact you in 1-2 days with lab results that were adde to your previous blood work.  Consider taking probiotic daily to help with gas and bloating.  Continue PPI daily.  Consider  Discussed hospital precautions

## 2018-04-30 ENCOUNTER — Other Ambulatory Visit: Payer: Self-pay | Admitting: Adult Health Nurse Practitioner

## 2018-04-30 DIAGNOSIS — K5792 Diverticulitis of intestine, part unspecified, without perforation or abscess without bleeding: Secondary | ICD-10-CM

## 2018-04-30 MED ORDER — CIPROFLOXACIN HCL 500 MG PO TABS: 500.0000 mg | ORAL_TABLET | Freq: Two times a day (BID) | ORAL | 0 refills | Status: DC

## 2018-04-30 MED ORDER — METRONIDAZOLE 500 MG PO TABS: 500.0000 mg | ORAL_TABLET | Freq: Three times a day (TID) | ORAL | 0 refills | Status: DC

## 2018-04-30 MED ORDER — FLUCONAZOLE 150 MG PO TABS: ORAL_TABLET | ORAL | 0 refills | Status: DC

## 2018-04-30 NOTE — Progress Notes (Signed)
Spoke with patient regarding her ABD ultrasound results, negative for acute abnormalities. Mild hepatic steatosis noted.  Pancreatic duct measures 4 mm, unchanged since the prior CT in 2014.  Discussed treating for Diverticulitis related to remaining symptoms with Cipro and Flagyl. Patient agrees to this plan.  If no improvement GI referral.  Contact office with new or worsening symptoms.  Garnet Sierras, NP [facility] 04/30/18

## 2018-05-03 NOTE — Progress Notes (Signed)
Amylase and lipase reviewed and discussed with patient.

## 2018-05-06 DIAGNOSIS — Z01419 Encounter for gynecological examination (general) (routine) without abnormal findings: Secondary | ICD-10-CM | POA: Diagnosis not present

## 2018-05-07 ENCOUNTER — Other Ambulatory Visit: Payer: Self-pay | Admitting: Adult Health Nurse Practitioner

## 2018-05-07 DIAGNOSIS — Z01419 Encounter for gynecological examination (general) (routine) without abnormal findings: Secondary | ICD-10-CM | POA: Diagnosis not present

## 2018-05-07 DIAGNOSIS — K5792 Diverticulitis of intestine, part unspecified, without perforation or abscess without bleeding: Secondary | ICD-10-CM

## 2018-05-07 DIAGNOSIS — R1084 Generalized abdominal pain: Secondary | ICD-10-CM

## 2018-05-07 NOTE — Progress Notes (Signed)
Patient contact.  Reports she is still having abdominal bloating with associated pain.  Reports some improvement.  Will do GI referral as previously discussed.  Patient would like [facility].  Garnet Sierras, NP

## 2018-05-11 ENCOUNTER — Ambulatory Visit
Admission: RE | Admit: 2018-05-11 | Discharge: 2018-05-11 | Disposition: A | Discharge status: Home / Self Care | Payer: 59 | Source: Ambulatory Visit | Attending: Obstetrics and Gynecology | Admitting: Obstetrics and Gynecology

## 2018-05-11 DIAGNOSIS — Z1231 Encounter for screening mammogram for malignant neoplasm of breast: Secondary | ICD-10-CM

## 2018-05-12 ENCOUNTER — Other Ambulatory Visit: Payer: Self-pay

## 2018-05-12 MED ORDER — LOSARTAN POTASSIUM-HCTZ 100-25 MG PO TABS: ORAL_TABLET | ORAL | 0 refills | Status: DC

## 2018-05-13 ENCOUNTER — Telehealth: Payer: Self-pay | Admitting: Physician Assistant

## 2018-05-13 MED ORDER — PLECANATIDE 3 MG PO TABS: 3.0000 mg | ORAL_TABLET | Freq: Every day | ORAL | 3 refills | Status: DC

## 2018-05-13 NOTE — Telephone Encounter (Signed)
-----   Message from Elenor Quinones, [place] sent at 05/12/2018  4:22 PM EST ----- Regarding: med request Pt reports TRULANCE samples are working and pt would like an RX for the med.  Please and thank you  Pharmacy: [facility]/ [address]

## 2018-05-23 ENCOUNTER — Other Ambulatory Visit: Payer: Self-pay | Admitting: Physician Assistant

## 2018-05-27 ENCOUNTER — Ambulatory Visit (INDEPENDENT_AMBULATORY_CARE_PROVIDER_SITE_OTHER): Payer: 59 | Admitting: Physician Assistant

## 2018-05-27 ENCOUNTER — Other Ambulatory Visit (INDEPENDENT_AMBULATORY_CARE_PROVIDER_SITE_OTHER): Payer: 59

## 2018-05-27 ENCOUNTER — Encounter: Payer: Self-pay | Admitting: Physician Assistant

## 2018-05-27 VITALS — BP 120/70 | HR 103 | Ht 60.0 in | Wt 180.0 lb

## 2018-05-27 DIAGNOSIS — K5909 Other constipation: Secondary | ICD-10-CM

## 2018-05-27 DIAGNOSIS — K5792 Diverticulitis of intestine, part unspecified, without perforation or abscess without bleeding: Secondary | ICD-10-CM

## 2018-05-27 DIAGNOSIS — K760 Fatty (change of) liver, not elsewhere classified: Secondary | ICD-10-CM

## 2018-05-27 DIAGNOSIS — R932 Abnormal findings on diagnostic imaging of liver and biliary tract: Secondary | ICD-10-CM

## 2018-05-27 DIAGNOSIS — R1032 Left lower quadrant pain: Secondary | ICD-10-CM | POA: Diagnosis not present

## 2018-05-27 LAB — BUN: BUN: 17 mg/dL (ref 6–23)

## 2018-05-27 LAB — CREATININE, SERUM: Creatinine, Ser: 0.66 mg/dL (ref 0.40–1.20)

## 2018-05-27 NOTE — Progress Notes (Signed)
Subjective:    Patient ID: Kelly Goodwin, female    DOB: July 19, 1960, 58 y.o.   MRN: [id number]  HPI Kelly Goodwin is a pleasant 58 year old African-American female, known previously to Dr. Delfin Edis who was last seen in our office in 2014.  She comes in today referred by her PCP/Amanda Silverio Lay, PA-C after being treated recently for diverticulitis. Patient has history of IBS, diverticulosis with prior diverticulitis, PCOS, obesity, endometriosis, GERD.  She had undergone colonoscopy per Dr. Olevia Perches in 2012 which showed mild sigmoid diverticulosis, no polyps.  She was seen here in 2014 and treated for diverticulitis. Patient says her current illness started in December 2019 with onset of diarrhea then nausea and vomiting and fatigue.  Patient says she thought initially she had a virus but symptoms lingered and then within a couple of weeks she had pain that settled into her left side.  Also had ongoing problems with intermittent abdominal bloating.  And says that over the past year she is not had any solid stools.  On further questioning she has been taking MiraLAX 17 g in 8 ounces of water every day which produces liquid stool every couple of days.  She says if she does not take the MiraLAX she will go at all.  She has tried multiple over-the-counter laxatives etc. without any benefit in the past is afraid not to take the MiraLAX for fear of severe constipation. She was seen by primary care in early January underwent upper abdominal ultrasound which showed steatosis, CBD 6 mm, pancreatic duct of 4 mm slightly prominent but unchanged since prior CT scan. Pelvic ultrasound 31 2019 showed a hypoechoic mass in the right uterine wall 2.7 x 2.6 x 2.3 consistent with a fibroid.  She also had thyroid ultrasound. Lipid panel in December with cholesterol of 214 triglycerides 132, CBC c-Met unremarkable SH within normal limits. After ultrasounds were done she was then treated empirically for diverticulitis with a  course of Cipro and Flagyl which she has completed.  At this point the lower abdominal discomfort is almost completely resolved.  Nausea and vomiting has been resolved for some time and she is back to her usual stool pattern of liquid stool every 2 days with daily use of MiraLAX. She is concerned about the findings on recent ultrasounds.  Review of Systems Pertinent positive and negative review of systems were noted in the above HPI section.  All other review of systems was otherwise negative.  Outpatient Encounter Medications as of 05/27/2018  Medication Sig  . AMABELZ 1-0.5 MG tablet TAKE 1 TABLET BY MOUTH  DAILY  . Aspirin (STANBACK HEADACHE POWDER PO) Take by mouth.  Marland Kitchen CALCIUM-VITAMIN D PO Take 1 tablet by mouth daily.  Marland Kitchen GARLIC PO Take 1 tablet by mouth 2 (two) times daily.    Marland Kitchen Hyoscyamine Sulfate SL (LEVSIN/SL) 0.125 MG SUBL Place 0.125 mg under the tongue every 4 (four) hours as needed (diarrhea/AB cramping).  . INVOKANA 300 MG TABS tablet TAKE 1 TABLET BY MOUTH EVERY DAY  . levothyroxine (SYNTHROID, LEVOTHROID) 100 MCG tablet TAKE 1 TABLET BY MOUTH  EVERY MORNING ON AN EMPTY  STOMACH 30 MINUTES BEFORE  BREAKFAST  . losartan-hydrochlorothiazide (HYZAAR) 100-25 MG tablet Take 1 tablet daily for BP  . LYSINE PO Take 1 tablet by mouth at bedtime.    . metFORMIN (GLUCOPHAGE) 1000 MG tablet TAKE 1 TABLET BY MOUTH  TWICE A DAY FOR DIABETES  . Multiple Vitamins-Minerals (MULTIVITAMIN WITH MINERALS) tablet Take 1 tablet by mouth  daily.    . pantoprazole (PROTONIX) 40 MG tablet TAKE 1 TABLET BY MOUTH  DAILY  . temazepam (RESTORIL) 30 MG capsule TAKE 1 CAPSULE BY MOUTH AT BEDTIME ONLY NEEDED FOR INSOMNIA AND TRY TO LIMIT TO 5 PER WEEK  . valACYclovir (VALTREX) 500 MG tablet TAKE 1 TABLET BY MOUTH TWO  TIMES DAILY AS NEEDED  . [DISCONTINUED] ciprofloxacin (CIPRO) 500 MG tablet Take 1 tablet (500 mg total) by mouth 2 (two) times daily.  . [DISCONTINUED] fluconazole (DIFLUCAN) 150 MG tablet Take 1  tablet (150 mg total) by mouth daily.  . [DISCONTINUED] fluconazole (DIFLUCAN) 150 MG tablet Take one tablet by mouth at onset of yeast symptoms x5.  . [DISCONTINUED] meclizine (ANTIVERT) 25 MG tablet 1/2-1 pill up to 3 times daily for motion sickness/dizziness  . [DISCONTINUED] metroNIDAZOLE (FLAGYL) 500 MG tablet Take 1 tablet (500 mg total) by mouth 3 (three) times daily.  . [DISCONTINUED] ondansetron (ZOFRAN) 4 MG tablet Take 1 tablet (4 mg total) by mouth daily as needed for nausea or vomiting (Not sedating).  . [DISCONTINUED] Plecanatide (TRULANCE) 3 MG TABS Take 3 mg by mouth daily.  . [DISCONTINUED] promethazine-dextromethorphan (PROMETHAZINE-DM) 6.25-15 MG/5ML syrup Take 5 mLs by mouth 4 (four) times daily as needed for cough.  . [DISCONTINUED] scopolamine (TRANSDERM-SCOP, 1.5 MG,) 1 MG/3DAYS Place 1 patch (1.5 mg total) onto the skin every 3 (three) days.   No facility-administered encounter medications on file as of 05/27/2018.    No Known Allergies Patient Active Problem List   Diagnosis Date Noted  . Vitamin D deficiency 09/27/2014  . Medication management 09/27/2014  . Morbid obesity ([place]) 09/27/2014  . Endometriosis 09/27/2014  . Essential hypertension 07/05/2014  . Hyperlipidemia   . PCOS (polycystic ovarian syndrome)   . Migraine   . Arthritis   . Prediabetes   . Irritable bowel syndrome (IBS) 09/01/2013  . Diverticulosis 06/05/2011  . Hypothyroid 06/05/2011   Social History   Socioeconomic History  . Marital status: Married    Spouse name: Not on file  . Number of children: 0  . Years of education: Not on file  . Highest education level: Not on file  Occupational History  . Occupation: Producer, television/film/video: [employer]  . Financial resource strain: Not on file  . Food insecurity:    Worry: Not on file    Inability: Not on file  . Transportation needs:    Medical: Not on file    Non-medical: Not on file  Tobacco Use  . Smoking status:  Never Smoker  . Smokeless tobacco: Never Used  Substance and Sexual Activity  . Alcohol use: Yes    Comment: rarely  . Drug use: No  . Sexual activity: Not on file  Lifestyle  . Physical activity:    Days per week: Not on file    Minutes per session: Not on file  . Stress: Not on file  Relationships  . Social connections:    Talks on phone: Not on file    Gets together: Not on file    Attends religious service: Not on file    Active member of club or organization: Not on file    Attends meetings of clubs or organizations: Not on file    Relationship status: Not on file  . Intimate partner violence:    Fear of current or ex partner: Not on file    Emotionally abused: Not on file    Physically abused: Not on  file    Forced sexual activity: Not on file  Other Topics Concern  . Not on file  Social History Narrative   Caffeine use 2 a week       Ms. Streicher's family history includes COPD in her mother; Diabetes in her mother and sister; Diverticulitis in her mother; Heart disease in her mother; Leukemia in her mother; Lung cancer in her mother.      Objective:    Vitals:   05/27/18 1457  BP: 120/70  Pulse: (!) 103    Physical Exam; well-developed older African-American female in no acute distress, pleasant, height 5 foot, weight 180, BMI 35.1.  HEENT; nontraumatic, normocephalic EOM,I PERRLA sclera anicteric oral mucosa moist.  Cardiovascular; regular rate and rhythm with S1-S2 no murmur rub or gallop, Pulmonary ;clear bilaterally, Abdomen; soft, bowel sounds are present, no palpable mass or hepatosplenomegaly, she has some mild tenderness in the left lower quadrant no guarding or rebound.  Rectal; exam not done, Extremities; no clubbing cyanosis or edema skin warm and dry, Neuropsych; alert and oriented, grossly nonfocal mood and affect appropriate       Assessment & Plan:   #84 58 year old African-American female with acute illness onset December 2019 with nausea  vomiting abdominal discomfort diarrhea and fatigue.  I suspect initial onset was a viral gastroenteritis.  Subsequently she developed left lower quadrant pain which resolved after an empiric course of Cipro and Flagyl for diverticulitis. Patient has prior history of documented diverticulosis and has been treated in the past for diverticulitis.  #2 history of chronic constipation-which has been somewhat difficult to manage.  Patient is currently taking MiraLAX 1 dose daily which results in liquid stool.  She is still not sure she is evacuating her bowel. #3 history of IBS #4 hepatic steatosis on recent ultrasound-patient had increased risk for fatty liver with history of obesity and PCOS. #5 minimally prominent pancreatic duct-etiology not clear #6 colon cancer screening-up-to-date with negative colonoscopy 2012 #7 obesity #8 PCOS #9 GERD  Plan; Long discussion with patient today, and review of labs and recent imaging. Check hepatitis B and C serologies. Schedule for CT scan of the abdomen and pelvis with contrast. Patient had been given samples of Trulance by PCP, but had not started.  Advised to go ahead and trial theTruLance 3 mg daily in place of MiraLAX.  If she finds this effective without excessive diarrhea she will call back and we can send prescription.  If ineffective she will go back to MiraLAX 17 g in 8 ounces of water daily.   Patient would like to be established with a female gastroenterologist, will establish with Dr. Tarri Glenn. We discussed initial management of fatty liver disease with a low-fat diet, regular exercise, whole of 5 to 7% weight loss, avoidance of EtOH.  Depending on results of CT benefit from ultrasound with elastography to assess for degree of fibrosis.  Delayne Sanzo S Deriona Altemose PA-C 05/27/2018   Cc: Vicie Mutters, PA-C

## 2018-05-27 NOTE — Patient Instructions (Addendum)
Your provider has requested that you go to the basement level for lab work before leaving today. Press "B" on the elevator. The lab is located at the first door on the left as you exit the elevator.  Try Tulance samples instead of Miralax, if this helps, call back for a prescription. Regular exercise. Weight loss. Avoid alcohol.  You have been scheduled for a CT scan of the abdomen and pelvis at [facility] ([address]---this is in the same building as [facility]).   You are scheduled on Wed 06-09-18 at 3:00 PM. You should arrive at 2:45 PM  prior to your appointment time for registration. Please follow the written instructions below on the day of your exam:  WARNING: IF YOU ARE ALLERGIC TO IODINE/X-RAY DYE, PLEASE NOTIFY RADIOLOGY IMMEDIATELY AT [phone]! YOU WILL BE GIVEN A 13 HOUR PREMEDICATION PREP.  1) Do not eat  anything after 11:00 am (4 hours prior to your test) You can have coffee, water, tea prior to the test. 2) You have been given 2 bottles of oral contrast to drink. The solution may taste better if refrigerated, but do NOT add ice or any other liquid to this solution. Shake well before drinking.    Drink 1 bottle of contrast @ 1:00 PM (2 hours prior to your exam)  Drink 1 bottle of contrast @ 2:00 PM (1 hour prior to your exam)  You may take any medications as prescribed with a small amount of water, if necessary. If you take any of the following medications: METFORMIN, GLUCOPHAGE, GLUCOVANCE, AVANDAMET, RIOMET, FORTAMET, [place] MET, JANUMET, GLUMETZA or METAGLIP, you MAY be asked to HOLD this medication 48 hours AFTER the exam.  The purpose of you drinking the oral contrast is to aid in the visualization of your intestinal tract. The contrast solution may cause some diarrhea. Depending on your individual set of symptoms, you may also receive an intravenous injection of x-ray contrast/dye. Plan on being at [facility] for 30 minutes or longer,  depending on the type of exam you are having performed.  This test typically takes 30-45 minutes to complete.  If you have any questions regarding your exam or if you need to reschedule, you may call the CT department at [phone] between the hours of 8:00 am and 5:00 pm, Monday-Friday.  ________________________________________________________________________

## 2018-05-28 ENCOUNTER — Encounter: Payer: Self-pay | Admitting: Physician Assistant

## 2018-05-28 DIAGNOSIS — K760 Fatty (change of) liver, not elsewhere classified: Secondary | ICD-10-CM | POA: Insufficient documentation

## 2018-05-28 LAB — HEPATITIS C ANTIBODY
Hepatitis C Ab: NONREACTIVE
SIGNAL TO CUT-OFF: 0.01 (ref <1.00)

## 2018-05-28 LAB — HEPATITIS B SURFACE ANTIGEN: Hepatitis B Surface Ag: NONREACTIVE

## 2018-05-28 LAB — HEPATITIS B SURFACE ANTIBODY,QUALITATIVE: Hep B S Ab: NONREACTIVE

## 2018-05-31 NOTE — Progress Notes (Signed)
Reviewed. I agree with documentation including the assessment and plan.  Saxton Chain L. Shronda Boeh, MD, MPH 

## 2018-06-09 ENCOUNTER — Ambulatory Visit (INDEPENDENT_AMBULATORY_CARE_PROVIDER_SITE_OTHER)
Admission: RE | Admit: 2018-06-09 | Discharge: 2018-06-09 | Disposition: A | Discharge status: Home / Self Care | Payer: 59 | Source: Ambulatory Visit | Attending: Physician Assistant | Admitting: Physician Assistant

## 2018-06-09 DIAGNOSIS — R932 Abnormal findings on diagnostic imaging of liver and biliary tract: Secondary | ICD-10-CM | POA: Diagnosis not present

## 2018-06-09 DIAGNOSIS — K760 Fatty (change of) liver, not elsewhere classified: Secondary | ICD-10-CM

## 2018-06-09 DIAGNOSIS — R1032 Left lower quadrant pain: Secondary | ICD-10-CM | POA: Diagnosis not present

## 2018-06-09 DIAGNOSIS — K5792 Diverticulitis of intestine, part unspecified, without perforation or abscess without bleeding: Secondary | ICD-10-CM

## 2018-06-09 DIAGNOSIS — R1110 Vomiting, unspecified: Secondary | ICD-10-CM | POA: Diagnosis not present

## 2018-06-09 DIAGNOSIS — R197 Diarrhea, unspecified: Secondary | ICD-10-CM | POA: Diagnosis not present

## 2018-06-09 DIAGNOSIS — K5909 Other constipation: Secondary | ICD-10-CM

## 2018-06-09 MED ORDER — IOPAMIDOL (ISOVUE-300) INJECTION 61%
100.0000 mL | Freq: Once | INTRAVENOUS | Status: AC | PRN
  Administered 2018-06-09: 100 mL via INTRAVENOUS

## 2018-06-11 ENCOUNTER — Encounter: Payer: Self-pay | Admitting: Physician Assistant

## 2018-06-11 DIAGNOSIS — I700 Atherosclerosis of aorta: Secondary | ICD-10-CM | POA: Insufficient documentation

## 2018-06-26 ENCOUNTER — Other Ambulatory Visit: Payer: Self-pay | Admitting: Physician Assistant

## 2018-07-13 ENCOUNTER — Other Ambulatory Visit: Payer: Self-pay | Admitting: Internal Medicine

## 2018-07-30 NOTE — Progress Notes (Signed)
Assessment and Plan:  Essential hypertension - continue medications, DASH diet, exercise and monitor at home. Call if greater than 130/80.   Class 1 obesity due to excess calories with serious comorbidity and body mass index (BMI) of 32.0 to 32.9 in adult Discussed disease progression and risks Discussed diet/exercise, weight management and risk modification Bring in journal food next visit- she is doing it but did not bring it in Exercising 4-5 days a week - insurance will not cover weight loss meds- will start on phentermine ? Sleep apnea  Hypothyroidism, unspecified type Hypothyroidism-check TSH level, continue medications the same, reminded to take on an empty stomach 30-51mins before food.  Going to stop biotin for 2 weeks and then recheck in 2 weeks Going to refer to endocrinology to discuss armour thyroid   Mixed hyperlipidemia -continue medications, check lipids, decrease fatty foods, increase activity.   Prediabetes  With weight gain will check A1C Cont. metformin   Future Appointments  Date Time Provider [place]  04/25/2019  2:00 PM Vicie Mutters, PA-C [facility] None     HPI 58 y.o.female presents for 3 month follow up.    Her blood pressure has been controlled at home, today their BP is BP: 124/76  She does not workout. She denies chest pain, shortness of breath, dizziness.  She is on cholesterol medication and denies myalgias. Her cholesterol is at goal. The cholesterol last visit was:   Lab Results  Component Value Date   CHOL 214 (H) 04/22/2018   HDL 58 04/22/2018   LDLCALC 131 (H) 04/22/2018   TRIG 132 04/22/2018   CHOLHDL 3.7 04/22/2018    She has been working on diet and exercise for diabetes but with diet and invokana she is not in the preDM range even, she is on metformin for PCOS and denies paresthesia of the feet, polydipsia, polyuria and visual disturbances. Last A1C in the office was:  Lab Results  Component Value Date   HGBA1C 5.6  04/22/2018   Patient is on Vitamin D supplement.   Lab Results  Component Value Date   VD25OH 111 (H) 04/22/2018    BMI is Body mass index is 35.54 kg/m., she is working on diet and exercise. Could not tolerate belviq due to HA, insurance would not cover contrave or qysmia.  Wt Readings from Last 3 Encounters:  08/02/18 182 lb (82.6 kg)  05/27/18 180 lb (81.6 kg)  04/27/18 180 lb (81.6 kg)    Past Medical History:  Diagnosis Date  . Arthritis   . Constipation, chronic   . Diverticulitis   . Diverticulosis   . Fibroid uterus   . History of gallstones   . Hyperlipidemia   . Hypertension   . Hypothyroidism   . Migraine   . PCOS (polycystic ovarian syndrome)    takes metformin for this  . Prediabetes   . Thyroid disease   . Type II or unspecified type diabetes mellitus without mention of complication, not stated as uncontrolled      No Known Allergies    Current Outpatient Medications on File Prior to Visit  Medication Sig Dispense Refill  . AMABELZ 1-0.5 MG tablet TAKE 1 TABLET BY MOUTH  DAILY 84 tablet 1  . Aspirin (STANBACK HEADACHE POWDER PO) Take by mouth.    Marland Kitchen CALCIUM-VITAMIN D PO Take 1 tablet by mouth daily.    Marland Kitchen GARLIC PO Take 1 tablet by mouth 2 (two) times daily.      Marland Kitchen Hyoscyamine Sulfate SL (LEVSIN/SL) 0.125  MG SUBL Place 0.125 mg under the tongue every 4 (four) hours as needed (diarrhea/AB cramping). 30 each 0  . INVOKANA 300 MG TABS tablet TAKE 1 TABLET BY MOUTH EVERY DAY 90 tablet 0  . levothyroxine (SYNTHROID, LEVOTHROID) 100 MCG tablet Take 1 tablet daily on an empty stomach with only water for 30 minutes & no Antacid meds, Calcium or Magnesium for 4 hours & avoid Biotin 90 tablet 0  . losartan-hydrochlorothiazide (HYZAAR) 100-25 MG tablet Take 1 tablet daily for BP 90 tablet 0  . LYSINE PO Take 1 tablet by mouth at bedtime.      . metFORMIN (GLUCOPHAGE) 1000 MG tablet TAKE 1 TABLET BY MOUTH  TWICE A DAY FOR DIABETES 180 tablet 0  . Multiple  Vitamins-Minerals (MULTIVITAMIN WITH MINERALS) tablet Take 1 tablet by mouth daily.      . pantoprazole (PROTONIX) 40 MG tablet TAKE 1 TABLET BY MOUTH  DAILY 90 tablet 1  . temazepam (RESTORIL) 30 MG capsule TAKE 1 CAPSULE BY MOUTH AT BEDTIME ONLY NEEDED FOR INSOMNIA AND TRY TO LIMIT TO 5 PER WEEK 30 capsule 2  . valACYclovir (VALTREX) 500 MG tablet TAKE 1 TABLET BY MOUTH TWO  TIMES DAILY AS NEEDED 60 tablet 0   No current facility-administered medications on file prior to visit.     ROS: all negative except above.   Physical Exam: Filed Weights   08/02/18 1334  Weight: 182 lb (82.6 kg)   BP 124/76   Pulse (!) 101   Temp (!) 97.5 F (36.4 C)   Ht 5' (1.524 m)   Wt 182 lb (82.6 kg)   SpO2 97%   BMI 35.54 kg/m  General Appearance: Well nourished, in no apparent distress. Eyes: PERRLA, EOMs, conjunctiva no swelling or erythema Sinuses: No Frontal/maxillary tenderness ENT/Mouth: Ext aud canals clear, TMs without erythema, bulging. No erythema, swelling, or exudate on post pharynx.  Tonsils not swollen or erythematous. Hearing normal.  Neck: Supple, thyroid normal.  Respiratory: Respiratory effort normal, BS equal bilaterally without rales, rhonchi, wheezing or stridor.  Cardio: RRR with no MRGs. Brisk peripheral pulses without edema.  Abdomen: Soft, + BS.  Non tender, no guarding, rebound, hernias, masses. Lymphatics: Non tender without lymphadenopathy.  Musculoskeletal: Full ROM, 5/5 strength, normal gait.  Skin: Warm, dry without rashes, lesions, ecchymosis.  Neuro: Cranial nerves intact. Normal muscle tone, no cerebellar symptoms. Sensation intact.  Psych: Awake and oriented X 3, normal affect, tearful, Insight and Judgment appropriate.     Vicie Mutters, PA-C 1:52 PM [facility]

## 2018-08-02 ENCOUNTER — Ambulatory Visit (INDEPENDENT_AMBULATORY_CARE_PROVIDER_SITE_OTHER): Payer: 59 | Admitting: Physician Assistant

## 2018-08-02 ENCOUNTER — Other Ambulatory Visit: Payer: Self-pay

## 2018-08-02 ENCOUNTER — Ambulatory Visit: Payer: Self-pay | Admitting: Physician Assistant

## 2018-08-02 ENCOUNTER — Encounter: Payer: Self-pay | Admitting: Physician Assistant

## 2018-08-02 VITALS — BP 124/76 | HR 101 | Temp 97.5°F | Ht 60.0 in | Wt 182.0 lb

## 2018-08-02 DIAGNOSIS — Z79899 Other long term (current) drug therapy: Secondary | ICD-10-CM

## 2018-08-02 DIAGNOSIS — E782 Mixed hyperlipidemia: Secondary | ICD-10-CM | POA: Diagnosis not present

## 2018-08-02 DIAGNOSIS — E039 Hypothyroidism, unspecified: Secondary | ICD-10-CM | POA: Diagnosis not present

## 2018-08-02 DIAGNOSIS — I10 Essential (primary) hypertension: Secondary | ICD-10-CM | POA: Diagnosis not present

## 2018-08-02 DIAGNOSIS — R7309 Other abnormal glucose: Secondary | ICD-10-CM

## 2018-08-02 NOTE — Patient Instructions (Addendum)
Going to stop biotin for 2 weeks and then recheck in 2 weeks Going to refer to endocrinology to discuss armour thyroid, we do not prescribe that here due to everything listed below.  This is outside my scope and I prefer if you see a board certifited  Why we do not prescribe Armour Thyroid: 1) Armour is purified porcine (pig) thyroid glands, which is NOT without risk for contaminants.  2) The ratio between T3 and T4 in Armour thyroid is physiologic for pigs, NOT for humans.  3) The short half life of T3 can cause fluctuations in blood levels, which can result in mood swings and heart rhythm abnormalities.  4) the concentration of the active substances (T4 and T3) can be expected to vary between different Armour lots, which can cause variation in the thyroid function tests.   Fatty liver or Nonalcoholic fatty liver disease (NASH)  Now the leading cause of liver failure in the [place].  It is normally from such risk factors as obesity, diabetes, insulin resistance, high cholesterol, or metabolic syndrome.  The only definitive therapy is weight loss and exercise.    Suggest walking 20-30 mins daily.  Decreasing carbohydrates, increasing veggies.  Vitamin E 800 IU a day may be beneficial. Liver cancer has been noted in patient with fatty liver without cirrhosis.  Will monitor closely   Fatty Liver Fatty liver is the accumulation of fat in liver cells. It is also called hepatosteatosis or steatohepatitis. It is normal for your liver to contain some fat. If fat is more than 5 to 10% of your liver's weight, you have fatty liver.  There are often no symptoms (problems) for years while damage is still occurring. People often learn about their fatty liver when they have medical tests for other reasons. Fat can damage your liver for years or even decades without causing problems. When it becomes severe, it can cause fatigue, weight loss, weakness, and confusion. This makes you more likely to  develop more serious liver problems. The liver is the largest organ in the body. It does a lot of work and often gives no warning signs when it is sick until late in a disease. The liver has many important jobs including:  Breaking down foods.  Storing vitamins, iron, and other minerals.  Making proteins.  Making bile for food digestion.  Breaking down many products including medications, alcohol and some poisons.  PROGNOSIS  Fatty liver may cause no damage or it can lead to an inflammation of the liver. This is, called steatohepatitis.  Over time the liver may become scarred and hardened. This condition is called cirrhosis. Cirrhosis is serious and may lead to liver failure or cancer. NASH is one of the leading causes of cirrhosis. About 10-20% of Americans have fatty liver and a smaller 2-5% has NASH.  TREATMENT   Weight loss, fat restriction, and exercise in overweight patients produces inconsistent results but is worth trying.  Good control of diabetes may reduce fatty liver.  Eat a balanced, healthy diet.  Increase your physical activity.  There are no medical or surgical treatments for a fatty liver or NASH, but improving your diet and increasing your exercise may help prevent or reverse some of the damage.   Atherosclerosis  Atherosclerosis is narrowing and hardening of the arteries. Arteries are blood vessels that carry blood from the heart to all parts of the body. This blood contains oxygen. Arteries can become narrow or clogged with a buildup of fat, cholesterol, calcium,  and other substances (plaque). Plaque decreases the amount of blood that can flow through the artery. Atherosclerosis can affect any artery in the body, including:  Heart arteries (coronary artery disease). This may cause a heart attack.  Brain arteries. This may cause a stroke (cerebrovascular accident).  Leg, arm, and pelvis arteries (peripheral artery disease). This may cause pain and  numbness.  Kidney arteries. This may cause kidney (renal) failure. Treatment may slow the disease and prevent further damage to the heart, brain, peripheral arteries, and kidneys. What are the causes? Atherosclerosis develops slowly over many years. The inner layers of your arteries become damaged and allow the gradual buildup of plaque. The exact cause of atherosclerosis is not fully understood. Symptoms of atherosclerosis do not occur until the artery becomes narrow or blocked. What increases the risk? The following factors may make you more likely to develop this condition:  High blood pressure.  High cholesterol.  Being middle-aged or older.  Having a family history of atherosclerosis.  Having high blood fats (triglycerides).  Diabetes.  Being overweight.  Smoking tobacco.  Not exercising enough (sedentary lifestyle).  Having a substance in the blood called C-reactive protein (CRP). This is a sign of increased levels of inflammation in the body.  Sleep apnea.  Being stressed.  Drinking too much alcohol. What are the signs or symptoms? This condition may not cause any symptoms. If you have symptoms, they are caused by damage to an area of your body that is not getting enough blood.  Coronary artery disease may cause chest pain and shortness of breath.  Decreased blood supply to your brain may cause a stroke. Signs of a stroke may include sudden: ? Weakness on one side of the body. ? Confusion. ? Changes in vision. ? Inability to speak or understand speech. ? Loss of balance, coordination, or the ability to walk. ? Severe headache. ? Loss of consciousness.  Peripheral arterial disease may cause pain and numbness, often in the legs and hips.  Renal failure may cause fatigue, nausea, swelling, and itchy skin. How is this diagnosed? This condition is diagnosed based on your medical history and a physical exam. During the exam:  Your health care provider  will: ? Check your pulse in different places. ? Listen for a "whooshing" sound over your arteries (bruit).  You may have tests, such as: ? Blood tests to check your levels of cholesterol, triglycerides, and CRP. ? Electrocardiogram (ECG) to check for heart damage. ? Chest X-ray to see if you have an enlarged heart, which is a sign of heart failure. ? Stress test to see how your heart reacts to exercise. ? Echocardiogram to get images of the inside of your heart. ? Ankle-brachial index to compare blood pressure in your arms to blood pressure in your ankles. ? Ultrasound of your peripheral arteries to check blood flow. ? CT scan to check for damage to your heart or brain. ? X-rays of blood vessels after dye has been injected (angiogram) to check blood flow. How is this treated? Treatment starts with lifestyle changes, which may include:  Changing your diet.  Losing weight.  Reducing stress.  Exercising and being physically active more regularly.  Not smoking. You may also need medicine to:  Lower triglycerides and cholesterol.  Control blood pressure.  Prevent blood clots.  Lower inflammation in your body.  Control your blood sugar. Sometimes, surgery is needed to:  Remove plaque from an artery (endarterectomy).  Open or widen a narrowed heart artery (  angioplasty).  Create a new path for your blood with one of these procedures: ? Heart (coronary) artery bypass graft surgery. ? Peripheral artery bypass graft surgery. Follow these instructions at home: Eating and drinking   Eat a heart-healthy diet. Talk with your health care provider or a diet and nutrition specialist (dietitian) if you need help. A heart-healthy diet involves: ? Limiting unhealthy fats and increasing healthy fats. Some examples of healthy fats are olive oil and canola oil. ? Eating plant-based foods, such as fruits, vegetables, nuts, whole grains, and legumes (such as peas and lentils).  Limit  alcohol intake to no more than 1 drink a day for nonpregnant women and 2 drinks a day for men. One drink equals 12 oz of beer, 5 oz of wine, or 1 oz of hard liquor. Lifestyle  Follow an exercise program as told by your health care provider.  Maintain a healthy weight. Lose weight if your health care provider says that you need to do that.  Rest when you are tired.  Learn to manage your stress.  Do not use any products that contain nicotine or tobacco, such as cigarettes and e-cigarettes. If you need help quitting, ask your health care provider.  Do not abuse drugs. General instructions  Take over-the-counter and prescription medicines only as told by your health care provider.  Manage other health conditions as told by your health care provider.  Keep all follow-up visits as told by your health care provider. This is important. Contact a health care provider if:  You have chest pain or discomfort. This includes squeezing chest pain that may feel like indigestion (angina).  You have shortness of breath.  You have an irregular heartbeat.  You have unexplained fatigue.  You have unexplained pain or numbness in an arm, leg, or hip.  You have nausea, swelling of your hands or feet, and itchy skin. Get help right away if:  You have any symptoms of a heart attack, such as: ? Chest pain. ? Shortness of breath. ? Pain in your neck, jaw, arms, back, or stomach. ? Cold sweat. ? Nausea. ? Light-headedness.  You have any symptoms of a stroke. "BE FAST" is an easy way to remember the main warning signs of a stroke: ? B - Balance. Signs are dizziness, sudden trouble walking, or loss of balance. ? E - Eyes. Signs are trouble seeing or a sudden change in vision. ? F - Face. Signs are sudden weakness or numbness of the face, or the face or eyelid drooping on one side. ? A - Arms. Signs are weakness or numbness in an arm. This happens suddenly and usually on one side of the body. ? S -  Speech. Signs are sudden trouble speaking, slurred speech, or trouble understanding what people say. ? T - Time. Time to call emergency services. Write down what time symptoms started.  You have other signs of a stroke, such as: ? A sudden, severe headache with no known cause. ? Nausea or vomiting. ? Seizure. These symptoms may represent a serious problem that is an emergency. Do not wait to see if the symptoms will go away. Get medical help right away. Call your local emergency services (911 in the [place] Do not drive yourself to the hospital. Summary  Atherosclerosis is narrowing and hardening of the arteries.  Arteries can become narrow or clogged with a buildup of fat, cholesterol, calcium, and other substances (plaque).  This condition may not cause any symptoms. If you do  have symptoms, they are caused by damage to an area of your body that is not getting enough blood.  Treatment may include lifestyle changes and medicines. In some cases, surgery is needed. This information is not intended to replace advice given to you by your health care provider. Make sure you discuss any questions you have with your health care provider. Document Released: 07/05/2003 Document Revised: 07/24/2017 Document Reviewed: 12/18/2016 Elsevier Interactive Patient Education  2019 [employer].

## 2018-08-07 ENCOUNTER — Other Ambulatory Visit: Payer: Self-pay | Admitting: Physician Assistant

## 2018-08-17 ENCOUNTER — Other Ambulatory Visit: Payer: Self-pay | Admitting: Physician Assistant

## 2018-08-18 ENCOUNTER — Other Ambulatory Visit: Payer: 59

## 2018-08-18 ENCOUNTER — Other Ambulatory Visit: Payer: Self-pay

## 2018-08-18 DIAGNOSIS — Z79899 Other long term (current) drug therapy: Secondary | ICD-10-CM

## 2018-08-18 DIAGNOSIS — E782 Mixed hyperlipidemia: Secondary | ICD-10-CM

## 2018-08-18 DIAGNOSIS — I10 Essential (primary) hypertension: Secondary | ICD-10-CM

## 2018-08-18 DIAGNOSIS — R7309 Other abnormal glucose: Secondary | ICD-10-CM

## 2018-08-18 DIAGNOSIS — E039 Hypothyroidism, unspecified: Secondary | ICD-10-CM

## 2018-08-19 LAB — LIPID PANEL
Cholesterol: 160 mg/dL (ref <200)
HDL: 50 mg/dL (ref >=50)
LDL Cholesterol (Calc): 93 mg/dL (calc)
Non-HDL Cholesterol (Calc): 110 mg/dL (calc) (ref <130)
Total CHOL/HDL Ratio: 3.2 (calc) (ref <5.0)
Triglycerides: 84 mg/dL (ref <150)

## 2018-08-19 LAB — COMPLETE METABOLIC PANEL WITH GFR
AG Ratio: 1.5 (calc) (ref 1.0–2.5)
ALT: 10 U/L (ref 6–29)
AST: 10 U/L (ref 10–35)
Albumin: 3.9 g/dL (ref 3.6–5.1)
Alkaline phosphatase (APISO): 55 U/L (ref 37–153)
BUN: 9 mg/dL (ref 7–25)
CO2: 28 mmol/L (ref 20–32)
Calcium: 9.5 mg/dL (ref 8.6–10.4)
Chloride: 105 mmol/L (ref 98–110)
Creat: 0.65 mg/dL (ref 0.50–1.05)
GFR, Est African American: 113 mL/min/{1.73_m2} (ref >=60)
GFR, Est Non African American: 98 mL/min/{1.73_m2} (ref >=60)
Globulin: 2.6 g/dL (calc) (ref 1.9–3.7)
Glucose, Bld: 97 mg/dL (ref 65–99)
Potassium: 3.5 mmol/L (ref 3.5–5.3)
Sodium: 140 mmol/L (ref 135–146)
Total Bilirubin: 0.2 mg/dL (ref 0.2–1.2)
Total Protein: 6.5 g/dL (ref 6.1–8.1)

## 2018-08-19 LAB — HEMOGLOBIN A1C
Hgb A1c MFr Bld: 5.6 % of total Hgb (ref <5.7)
Mean Plasma Glucose: 114 (calc)
eAG (mmol/L): 6.3 (calc)

## 2018-08-19 LAB — CBC WITH DIFFERENTIAL/PLATELET
Absolute Monocytes: 764 cells/uL (ref 200–950)
Basophils Absolute: 78 cells/uL (ref 0–200)
Basophils Relative: 0.8 %
Eosinophils Absolute: 353 cells/uL (ref 15–500)
Eosinophils Relative: 3.6 %
HCT: 36 % (ref 35.0–45.0)
Hemoglobin: 11.9 g/dL (ref 11.7–15.5)
Lymphs Abs: 3205 cells/uL (ref 850–3900)
MCH: 29.2 pg (ref 27.0–33.0)
MCHC: 33.1 g/dL (ref 32.0–36.0)
MCV: 88.5 fL (ref 80.0–100.0)
MPV: 9.5 fL (ref 7.5–12.5)
Monocytes Relative: 7.8 %
Neutro Abs: 5400 cells/uL (ref 1500–7800)
Neutrophils Relative %: 55.1 %
Platelets: 388 10*3/uL (ref 140–400)
RBC: 4.07 10*6/uL (ref 3.80–5.10)
RDW: 13.2 % (ref 11.0–15.0)
Total Lymphocyte: 32.7 %
WBC: 9.8 10*3/uL (ref 3.8–10.8)

## 2018-08-19 LAB — MAGNESIUM: Magnesium: 1.9 mg/dL (ref 1.5–2.5)

## 2018-08-19 LAB — TSH: TSH: 0.91 mIU/L (ref 0.40–4.50)

## 2018-09-18 ENCOUNTER — Other Ambulatory Visit: Payer: Self-pay | Admitting: Internal Medicine

## 2018-09-18 DIAGNOSIS — R7303 Prediabetes: Secondary | ICD-10-CM

## 2018-09-18 MED ORDER — METFORMIN HCL 1000 MG PO TABS: ORAL_TABLET | ORAL | 1 refills | Status: DC

## 2018-09-21 ENCOUNTER — Ambulatory Visit: Payer: 59 | Admitting: Internal Medicine

## 2018-10-02 ENCOUNTER — Other Ambulatory Visit: Payer: Self-pay | Admitting: Physician Assistant

## 2018-10-05 ENCOUNTER — Ambulatory Visit: Payer: 59 | Admitting: Internal Medicine

## 2018-10-08 ENCOUNTER — Other Ambulatory Visit: Payer: Self-pay | Admitting: Adult Health

## 2018-10-15 ENCOUNTER — Other Ambulatory Visit: Payer: Self-pay | Admitting: Internal Medicine

## 2018-10-21 ENCOUNTER — Other Ambulatory Visit: Payer: Self-pay | Admitting: Physician Assistant

## 2018-10-26 ENCOUNTER — Other Ambulatory Visit: Payer: Self-pay | Admitting: Physician Assistant

## 2018-11-03 ENCOUNTER — Other Ambulatory Visit: Payer: Self-pay

## 2018-11-05 ENCOUNTER — Other Ambulatory Visit: Payer: Self-pay

## 2018-11-05 ENCOUNTER — Ambulatory Visit (INDEPENDENT_AMBULATORY_CARE_PROVIDER_SITE_OTHER): Payer: 59 | Admitting: Internal Medicine

## 2018-11-05 ENCOUNTER — Encounter: Payer: Self-pay | Admitting: Internal Medicine

## 2018-11-05 VITALS — BP 130/70 | HR 80 | Ht 60.0 in | Wt 187.0 lb

## 2018-11-05 DIAGNOSIS — E039 Hypothyroidism, unspecified: Secondary | ICD-10-CM

## 2018-11-05 LAB — T3, FREE: T3, Free: 3.1 pg/mL (ref 2.3–4.2)

## 2018-11-05 LAB — T4, FREE: Free T4: 1 ng/dL (ref 0.60–1.60)

## 2018-11-05 LAB — TSH: TSH: 0.76 u[IU]/mL (ref 0.35–4.50)

## 2018-11-05 MED ORDER — NP THYROID 90 MG PO TABS: 45.0000 mg | ORAL_TABLET | Freq: Every day | ORAL | 5 refills | Status: DC

## 2018-11-05 NOTE — Patient Instructions (Addendum)
Please stop at the lab.  For now, continue Levothyroxine 75 mcg daily.  Take the thyroid hormone every day, with water, at least 30 minutes before breakfast, separated by at least 4 hours from: - acid reflux medications - calcium - iron - multivitamins  Please come back for a follow-up appointment in 4 months.

## 2018-11-05 NOTE — Progress Notes (Addendum)
Patient ID: Kelly Goodwin, female   DOB: [birth year], 58 y.o.   MRN: [id number]    HPI  Kelly Goodwin is a 58 y.o.-year-old female, referred by her PCP, Vicie Mutters, PA-C, for management of hypothyroidism.  Pt. has been dx with hypothyroidism in 5885 (at 58 years old) >> on Levothyroxine 75 mcg (alternating 50 with 100 every other day).  She takes the thyroid hormone: - fasting - with water - separated by >30 min from b'fast  - no calcium, iron, PPIs, multivitamins  - not on biotin  I reviewed pt's thyroid tests: Lab Results  Component Value Date   TSH 0.91 08/18/2018   TSH 1.07 04/22/2018   TSH 1.03 08/11/2017   TSH 1.15 12/25/2016   TSH 1.15 12/13/2015   TSH 1.03 07/16/2015   TSH 1.060 02/27/2015   TSH 1.140 09/27/2014   TSH 0.937 07/05/2014   TSH 0.972 03/21/2014   Antithyroid antibodies: No results found for: THGAB No components found for: TPOAB  Pt describes: - + weight gain - truncal - + thinning hair - + brittle nails - + cold hands and feet - + Hot flashes - + fatigue  - + insomnia - sleeps 3-4h a night - no depression - + constipation - tried prunes, castor oil, laxatives, magnesium, Linzess >> more hair loss. Has diverticulitis >> sees GI. - + chin hair  Pt denies feeling nodules in neck, hoarseness, dysphagia/odynophagia, SOB with lying down.  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 No FH of thyroid cancer.  No h/o radiation tx to head or neck. No recent use of iodine supplements.  Pt. also has a history of PCOS, HTN, prediabetes >> on Metformin.   Reviewed previous HbA1c levels: Lab Results  Component Value Date   HGBA1C 5.6 08/18/2018   HGBA1C 5.6 04/22/2018   HGBA1C 5.2 08/11/2017   HGBA1C 5.5 12/25/2016   HGBA1C 5.1 12/13/2015   HGBA1C 5.2 07/16/2015   HGBA1C 5.4 02/27/2015   HGBA1C 5.5 09/27/2014   HGBA1C  5.6 07/05/2014   HGBA1C 5.5 03/21/2014   For exercise, she is doing Zumba and walking 2-3 times a week. For breakfast, she has bacon, eggs, cheese, biscuit or egg whites omelette.  Occasionally yogurt + blueberries.  She usually eats lunch and dinner out.  She has 1 snack a day.  ROS: Constitutional: + See HPI, + nocturia Eyes: + blurry vision, no xerophthalmia ENT: no sore throat, + see HPI Cardiovascular: no CP/SOB/palpitations/leg swelling Respiratory: no cough/SOB Gastrointestinal: no N/V/+ D/+ C Musculoskeletal: no muscle/joint aches Skin: no rashes, + blurry vision Neurological: no tremors/numbness/tingling/dizziness, + headache Psychiatric: no depression/anxiety  Past Medical History:  Diagnosis Date  . Arthritis   . Constipation, chronic   . Diverticulitis   . Diverticulosis   . Fibroid uterus   . History of gallstones   . Hyperlipidemia   . Hypertension   . Hypothyroidism   . Migraine   . PCOS (polycystic ovarian syndrome)    takes metformin for this  . Prediabetes   . Thyroid disease   . Type II or unspecified type diabetes mellitus without mention of complication, not stated as uncontrolled    Past Surgical History:  Procedure Laterality Date  . BREAST REDUCTION SURGERY  1988  . BREAST SURGERY    . CHOLECYSTECTOMY  1995  . EYE SURGERY    .  LASIK Left   . REDUCTION MAMMAPLASTY    . REFRACTIVE SURGERY Left   . TONSILLECTOMY AND ADENOIDECTOMY  1990's   Social History   Socioeconomic History  . Marital status: Married    Spouse name: Not on file  . Number of children: 0  . Years of education: Not on file  . Highest education level: Not on file  Occupational History  . Occupation: Producer, television/film/video: [employer]  . Financial resource strain: Not on file  . Food insecurity    Worry: Not on file    Inability: Not on file  . Transportation needs    Medical: Not on file    Non-medical: Not on file  Tobacco Use  . Smoking  status: Never Smoker  . Smokeless tobacco: Never Used  Substance and Sexual Activity  . Alcohol use: Yes    Comment: rarely  . Drug use: No  . Sexual activity: Not on file  Lifestyle  . Physical activity    Days per week: Not on file    Minutes per session: Not on file  . Stress: Not on file  Relationships  . Social Herbalist on phone: Not on file    Gets together: Not on file    Attends religious service: Not on file    Active member of club or organization: Not on file    Attends meetings of clubs or organizations: Not on file    Relationship status: Not on file  . Intimate partner violence    Fear of current or ex partner: Not on file    Emotionally abused: Not on file    Physically abused: Not on file    Forced sexual activity: Not on file  Other Topics Concern  . Not on file  Social History Narrative   Caffeine use 2 a week      Current Outpatient Medications on File Prior to Visit  Medication Sig Dispense Refill  . AMABELZ 1-0.5 MG tablet TAKE 1 TABLET BY MOUTH  DAILY 84 tablet 1  . CALCIUM-VITAMIN D PO Take 1 tablet by mouth daily.    Marland Kitchen GARLIC PO Take 1 tablet by mouth 2 (two) times daily.      Marland Kitchen levothyroxine (SYNTHROID) 100 MCG tablet Take 1 tablet daily on an empty stomach with only water for 30 minutes & no Antacid meds, Calcium or Magnesium for 4 hours & avoid Biotin 90 tablet 1  . losartan-hydrochlorothiazide (HYZAAR) 100-25 MG tablet TAKE 1 TABLET BY MOUTH DAILY FOR BLOOD PRESSURE 90 tablet 0  . LYSINE PO Take 1 tablet by mouth at bedtime.      . metFORMIN (GLUCOPHAGE) 1000 MG tablet Take 1 tablet 2 x /day with Meals for Diabetes 180 tablet 1  . Multiple Vitamins-Minerals (MULTIVITAMIN WITH MINERALS) tablet Take 1 tablet by mouth daily.      . pantoprazole (PROTONIX) 40 MG tablet Take 1 tablet Daily for Indigestion & Reflux 90 tablet 2  . temazepam (RESTORIL) 30 MG capsule TAKE ONE CAPSULE BY MOUTH EVERY NIGHT AT BEDTIME AS NEEDED FOR INSOMNIA. TRY  NOT TO TAKE MORE THAN 5 PER WEEK 30 capsule 1  . valACYclovir (VALTREX) 500 MG tablet TAKE 1 TABLET BY MOUTH TWO  TIMES DAILY AS NEEDED 60 tablet 0  . Aspirin (STANBACK HEADACHE POWDER PO) Take by mouth.    . INVOKANA 300 MG TABS tablet TAKE 1 TABLET BY MOUTH EVERY DAY (Patient not taking: Reported on  11/05/2018) 90 tablet 0   No current facility-administered medications on file prior to visit.    No Known Allergies Family History  Problem Relation Age of Onset  . Diabetes Mother   . Lung cancer Mother   . COPD Mother   . Diverticulitis Mother   . Leukemia Mother   . Heart disease Mother   . Diabetes Sister   . Colon cancer Neg Hx   . Breast cancer Neg Hx      PE: BP 130/70   Pulse 80   Ht 5' (1.524 m)   Wt 187 lb (84.8 kg)   SpO2 97%   BMI 36.52 kg/m  Wt Readings from Last 3 Encounters:  11/05/18 187 lb (84.8 kg)  08/02/18 182 lb (82.6 kg)  05/27/18 180 lb (81.6 kg)   Constitutional: overweight, in NAD Eyes: PERRLA, EOMI, no exophthalmos ENT: moist mucous membranes, no thyromegaly, no cervical lymphadenopathy Cardiovascular: RRR, No MRG Respiratory: CTA B Gastrointestinal: abdomen soft, NT, ND, BS+ Musculoskeletal: no deformities, strength intact in all 4 Skin: moist, warm, no rashes Neurological: no tremor with outstretched hands, DTR normal in all 4  ASSESSMENT: 1. Hypothyroidism - acquired  2.  Weight gain  PLAN:  1. Patient with long-standing hypothyroidism, on levothyroxine therapy. Her TFTs are normal, but she is interested in desiccated thyroid extract as she is unhappy that she cannot lose weight. - she appears euthyroid.  - she does not appear to have a goiter, thyroid nodules, or neck compression symptoms - We discussed about correct intake of thyroid hormones, fasting, with water, separated by at least 30 minutes from breakfast, and separated by more than 4 hours from calcium, iron, multivitamins, acid reflux medications (PPIs). - We can try  desiccated thyroid extract (NP or Armour thyroid) and we discussed about advantages and disadvantages of her regimen and includes both T4 and T3: Explained that Armour is purified from thyroid glands or takes and may cause fluctuations in her blood levels which may need to worsening of insomnia, however, we can definitely try this for her and see how she feels and we can always restart levothyroxine if not feeling better - will check thyroid tests today: TSH, free T4, free T3, and also had thyroid antibodies to investigate for Hashimoto's thyroiditis.  We discussed that Hashimoto's thyroiditis is an autoimmune disease and the most common cause of hypothyroidism in [place].  People with high thyroid antibodies may have hypothyroid symptoms evaluate the presence of normal TFTs.  If she is Hashimoto's thyroiditis, there are several things that she can try to improve her immune system and decrease the antibodies,  including improving her diet, reducing stress, sleeping well, exercising, and possibly also taking selenium. - If labs today are abnormal, she will need to return in ~5-6 weeks for repeat labs - Otherwise, I will see her back in 4 months  2.  Weight gain -Patient is frustrated that she is gaining weight and this is mostly truncal -We reviewed her diet which is poor, with a very high fat breakfast usually and eating out/fast food throughout the rest of the day.  We discussed that with his diet, I would not expect any other outcome.  I made specific suggestions about how to improve her meals and to start by eliminating the fatty foods in her diet.   -She may need a referral to the [facility] weight management clinic or to nutrition if she cannot adjust diet by herself -For now, we also decided to try to add  T3 to her regimen to see if this can help with her weight, but I feel that improving diet is key in her case.  Office Visit on 11/05/2018  Component Date Value Ref Range Status  . TSH 11/05/2018 0.76  0.35  - 4.50 uIU/mL Final  . T3, Free 11/05/2018 3.1  2.3 - 4.2 pg/mL Final  . Free T4 11/05/2018 1.00  0.60 - 1.60 ng/dL Final   Comment: Specimens from patients who are undergoing biotin therapy and /or ingesting biotin supplements may contain high levels of biotin.  The higher biotin concentration in these specimens interferes with this Free T4 assay.  Specimens that contain high levels  of biotin may cause false high results for this Free T4 assay.  Please interpret results in light of the total clinical presentation of the patient.    . Thyroglobulin Ab 11/05/2018 <1  < or = 1 IU/mL Final  . Thyroperoxidase Ab SerPl-aCnc 11/05/2018 1  <9 IU/mL Final   Thyroid antibody levels are not elevated.  TFTs normal.  She is currently taking the equivalent of 75 mcg levothyroxine daily.  I will advise her to start 45 mg NP thyroid daily.  I will have her back for labs in 1.5 months.  Philemon Kingdom, MD PhD [facility] Endocrinology

## 2018-11-08 LAB — THYROID PEROXIDASE ANTIBODY: Thyroperoxidase Ab SerPl-aCnc: 1 IU/mL (ref <9)

## 2018-11-08 LAB — THYROGLOBULIN ANTIBODY: Thyroglobulin Ab: <1 IU/mL (ref <=1)

## 2018-11-17 ENCOUNTER — Other Ambulatory Visit: Payer: Self-pay | Admitting: Physician Assistant

## 2018-12-01 ENCOUNTER — Other Ambulatory Visit: Payer: Self-pay | Admitting: Internal Medicine

## 2018-12-08 ENCOUNTER — Other Ambulatory Visit: Payer: Self-pay | Admitting: Adult Health

## 2018-12-09 ENCOUNTER — Other Ambulatory Visit: Payer: Self-pay | Admitting: Internal Medicine

## 2018-12-09 DIAGNOSIS — R7303 Prediabetes: Secondary | ICD-10-CM

## 2018-12-23 ENCOUNTER — Other Ambulatory Visit: Payer: Self-pay | Admitting: Physician Assistant

## 2018-12-24 ENCOUNTER — Encounter: Payer: Self-pay | Admitting: Internal Medicine

## 2019-01-17 ENCOUNTER — Other Ambulatory Visit: Payer: Self-pay | Admitting: Physician Assistant

## 2019-01-18 ENCOUNTER — Encounter: Payer: Self-pay | Admitting: Physician Assistant

## 2019-01-27 ENCOUNTER — Telehealth: Payer: Self-pay

## 2019-01-27 ENCOUNTER — Telehealth: Payer: Self-pay | Admitting: Physician Assistant

## 2019-01-27 MED ORDER — TEMAZEPAM 30 MG PO CAPS: ORAL_CAPSULE | ORAL | 2 refills | Status: DC

## 2019-01-27 NOTE — Telephone Encounter (Signed)
-----   Message from Elenor Quinones, [place] sent at 01/27/2019 12:38 PM EDT ----- Regarding: MED REFIL Contact: [phone] Refill on TEMAZEPAM  Patient states that her QTY has changed several times  Should be getting 30 pills? 3 to 6 mths at a time   [facility]

## 2019-01-27 NOTE — Telephone Encounter (Signed)
-----   Message from Vicie Mutters, [place] sent at 01/27/2019  3:34 PM EDT ----- Regarding: RE: MED REFIL Contact: [phone] Controlled substance, sent in 1 month supply with 2 refills.   ----- Message ----- From: Elenor Quinones, [place]: 01/27/2019  12:38 PM EDT To: Vicie Mutters, PA-C Subject: MED REFIL                                      Refill on TEMAZEPAM  Patient states that her QTY has changed several times  Should be getting 30 pills? 3 to 6 mths at a time   [facility]

## 2019-01-27 NOTE — Telephone Encounter (Signed)
Patient has been made aware.  I also called the pharmacy to check and say why the patient was being shorted some of her pills (per PATIENT).  Left voice mail for the pharmacy

## 2019-01-29 ENCOUNTER — Other Ambulatory Visit: Payer: Self-pay | Admitting: Physician Assistant

## 2019-02-14 ENCOUNTER — Other Ambulatory Visit: Payer: Self-pay | Admitting: Internal Medicine

## 2019-02-15 ENCOUNTER — Encounter: Payer: Self-pay | Admitting: Physician Assistant

## 2019-03-02 ENCOUNTER — Other Ambulatory Visit: Payer: Self-pay | Admitting: Internal Medicine

## 2019-03-02 DIAGNOSIS — E119 Type 2 diabetes mellitus without complications: Secondary | ICD-10-CM

## 2019-03-02 MED ORDER — CANAGLIFLOZIN 300 MG PO TABS: ORAL_TABLET | ORAL | 0 refills | Status: DC

## 2019-03-03 ENCOUNTER — Other Ambulatory Visit: Payer: Self-pay | Admitting: Physician Assistant

## 2019-03-03 DIAGNOSIS — E119 Type 2 diabetes mellitus without complications: Secondary | ICD-10-CM

## 2019-03-03 MED ORDER — JARDIANCE 25 MG PO TABS: 25.0000 mg | ORAL_TABLET | Freq: Every day | ORAL | 1 refills | Status: DC

## 2019-03-10 ENCOUNTER — Ambulatory Visit: Payer: 59 | Admitting: Internal Medicine

## 2019-03-17 ENCOUNTER — Other Ambulatory Visit: Payer: Self-pay | Admitting: *Deleted

## 2019-03-17 MED ORDER — LOSARTAN POTASSIUM-HCTZ 100-25 MG PO TABS: ORAL_TABLET | ORAL | 1 refills | Status: DC

## 2019-03-31 ENCOUNTER — Other Ambulatory Visit: Payer: Self-pay | Admitting: Internal Medicine

## 2019-04-01 ENCOUNTER — Telehealth: Payer: Self-pay | Admitting: Physician Assistant

## 2019-04-01 MED ORDER — CANAGLIFLOZIN 300 MG PO TABS: 300.0000 mg | ORAL_TABLET | Freq: Every day | ORAL | 3 refills | Status: DC

## 2019-04-01 NOTE — Telephone Encounter (Signed)
-----   Message from Elenor Quinones, [place] sent at 04/01/2019 11:23 AM EST ----- Regarding: med request Contact: [phone] Needs a Rx for [facility]  She said she can get this one for free.   [facility]: [address]

## 2019-04-20 NOTE — Progress Notes (Signed)
Complete Physical  Assessment and Plan:  Encounter for general adult medical examination with abnormal findings 1 year  Diarrhea, unspecified type Some LLQ pain with palpation, will send in ABX and info for possible diverticulitis Please go to the ER if you have any severe AB pain, unable to hold down food/water, blood in stool or vomit, chest pain, shortness of breath, or any worsening symptoms.  ? If from lack of gallbladder send in [place] for patient to try -     cholestyramine (QUESTRAN) 4 g packet; Take 1 packet by mouth 2 (two) times daily. -     ciprofloxacin (CIPRO) 500 MG tablet; Take 1 tablet (500 mg total) by mouth 2 (two) times daily for 7 days. -     metroNIDAZOLE (FLAGYL) 500 MG tablet; Take 1 tablet (500 mg total) by mouth 3 (three) times daily for 7 days.  Acute left-sided low back pain with left-sided sciatica -     Ambulatory referral to Physical Therapy No red flag symptoms, sent in information and will refer to PT If not better will refer to ortho  Morbid obesity (HCC) -     lisdexamfetamine (VYVANSE) 60 MG capsule; Take 1 capsule (60 mg total) by mouth every morning. Has + binge eating Has tried and failed phentermine, contrave, qysema, belviq, can not afford saxenda, topamax had AE's.  Suggest counseling/noom  Aortic atherosclerosis (HCC) -     EKG 12-Lead Control blood pressure, cholesterol, glucose, increase exercise.   Type 2 diabetes mellitus with hyperlipidemia (HCC) -     Hemoglobin A1c ([facility]) - patient has lost weight, now in normal range, will continue to monitor  Hypothyroidism, unspecified type -     TSH Hypothyroidism-check TSH level, continue medications the same, reminded to take on an empty stomach 30-37mins before food.   Essential hypertension - continue medications, DASH diet, exercise and monitor at home. Call if greater than 130/80.  -     CBC with Diff -     COMPLETE METABOLIC PANEL WITH GFR -     TSH -     Urinalysis, Routine  w reflex microscopic -     Microalbumin / Creatinine Urine Ratio -     EKG 12-Lead  Other migraine without status migrainosus, not intractable Avoid triggers  Mixed hyperlipidemia -     Lipid Profile check lipids decrease fatty foods increase activity.   Medication management -     Magnesium  Fatty liver -     COMPLETE METABOLIC PANEL WITH GFR Check labs, avoid tylenol, alcohol, weight loss advised.   PCOS (polycystic ovarian syndrome) Continue metformin/avoid carbs.  Vitamin D deficiency -     Vitamin D (25 hydroxy)  Endometriosis Monitor  Screening, anemia, deficiency, iron -     Iron,Total/Total Iron Binding Cap  Other orders -     fluconazole (DIFLUCAN) 150 MG tablet; Take 1 tablet (150 mg total) by mouth once for 1 dose.   Discussed med's effects and SE's. Screening labs and tests as requested with regular follow-up as recommended.  HPI  58 y.o. female  presents for a complete physical.  She states since wearing her masks she has had a bump on her upper right lip. Will get worse throughout the day and get better over night.   She went to [facility] and while carrying her bags through the airport and put it in the trunk of her car, she hurt her back. She is has been having left leg pain. Has had pain x  Dec 6th.  Has been taking motrin, BC powder without help.   Her blood pressure has been controlled at home, today their BP is BP: 126/82. She does not workout. She denies chest pain, shortness of breath, dizziness.    BMI is Body mass index is 34.26 kg/m., she is working on diet and exercise. Phentermine did not help unless she took a 1.5 pills, she could not tolerate belviq, she states the qysimia made her sleepy, topiramate she had side effects. She did not do well with contrave. She could not afford saxenda.  Wt Readings from Last 3 Encounters:  04/25/19 175 lb 6.4 oz (79.6 kg)  11/05/18 187 lb (84.8 kg)  08/02/18 182 lb (82.6 kg)   She has chronic constipation,   colonoscopy 2012. States bowels changed after choley. Miralax is nightly and metamucil gave her gas.   She is on cholesterol medication and denies myalgias. Her cholesterol is at goal. The cholesterol last visit was:  Lab Results  Component Value Date   CHOL 160 08/18/2018   HDL 50 08/18/2018   LDLCALC 93 08/18/2018   TRIG 84 08/18/2018   CHOLHDL 3.2 08/18/2018  . She has been working on diet and exercise for DM with hyperlipidemia, started on metformin and invokana and now in normal range, she is no longer on invokana due to insurance. She would like to get on DM medication for weight loss, saxenda would not help. she is on bASA, she is on ACE/ARB and denies foot ulcerations, hyperglycemia, hypoglycemia , increased appetite, nausea, paresthesia of the feet, polydipsia, polyuria, visual disturbances, vomiting and weight loss. Last A1C in the office was:  Lab Results  Component Value Date   HGBA1C 5.6 08/18/2018   Lab Results  Component Value Date   GFRAA 113 08/18/2018   Patient is on Vitamin D supplement.   Lab Results  Component Value Date   VD25OH 111 (H) 04/22/2018     She is on thyroid medication. Her medication was not changed last visit.  She tried armour thyroid but states she was too tired.  Lab Results  Component Value Date   TSH 0.76 11/05/2018  .    Current Medications:   Current Outpatient Medications (Endocrine & Metabolic):  .  estradiol-norethindrone (AMABELZ) 1-0.5 MG tablet, Take 1 tablet Daily .  levothyroxine (SYNTHROID) 100 MCG tablet, Take 1 tablet daily on an empty stomach with only water for 30 minutes & no Antacid meds, Calcium or Magnesium for 4 hours & avoid Biotin .  metFORMIN (GLUCOPHAGE) 1000 MG tablet, TAKE 1 TABLET BY MOUTH  TWICE DAILY FOR DIABETES  Current Outpatient Medications (Cardiovascular):  .  losartan-hydrochlorothiazide (HYZAAR) 100-25 MG tablet, Take 1 tablet Daily for BP .  cholestyramine (QUESTRAN) 4 g packet, Take 1 packet by  mouth 2 (two) times daily.   Current Outpatient Medications (Analgesics):  Marland Kitchen  Aspirin (STANBACK HEADACHE POWDER PO), Take by mouth.   Current Outpatient Medications (Other):  Marland Kitchen  CALCIUM-VITAMIN D PO, Take 1 tablet by mouth daily. Marland Kitchen  GARLIC PO, Take 1 tablet by mouth 2 (two) times daily.   Marland Kitchen  LYSINE PO, Take 1 tablet by mouth at bedtime.   .  Multiple Vitamins-Minerals (MULTIVITAMIN WITH MINERALS) tablet, Take 1 tablet by mouth daily.   .  pantoprazole (PROTONIX) 40 MG tablet, Take 1 tablet Daily for Indigestion & Reflux .  temazepam (RESTORIL) 30 MG capsule, TAKE 1 CAPSULE BY MOUTH EVERY DAY AT EVERY NIGHT AT BEDTIME AS NEEDED FOR INSOMNIA TRY  NOT TO TAKE MORE THAN 5 PER WEEK .  valACYclovir (VALTREX) 500 MG tablet, Take 1 tablet 2 x /day as needed for Cold Sores / Fever Blisters .  ciprofloxacin (CIPRO) 500 MG tablet, Take 1 tablet (500 mg total) by mouth 2 (two) times daily for 7 days. .  fluconazole (DIFLUCAN) 150 MG tablet, Take 1 tablet (150 mg total) by mouth once for 1 dose. .  lisdexamfetamine (VYVANSE) 60 MG capsule, Take 1 capsule (60 mg total) by mouth every morning. .  metroNIDAZOLE (FLAGYL) 500 MG tablet, Take 1 tablet (500 mg total) by mouth 3 (three) times daily for 7 days.  Health Maintenance:   Immunization History  Administered Date(s) Administered  . Influenza,inj,quad, With Preservative 03/18/2016  . Influenza-Unspecified 02/26/2015  . Tdap 07/08/2011  . Zoster 03/21/2014    Tetanus: 2013 Flu vaccine: 2020 at work Zostavax: 2015- wants new one  Pap: 2017 MGM:04/2018  DEXA: 2013 Colonoscopy: 2012 CT AB 05/2018 US thyroid 03/2018 Last Eye Exam:  Dr. Gershon Crane 2017  Patient Care Team: Unk Pinto, MD as PCP - General (Internal Medicine) Servando Salina, MD as Consulting Physician (Obstetrics and Gynecology)  Medical History:  Past Medical History:  Diagnosis Date  . Arthritis   . Constipation, chronic   . Diverticulitis   . Diverticulosis    . Fibroid uterus   . History of gallstones   . Hyperlipidemia   . Hypertension   . Hypothyroidism   . Migraine   . PCOS (polycystic ovarian syndrome)    takes metformin for this  . Prediabetes   . Thyroid disease   . Type II or unspecified type diabetes mellitus without mention of complication, not stated as uncontrolled    Allergies No Known Allergies  SURGICAL HISTORY She  has a past surgical history that includes Breast reduction surgery (1988); Cholecystectomy (1995); Tonsillectomy and adenoidectomy (1990's); LASIK (Left); Refractive surgery (Left); Breast surgery; Eye surgery; and Reduction mammaplasty. FAMILY HISTORY Her family history includes COPD in her mother; Diabetes in her mother and sister; Diverticulitis in her mother; Heart disease in her mother; Leukemia in her mother; Lung cancer in her mother. SOCIAL HISTORY She  reports that she has never smoked. She has never used smokeless tobacco. She reports current alcohol use. She reports that she does not use drugs.   Review of Systems: Review of Systems  Constitutional: Negative for chills, fever and malaise/fatigue.  HENT: Negative for congestion, ear pain and sore throat.   Eyes: Negative.   Respiratory: Negative for cough, shortness of breath and wheezing.   Cardiovascular: Negative for chest pain, palpitations and leg swelling.  Gastrointestinal: Positive for abdominal pain. Negative for blood in stool, constipation, diarrhea, heartburn, melena, nausea and vomiting.  Genitourinary: Negative.   Musculoskeletal: Positive for back pain.  Skin: Negative.   Neurological: Negative for dizziness, sensory change, loss of consciousness and headaches.  Psychiatric/Behavioral: Negative for depression. The patient is not nervous/anxious and does not have insomnia.     Physical Exam: Estimated body mass index is 34.26 kg/m as calculated from the following:   Height as of this encounter: 5' (1.524 m).   Weight as of this  encounter: 175 lb 6.4 oz (79.6 kg). BP 126/82   Pulse 100   Temp 97.6 F (36.4 C)   Ht 5' (1.524 m)   Wt 175 lb 6.4 oz (79.6 kg)   SpO2 97%   BMI 34.26 kg/m   General Appearance: Well nourished well developed, in no apparent distress.  Eyes: PERRLA,  EOMs, conjunctiva no swelling or erythema ENT/Mouth: Ear canals normal without obstruction, swelling, erythema, or discharge.  TMs normal bilaterally with no erythema, bulging, retraction, or loss of landmark.  Oropharynx moist and clear with no exudate, erythema, or swelling.   Neck: Supple, thyroid nodule left side. No bruits.  No cervical adenopathy Respiratory: Respiratory effort normal, Breath sounds clear A&P without wheeze, rhonchi, rales.   Cardio: RRR without murmurs, rubs or gallops. Brisk peripheral pulses without edema.  Chest: symmetric, with normal excursions Breasts: Deferred to obgyn Abdomen: Soft, obese, + LLQ tenderness, no guarding, rebound, hernias, masses, or organomegaly.  Lymphatics: Non tender without lymphadenopathy.  Musculoskeletal: Full ROM all peripheral extremities,5/5 strength, and normal gait. Patient is able to ambulate well. Gait is  Antalgic. Straight leg raising with dorsiflexion on the left with pain mid back, negative on the right. Sensory exam in the legs are normal. Knee reflexes are normal Ankle reflexes are normal Strength is normal and symmetric in arms and legs. There is not SI tenderness to palpation.  There is paraspinal muscle spasm.  There is not midline tenderness.  ROM of spine with  limited in all spheres due to pain.  Skin: Warm, dry without rashes, lesions, ecchymosis. Neuro: Awake and oriented X 3, Cranial nerves intact, reflexes equal bilaterally. Normal muscle tone, no cerebellar symptoms. Sensation intact.  Psych:  normal affect, Insight and Judgment appropriate.   EKG: WNL  no ST changes.  Over 40 minutes of exam, counseling, chart review and critical decision making was  performed  Vicie Mutters 2:52 PM [facility]

## 2019-04-25 ENCOUNTER — Encounter: Payer: Self-pay | Admitting: Physician Assistant

## 2019-04-25 ENCOUNTER — Other Ambulatory Visit: Payer: Self-pay

## 2019-04-25 ENCOUNTER — Ambulatory Visit (INDEPENDENT_AMBULATORY_CARE_PROVIDER_SITE_OTHER): Payer: 59 | Admitting: Physician Assistant

## 2019-04-25 VITALS — BP 126/82 | HR 100 | Temp 97.6°F | Ht 60.0 in | Wt 175.4 lb

## 2019-04-25 DIAGNOSIS — Z136 Encounter for screening for cardiovascular disorders: Secondary | ICD-10-CM | POA: Diagnosis not present

## 2019-04-25 DIAGNOSIS — Z0001 Encounter for general adult medical examination with abnormal findings: Secondary | ICD-10-CM

## 2019-04-25 DIAGNOSIS — E785 Hyperlipidemia, unspecified: Secondary | ICD-10-CM

## 2019-04-25 DIAGNOSIS — Z130 Encounter for screening for diseases of the blood and blood-forming organs and certain disorders involving the immune mechanism: Secondary | ICD-10-CM

## 2019-04-25 DIAGNOSIS — M5442 Lumbago with sciatica, left side: Secondary | ICD-10-CM

## 2019-04-25 DIAGNOSIS — G43809 Other migraine, not intractable, without status migrainosus: Secondary | ICD-10-CM

## 2019-04-25 DIAGNOSIS — Z0000 Encounter for general adult medical examination without abnormal findings: Secondary | ICD-10-CM

## 2019-04-25 DIAGNOSIS — Z79899 Other long term (current) drug therapy: Secondary | ICD-10-CM

## 2019-04-25 DIAGNOSIS — I700 Atherosclerosis of aorta: Secondary | ICD-10-CM

## 2019-04-25 DIAGNOSIS — E782 Mixed hyperlipidemia: Secondary | ICD-10-CM

## 2019-04-25 DIAGNOSIS — R197 Diarrhea, unspecified: Secondary | ICD-10-CM

## 2019-04-25 DIAGNOSIS — E1169 Type 2 diabetes mellitus with other specified complication: Secondary | ICD-10-CM

## 2019-04-25 DIAGNOSIS — I10 Essential (primary) hypertension: Secondary | ICD-10-CM

## 2019-04-25 DIAGNOSIS — K760 Fatty (change of) liver, not elsewhere classified: Secondary | ICD-10-CM

## 2019-04-25 DIAGNOSIS — E559 Vitamin D deficiency, unspecified: Secondary | ICD-10-CM

## 2019-04-25 DIAGNOSIS — N809 Endometriosis, unspecified: Secondary | ICD-10-CM

## 2019-04-25 DIAGNOSIS — E282 Polycystic ovarian syndrome: Secondary | ICD-10-CM

## 2019-04-25 DIAGNOSIS — E039 Hypothyroidism, unspecified: Secondary | ICD-10-CM

## 2019-04-25 MED ORDER — CHOLESTYRAMINE 4 G PO PACK: 1.0000 | PACK | Freq: Two times a day (BID) | ORAL | 11 refills | Status: DC

## 2019-04-25 MED ORDER — LISDEXAMFETAMINE DIMESYLATE 60 MG PO CAPS: 60.0000 mg | ORAL_CAPSULE | ORAL | 0 refills | Status: DC

## 2019-04-25 MED ORDER — CIPROFLOXACIN HCL 500 MG PO TABS: 500.0000 mg | ORAL_TABLET | Freq: Two times a day (BID) | ORAL | 0 refills | Status: AC

## 2019-04-25 MED ORDER — METRONIDAZOLE 500 MG PO TABS: 500.0000 mg | ORAL_TABLET | Freq: Three times a day (TID) | ORAL | 0 refills | Status: AC

## 2019-04-25 MED ORDER — FLUCONAZOLE 150 MG PO TABS: 150.0000 mg | ORAL_TABLET | Freq: Once | ORAL | 3 refills | Status: AC

## 2019-04-25 NOTE — Patient Instructions (Addendum)
Start on cipro/flaygl for diverticulitis Do liquid diet for a few days with jello/brothes then go to bowel rest Bowel rest means no wheat/fiber, so please eat white stuff like potatoes, soup, crackers until you are feeling better and then slowly advance your diet back to veggies and fiber like wheat.    Diverticulitis Diverticulitis is inflammation or infection of small pouches in your colon that form when you have a condition called diverticulosis. The pouches in your colon are called diverticula. Your colon, or large intestine, is where water is absorbed and stool is formed. Complications of diverticulitis can include:  Bleeding.  Severe infection.  Severe pain.  Perforation of your colon.  Obstruction of your colon.  What are the causes? Diverticulitis is caused by bacteria. Diverticulitis happens when stool becomes trapped in diverticula. This allows bacteria to grow in the diverticula, which can lead to inflammation and infection. What increases the risk? People with diverticulosis are at risk for diverticulitis. Eating a diet that does not include enough fiber from fruits and vegetables may make diverticulitis more likely to develop. What are the signs or symptoms? Symptoms of diverticulitis may include:  Abdominal pain and tenderness. The pain is normally located on the left side of the abdomen, but may occur in other areas.  Fever and chills.  Bloating.  Cramping.  Nausea.  Vomiting.  Constipation.  Diarrhea.  Blood in your stool.  How is this diagnosed? Your health care provider will ask you about your medical history and do a physical exam. You may need to have tests done because many medical conditions can cause the same symptoms as diverticulitis. Tests may include:  Blood tests.  Urine tests.  Imaging tests of the abdomen, including X-rays and CT scans.  When your condition is under control, your health care provider may recommend that you have a  colonoscopy. A colonoscopy can show how severe your diverticula are and whether something else is causing your symptoms. How is this treated? Most cases of diverticulitis are mild and can be treated at home. Treatment may include:  Taking over-the-counter pain medicines.  Following a clear liquid diet.  Taking antibiotic medicines by mouth for 7-10 days.  More severe cases may be treated at a hospital. Treatment may include:  Not eating or drinking.  Taking prescription pain medicine.  Receiving antibiotic medicines through an IV tube.  Receiving fluids and nutrition through an IV tube.  Surgery.  Follow these instructions at home:  Follow your health care provider's instructions carefully.  Follow a full liquid diet or other diet as directed by your health care provider. After your symptoms improve, your health care provider may tell you to change your diet. He or she may recommend you eat a high-fiber diet. Fruits and vegetables are good sources of fiber. Fiber makes it easier to pass stool.  Take fiber supplements or probiotics as directed by your health care provider.  Only take medicines as directed by your health care provider.  Keep all your follow-up appointments. Contact a health care provider if:  Your pain does not improve.  You have a hard time eating food.  Your bowel movements do not return to normal. Get help right away if:  Your pain becomes worse.  Your symptoms do not get better.  Your symptoms suddenly get worse.  You have a fever.  You have repeated vomiting.  You have bloody or black, tarry stools. This information is not intended to replace advice given to you by your health  care provider. Make sure you discuss any questions you have with your health care provider. Document Released: 01/22/2005 Document Revised: 09/20/2015 Document Reviewed: 03/09/2013 Elsevier Interactive Patient Education  2017 [place] [facility]  Try the  exercises and other information in the back care manual and we will send you to physical therapy.   You can take meloxicam once during the day as needed (avoid taking other NSAIDS like Alleve or Ibuprofen while taking this)  Aleve, ibuprofen is an antiinflammatory You can take tylenol (500mg ) or tylenol arthritis (650mg ) with the meloxicam/antiinflammatories. The max you can take of tylenol a day is 3000mg  daily, this is a max of 6 pills a day of the regular tyelnol (500mg ) or a max of 4 a day of the tylenol arthritis (650mg ) as long as no other medications you are taking contain tylenol.   this can cause inflammation in your stomach and can cause ulcers or bleeding, this will look like black tarry stools Make sure you taking it with food Try not to take it daily, take AS needed Can take with pepcid  Go to the ER if you have any new weakness in your legs, have trouble controlling your urine or bowels, or have worsening pain.   If you are not better in 1-3 month we will refer you to ortho   Back pain Rehab Ask your health care provider which exercises are safe for you. Do exercises exactly as told by your health care provider and adjust them as directed. It is normal to feel mild stretching, pulling, tightness, or discomfort as you do these exercises, but you should stop right away if you feel sudden pain or your pain gets worse. Do not begin these exercises until told by your health care provider. Stretching and range of motion exercises These exercises warm up your muscles and joints and improve the movement and flexibility of your hips and your back. These exercises also help to relieve pain, numbness, and tingling. Exercise A: Sciatic nerve glide 1. Sit in a chair with your head facing down toward your chest. Place your hands behind your back. Let your shoulders slump forward. 2. Slowly straighten one of your knees while you tilt your head back as if you are looking toward the ceiling. Only  straighten your leg as far as you can without making your symptoms worse. 3. Hold for __________ seconds. 4. Slowly return to the starting position. 5. Repeat with your other leg. Repeat __________ times. Complete this exercise __________ times a day. Exercise B: Knee to chest with hip adduction and internal rotation  1. Lie on your back on a firm surface with both legs straight. 2. Bend one of your knees and move it up toward your chest until you feel a gentle stretch in your lower back and buttock. Then, move your knee toward the shoulder that is on the opposite side from your leg. ? Hold your leg in this position by holding onto the front of your knee. 3. Hold for __________ seconds. 4. Slowly return to the starting position. 5. Repeat with your other leg. Repeat __________ times. Complete this exercise __________ times a day. Exercise C: Prone extension on elbows  1. Lie on your abdomen on a firm surface. A bed may be too soft for this exercise. 2. Prop yourself up on your elbows. 3. Use your arms to help lift your chest up until you feel a gentle stretch in your abdomen and your lower back. ?  This will place some of your body weight on your elbows. If this is uncomfortable, try stacking pillows under your chest. ? Your hips should stay down, against the surface that you are lying on. Keep your hip and back muscles relaxed. 4. Hold for __________ seconds. 5. Slowly relax your upper body and return to the starting position. Repeat __________ times. Complete this exercise __________ times a day. Strengthening exercises These exercises build strength and endurance in your back. Endurance is the ability to use your muscles for a long time, even after they get tired. Exercise D: Pelvic tilt 1. Lie on your back on a firm surface. Bend your knees and keep your feet flat. 2. Tense your abdominal muscles. Tip your pelvis up toward the ceiling and flatten your lower back into the floor. ? To  help with this exercise, you may place a small towel under your lower back and try to push your back into the towel. 3. Hold for __________ seconds. 4. Let your muscles relax completely before you repeat this exercise. Repeat __________ times. Complete this exercise __________ times a day. Exercise E: Alternating arm and leg raises  1. Get on your hands and knees on a firm surface. If you are on a hard floor, you may want to use padding to cushion your knees, such as an exercise mat. 2. Line up your arms and legs. Your hands should be below your shoulders, and your knees should be below your hips. 3. Lift your left leg behind you. At the same time, raise your right arm and straighten it in front of you. ? Do not lift your leg higher than your hip. ? Do not lift your arm higher than your shoulder. ? Keep your abdominal and back muscles tight. ? Keep your hips facing the ground. ? Do not arch your back. ? Keep your balance carefully, and do not hold your breath. 4. Hold for __________ seconds. 5. Slowly return to the starting position and repeat with your right leg and your left arm. Repeat __________ times. Complete this exercise __________ times a day. Posture and body mechanics  Body mechanics refers to the movements and positions of your body while you do your daily activities. Posture is part of body mechanics. Good posture and healthy body mechanics can help to relieve stress in your body's tissues and joints. Good posture means that your spine is in its natural S-curve position (your spine is neutral), your shoulders are pulled back slightly, and your head is not tipped forward. The following are general guidelines for applying improved posture and body mechanics to your everyday activities. Standing   When standing, keep your spine neutral and your feet about hip-width apart. Keep a slight bend in your knees. Your ears, shoulders, and hips should line up.  When you do a task in which  you stand in one place for a long time, place one foot up on a stable object that is 2-4 inches (5-10 cm) high, such as a footstool. This helps keep your spine neutral. Sitting   When sitting, keep your spine neutral and keep your feet flat on the floor. Use a footrest, if necessary, and keep your thighs parallel to the floor. Avoid rounding your shoulders, and avoid tilting your head forward.  When working at a desk or a computer, keep your desk at a height where your hands are slightly lower than your elbows. Slide your chair under your desk so you are close enough to maintain good  posture.  When working at a computer, place your monitor at a height where you are looking straight ahead and you do not have to tilt your head forward or downward to look at the screen. Resting   When lying down and resting, avoid positions that are most painful for you.  If you have pain with activities such as sitting, bending, stooping, or squatting (flexion-based activities), lie in a position in which your body does not bend very much. For example, avoid curling up on your side with your arms and knees near your chest (fetal position).  If you have pain with activities such as standing for a long time or reaching with your arms (extension-based activities), lie with your spine in a neutral position and bend your knees slightly. Try the following positions: ? Lying on your side with a pillow between your knees. ? Lying on your back with a pillow under your knees. Lifting   When lifting objects, keep your feet at least shoulder-width apart and tighten your abdominal muscles.  Bend your knees and hips and keep your spine neutral. It is important to lift using the strength of your legs, not your back. Do not lock your knees straight out.  Always ask for help to lift heavy or awkward objects. This information is not intended to replace advice given to you by your health care provider. Make sure you discuss  any questions you have with your health care provider. Document Released: 04/14/2005 Document Revised: 12/20/2015 Document Reviewed: 12/29/2014 Elsevier Interactive Patient Education  2018 [place] insurance and pharmacy about shingrix - new vaccine   Can go to [email or web address] for more information  Shingrix Vaccination  Two vaccines are licensed and recommended to prevent shingles in the [place] Zoster vaccine live (ZVL, Zostavax) has been in use since 2006. Recombinant zoster vaccine (RZV, Shingrix), has been in use since 2017 and is recommended by [employer] as the preferred shingles vaccine.  What Everyone Should Know about Shingles Vaccine (Shingrix) One of the Recommended Vaccines by Disease Shingles vaccination is the only way to protect against shingles and postherpetic neuralgia (PHN), the most common complication from shingles. CDC recommends that healthy adults 50 years and older get two doses of the shingles vaccine called Shingrix (recombinant zoster vaccine), separated by 2 to 6 months, to prevent shingles and the complications from the disease. Your doctor or pharmacist can give you Shingrix as a shot in your upper arm. Shingrix provides strong protection against shingles and PHN. Two doses of Shingrix is more than 90% effective at preventing shingles and PHN. Protection stays above 85% for at least the first four years after you get vaccinated. Shingrix is the preferred vaccine, over Zostavax (zoster vaccine live), a shingles vaccine in use since 2006. Zostavax may still be used to prevent shingles in healthy adults 60 years and older. For example, you could use Zostavax if a person is allergic to Shingrix, prefers Zostavax, or requests immediate vaccination and Shingrix is unavailable. Who Should Get Shingrix? Healthy adults 50 years and older should get two doses of Shingrix, separated by 2 to 6 months. You should get  Shingrix even if in the past you . had shingles  . received Zostavax  . are not sure if you had chickenpox There is no maximum age for getting Shingrix. If you had shingles in the past, you can get Shingrix to help prevent future occurrences of the disease. There is no specific length of time that you  need to wait after having shingles before you can receive Shingrix, but generally you should make sure the shingles rash has gone away before getting vaccinated. You can get Shingrix whether or not you remember having had chickenpox in the past. Studies show that more than 99% of Americans 40 years and older have had chickenpox, even if they don't remember having the disease. Chickenpox and shingles are related because they are caused by the same virus (varicella zoster virus). After a person recovers from chickenpox, the virus stays dormant (inactive) in the body. It can reactivate years later and cause shingles. If you had Zostavax in the recent past, you should wait at least eight weeks before getting Shingrix. Talk to your healthcare provider to determine the best time to get Shingrix. Shingrix is available in [facility] and pharmacies. To find doctor's offices or pharmacies near you that offer the vaccine, visit [facility] Vaccine FinderExternal. If you have questions about Shingrix, talk with your healthcare provider. Vaccine for Those 36 Years and Older  Shingrix reduces the risk of shingles and PHN by more than 90% in people 52 and older. CDC recommends the vaccine for healthy adults 30 and older.  Who Should Not Get Shingrix? You should not get Shingrix if you: . have ever had a severe allergic reaction to any component of the vaccine or after a dose of Shingrix  . tested negative for immunity to varicella zoster virus. If you test negative, you should get chickenpox vaccine.  . currently have shingles  . currently are pregnant or breastfeeding. Women who are pregnant or breastfeeding  should wait to get Shingrix.  Marland Kitchen receive specific antiviral drugs (acyclovir, famciclovir, or valacyclovir) 24 hours before vaccination (avoid use of these antiviral drugs for 14 days after vaccination)- zoster vaccine live only If you have a minor acute (starts suddenly) illness, such as a cold, you may get Shingrix. But if you have a moderate or severe acute illness, you should usually wait until you recover before getting the vaccine. This includes anyone with a temperature of 101.50F or higher. The side effects of the Shingrix are temporary, and usually last 2 to 3 days. While you may experience pain for a few days after getting Shingrix, the pain will be less severe than having shingles and the complications from the disease. How Well Does Shingrix Work? Two doses of Shingrix provides strong protection against shingles and postherpetic neuralgia (PHN), the most common complication of shingles. . In adults 56 to 58 years old who got two doses, Shingrix was 97% effective in preventing shingles; among adults 70 years and older, Shingrix was 91% effective.  . In adults 16 to 58 years old who got two doses, Shingrix was 91% effective in preventing PHN; among adults 70 years and older, Shingrix was 89% effective. Shingrix protection remained high (more than 85%) in people 70 years and older throughout the four years following vaccination. Since your risk of shingles and PHN increases as you get older, it is important to have strong protection against shingles in your older years. Top of Page  What Are the Possible Side Effects of Shingrix? Studies show that Shingrix is safe. The vaccine helps your body create a strong defense against shingles. As a result, you are likely to have temporary side effects from getting the shots. The side effects may affect your ability to do normal daily activities for 2 to 3 days. Most people got a sore arm with mild or moderate pain after getting Shingrix,  and some also had  redness and swelling where they got the shot. Some people felt tired, had muscle pain, a headache, shivering, fever, stomach pain, or nausea. About 1 out of 6 people who got Shingrix experienced side effects that prevented them from doing regular activities. Symptoms went away on their own in about 2 to 3 days. Side effects were more common in younger people. You might have a reaction to the first or second dose of Shingrix, or both doses. If you experience side effects, you may choose to take over-the-counter pain medicine such as ibuprofen or acetaminophen. If you experience side effects from Shingrix, you should report them to the Vaccine Adverse Event Reporting System (VAERS). Your doctor might file this report, or you can do it yourself through the VAERS websiteExternal, or by calling [phone]. If you have any questions about side effects from Shingrix, talk with your doctor. The shingles vaccine does not contain thimerosal (a preservative containing mercury). Top of Page  When Should I See a Doctor Because of the Side Effects I Experience From Shingrix? In clinical trials, Shingrix was not associated with serious adverse events. In fact, serious side effects from vaccines are extremely rare. For example, for every 1 million doses of a vaccine given, only one or two people may have a severe allergic reaction. Signs of an allergic reaction happen within minutes or hours after vaccination and include hives, swelling of the face and throat, difficulty breathing, a fast heartbeat, dizziness, or weakness. If you experience these or any other life-threatening symptoms, see a doctor right away. Shingrix causes a strong response in your immune system, so it may produce short-term side effects more intense than you are used to from other vaccines. These side effects can be uncomfortable, but they are expected and usually go away on their own in 2 or 3 days. Top of Page  How Can I Pay For Shingrix? There  are several ways shingles vaccine may be paid for: Medicare . Medicare Part D plans cover the shingles vaccine, but there may be a cost to you depending on your plan. There may be a copay for the vaccine, or you may need to pay in full then get reimbursed for a certain amount.  . Medicare Part B does not cover the shingles vaccine. Medicaid . Medicaid may or may not cover the vaccine. Contact your insurer to find out. Private health insurance . Many private health insurance plans will cover the vaccine, but there may be a cost to you depending on your plan. Contact your insurer to find out. Vaccine assistance programs . Some pharmaceutical companies provide vaccines to eligible adults who cannot afford them. You may want to check with the vaccine manufacturer, [employer], about Shingrix. If you do not currently have health insurance, learn more about affordable health coverage optionsExternal. To find doctor's offices or pharmacies near you that offer the vaccine, visit HealthMap Vaccine FinderExternal.

## 2019-04-26 LAB — COMPLETE METABOLIC PANEL WITH GFR
AG Ratio: 1.6 (calc) (ref 1.0–2.5)
ALT: 13 U/L (ref 6–29)
AST: 12 U/L (ref 10–35)
Albumin: 4 g/dL (ref 3.6–5.1)
Alkaline phosphatase (APISO): 59 U/L (ref 37–153)
BUN: 12 mg/dL (ref 7–25)
CO2: 25 mmol/L (ref 20–32)
Calcium: 9.5 mg/dL (ref 8.6–10.4)
Chloride: 104 mmol/L (ref 98–110)
Creat: 0.72 mg/dL (ref 0.50–1.05)
GFR, Est African American: 107 mL/min/{1.73_m2} (ref >=60)
GFR, Est Non African American: 92 mL/min/{1.73_m2} (ref >=60)
Globulin: 2.5 g/dL (calc) (ref 1.9–3.7)
Glucose, Bld: 78 mg/dL (ref 65–99)
Potassium: 4.2 mmol/L (ref 3.5–5.3)
Sodium: 139 mmol/L (ref 135–146)
Total Bilirubin: 0.3 mg/dL (ref 0.2–1.2)
Total Protein: 6.5 g/dL (ref 6.1–8.1)

## 2019-04-26 LAB — MICROALBUMIN / CREATININE URINE RATIO
Creatinine, Urine: 67 mg/dL (ref 20–275)
Microalb Creat Ratio: 7 mcg/mg creat (ref <30)
Microalb, Ur: 0.5 mg/dL

## 2019-04-26 LAB — CBC WITH DIFFERENTIAL/PLATELET
Absolute Monocytes: 525 cells/uL (ref 200–950)
Basophils Absolute: 49 cells/uL (ref 0–200)
Basophils Relative: 0.6 %
Eosinophils Absolute: 148 cells/uL (ref 15–500)
Eosinophils Relative: 1.8 %
HCT: 42.8 % (ref 35.0–45.0)
Hemoglobin: 14.3 g/dL (ref 11.7–15.5)
Lymphs Abs: 2517 cells/uL (ref 850–3900)
MCH: 29.9 pg (ref 27.0–33.0)
MCHC: 33.4 g/dL (ref 32.0–36.0)
MCV: 89.5 fL (ref 80.0–100.0)
MPV: 9.8 fL (ref 7.5–12.5)
Monocytes Relative: 6.4 %
Neutro Abs: 4961 cells/uL (ref 1500–7800)
Neutrophils Relative %: 60.5 %
Platelets: 351 10*3/uL (ref 140–400)
RBC: 4.78 10*6/uL (ref 3.80–5.10)
RDW: 13.2 % (ref 11.0–15.0)
Total Lymphocyte: 30.7 %
WBC: 8.2 10*3/uL (ref 3.8–10.8)

## 2019-04-26 LAB — IRON, TOTAL/TOTAL IRON BINDING CAP
%SAT: 11 % (calc) — ABNORMAL LOW (ref 16–45)
Iron: 44 ug/dL — ABNORMAL LOW (ref 45–160)
TIBC: 412 mcg/dL (calc) (ref 250–450)

## 2019-04-26 LAB — URINALYSIS, ROUTINE W REFLEX MICROSCOPIC
Bilirubin Urine: NEGATIVE
Hgb urine dipstick: NEGATIVE
Ketones, ur: NEGATIVE
Leukocytes,Ua: NEGATIVE
Nitrite: NEGATIVE
Protein, ur: NEGATIVE
Specific Gravity, Urine: 1.025 (ref 1.001–1.03)
pH: 7.5 (ref 5.0–8.0)

## 2019-04-26 LAB — LIPID PANEL
Cholesterol: 180 mg/dL (ref <200)
HDL: 50 mg/dL (ref >=50)
LDL Cholesterol (Calc): 112 mg/dL (calc) — ABNORMAL HIGH
Non-HDL Cholesterol (Calc): 130 mg/dL (calc) — ABNORMAL HIGH (ref <130)
Total CHOL/HDL Ratio: 3.6 (calc) (ref <5.0)
Triglycerides: 85 mg/dL (ref <150)

## 2019-04-26 LAB — HEMOGLOBIN A1C
Hgb A1c MFr Bld: 5.4 % of total Hgb (ref <5.7)
Mean Plasma Glucose: 108 (calc)
eAG (mmol/L): 6 (calc)

## 2019-04-26 LAB — VITAMIN D 25 HYDROXY (VIT D DEFICIENCY, FRACTURES): Vit D, 25-Hydroxy: 103 ng/mL — ABNORMAL HIGH (ref 30–100)

## 2019-04-26 LAB — TSH: TSH: 0.88 mIU/L (ref 0.40–4.50)

## 2019-04-26 LAB — MAGNESIUM: Magnesium: 2.2 mg/dL (ref 1.5–2.5)

## 2019-05-04 ENCOUNTER — Other Ambulatory Visit: Payer: Self-pay | Admitting: Physician Assistant

## 2019-06-08 ENCOUNTER — Other Ambulatory Visit: Payer: Self-pay

## 2019-06-08 ENCOUNTER — Encounter: Payer: Self-pay | Admitting: Rehabilitative and Restorative Service Providers"

## 2019-06-08 ENCOUNTER — Ambulatory Visit (INDEPENDENT_AMBULATORY_CARE_PROVIDER_SITE_OTHER): Payer: 59 | Admitting: Rehabilitative and Restorative Service Providers"

## 2019-06-08 DIAGNOSIS — R262 Difficulty in walking, not elsewhere classified: Secondary | ICD-10-CM | POA: Diagnosis not present

## 2019-06-08 DIAGNOSIS — M5442 Lumbago with sciatica, left side: Secondary | ICD-10-CM | POA: Diagnosis not present

## 2019-06-08 DIAGNOSIS — M6281 Muscle weakness (generalized): Secondary | ICD-10-CM | POA: Diagnosis not present

## 2019-06-08 DIAGNOSIS — M79605 Pain in left leg: Secondary | ICD-10-CM

## 2019-06-08 NOTE — Therapy (Signed)
[facility] [address] [place], [place], [address] Phone: [phone]   Fax:  [phone]  Physical Therapy Evaluation  Patient Details  Name: Kelly Goodwin MRN: [id number] Date of Birth: 05/24/1960 Referring Provider (PT): Vicie Mutters, [place]   Encounter Date: 06/08/2019  PT End of Session - 06/08/19 1616    Visit Number  1    Number of Visits  12    Date for PT Re-Evaluation  07/20/19    PT Start Time  1525    PT Stop Time  1615    PT Time Calculation (min)  50 min    Activity Tolerance  Other (comment)   Mild improvement in resting symptoms post treatment   Behavior During Therapy  [facility] for tasks assessed/performed       Past Medical History:  Diagnosis Date  . Arthritis   . Constipation, chronic   . Diverticulitis   . Diverticulosis   . Fibroid uterus   . History of gallstones   . Hyperlipidemia   . Hypertension   . Hypothyroidism   . Migraine   . PCOS (polycystic ovarian syndrome)    takes metformin for this  . Prediabetes   . Thyroid disease   . Type II or unspecified type diabetes mellitus without mention of complication, not stated as uncontrolled     Past Surgical History:  Procedure Laterality Date  . BREAST REDUCTION SURGERY  1988  . BREAST SURGERY    . CHOLECYSTECTOMY  1995  . EYE SURGERY    . LASIK Left   . REDUCTION MAMMAPLASTY    . REFRACTIVE SURGERY Left   . TONSILLECTOMY AND ADENOIDECTOMY  1990's    There were no vitals filed for this visit.   Subjective Assessment - 06/08/19 1532    Subjective  Pt. indicated she hurt back while lifting luggage around Thanksgiving of 2020.  Created pain and required rest while in [place].  Pt. stated it took some time but felt better until about a week ago when she tripped over bag.  Pt. stated no fall but back twisted and created pain in back again and into Lt leg as well.    Limitations  Sitting;Walking;Standing    Diagnostic tests  denied by Pt.    Patient  Stated Goals  Reduce pain, be able to sit, return to workouts.    Currently in Pain?  Yes    Pain Score  8     Pain Location  Back    Pain Descriptors / Indicators  Tightness;Throbbing;Aching    Pain Radiating Towards  Lt buttock/lateral hip and into anterior/lateral thigh    Pain Onset  1 to 4 weeks ago    Pain Frequency  Intermittent    Aggravating Factors   prolonged sitting, walking prolonged, increased duration of activity, lying down sometimes    Pain Relieving Factors  Change positions but takes a while > 30 mins.    Effect of Pain on Daily Activities  Limited in work and sitting, limiting in normal exercise.         [facility] PT Assessment - 06/08/19 0001      Assessment   Medical Diagnosis  Acute Lt low back pain, L Sciatica    Referring Provider (PT)  Vicie Mutters, PA-C    Onset Date/Surgical Date  05/30/19    Hand Dominance  Right    Prior Therapy  Not for current condition      Precautions   Precautions  None  Restrictions   Weight Bearing Restrictions  No      Balance Screen   Has the patient fallen in the past 6 months  No    Has the patient had a decrease in activity level because of a fear of falling?   Yes    Is the patient reluctant to leave their home because of a fear of falling?   No   pain related limitation     [place]  Two level      Prior Function   Level of [place] Requirements  Desk sitting required, working from home.     Leisure  [employer], housecare      Cognition   Overall Cognitive Status  Within Functional Limits for tasks assessed      Observation/Other Assessments   Observations  unremarkable      ROM / Strength   AROM / PROM / Strength  AROM;PROM;Strength      AROM   AROM Assessment Site  Lumbar    Lumbar Flexion  movement to mid shin, pain in Lt lumbar, Lt posterior buttock.      Lumbar Extension  50% end range lumbar pain, no LE symptoms.     Lumbar - Right Side Bend  Mid thigh c no complaint    Lumbar - Left Side Bend  mid thigh c Lt sided lumbar concordant pain      Strength   Strength Assessment Site  Hip;Knee;Ankle    Right/Left Hip  Right;Left    Right Hip Flexion  5/5    Left Hip Flexion  4/5   c pain   Right/Left Knee  Left;Right    Right Knee Flexion  5/5    Right Knee Extension  5/5    Left Knee Flexion  5/5    Left Knee Extension  5/5    Right/Left Ankle  Left;Right    Right Ankle Dorsiflexion  5/5    Left Ankle Dorsiflexion  5/5      Palpation   Spinal mobility  Concordant lumbar pain c L3-L5 c muscle guarding.  Mild jt restriction L4, L5      Special Tests    Special Tests  Lumbar    Lumbar Tests  Slump Test;Straight Leg Raise      Slump test   Findings  Positive    Side  Left      Straight Leg Raise   Findings  Positive    Side   Left    Comment  --   Concordant Lt lumbar pain c L and R passive SLR to 75 deg     Ambulation/Gait   Gait Pattern  Within Functional Limits                Objective measurements completed on examination: See above findings.              PT Education - 06/08/19 1616    Education Details  HEP, Assessment results, POc    Person(s) Educated  Patient    Methods  Explanation;Demonstration;Verbal cues;Handout;Tactile cues    Comprehension  Verbalized understanding;Returned demonstration       PT Short Term Goals - 06/08/19 1524      PT SHORT TERM GOAL #1   Title  Patient will demonstrate independent use of home exercise program to maintain progress from in  clinic treatments.    Time  2    Period  Weeks    Status  New    Target Date  06/22/19        PT Long Term Goals - 06/08/19 1617      PT LONG TERM GOAL #1   Title  Patient will demonstrate/report pain at worst less than or equal to 2/10 to facilitate minimal limitation in daily activity secondary to pain symptoms.     Time  6    Period  Weeks    Status  New    Target Date  07/20/19      PT LONG TERM GOAL #2   Title  Patient will demonstrate independent use of home exercise program to facilitate ability to maintain/progress functional gains from skilled physical therapy services.    Time  6    Period  Weeks    Status  New    Target Date  07/20/19      PT LONG TERM GOAL #3   Title  Patient will demonstrate lumbar extension 100 % WFL s symptoms to facilitate upright standing, walking posture at PLOF s limitation.    Time  6    Period  Weeks    Status  New    Target Date  07/20/19      PT LONG TERM GOAL #4   Title  Pt. will demonstrate negative passive SLR and slump testing to facilitate normal mobility for daily and recreational activity.    Time  6    Period  Weeks    Status  New    Target Date  07/20/19      PT LONG TERM GOAL #5   Title  Pt. will demonstrate ability to sit without restriction due to symptoms for work activity.    Time  6    Period  Weeks    Status  New    Target Date  07/20/19      Additional Long Term Goals   Additional Long Term Goals  Yes      PT LONG TERM GOAL #6   Title  Pt. will demonstrate lumbar side bending to knee joint s restriction to facilitate abilty to perform usual recreational workout activity at PLOF.    Time  6    Period  Weeks    Status  New    Target Date  07/20/19             Plan - 06/08/19 1619    Clinical Impression Statement  Patient is a 59 y.o. female who comes to clinic with complaints of low back pain, LLE pain with mobility, strength and movement coordination deficits that impair their ability to perform usual daily and recreational functional activities without increase difficulty/symptoms at this time.  Patient to benefit from skilled PT services to address impairments and limitations to improve to previous level of function without restriction secondary to condition.    Personal Factors and Comorbidities  Comorbidity 2     Comorbidities  DM, HTN    Examination-Activity Limitations  Bend;Transfers;Sit;Stand;Lift;Sleep    Examination-Participation Restrictions  Other   work, hobbies   Stability/Clinical Decision Making  Evolving/Moderate complexity    Clinical Decision Making  Moderate    Rehab Potential  Good    PT Frequency  2x / week    PT Duration  6 weeks    PT Treatment/Interventions  ADLs/Self Care Home Management;Electrical Stimulation;Traction;Moist Heat;Iontophoresis 4mg /ml Dexamethasone;Gait training;Stair training;Functional mobility training;Therapeutic activities;Therapeutic exercise;Balance training;Neuromuscular re-education;Manual  techniques;Patient/family education;Passive range of motion;Dry needling;Joint Manipulations;Spinal Manipulations;Taping    PT Next Visit Plan  Review HEP, reassess symptoms    PT Home Exercise Plan  [id number]    Consulted and Agree with Plan of Care  Patient       Patient will benefit from skilled therapeutic intervention in order to improve the following deficits and impairments:  Decreased range of motion, Increased muscle spasms, Pain, Decreased activity tolerance, Hypomobility, Impaired flexibility, Decreased strength, Decreased mobility  Visit Diagnosis: Acute left-sided low back pain with left-sided sciatica - Plan: PT plan of care cert/re-cert  Pain in left leg - Plan: PT plan of care cert/re-cert  Muscle weakness (generalized) - Plan: PT plan of care cert/re-cert  Difficulty in walking, not elsewhere classified - Plan: PT plan of care cert/re-cert     Problem List Patient Active Problem List   Diagnosis Date Noted  . Type 2 diabetes mellitus with hyperlipidemia ([place]) 03/02/2019  . Aortic atherosclerosis ([place]) 06/11/2018  . Fatty liver 05/28/2018  . Vitamin D deficiency 09/27/2014  . Medication management 09/27/2014  . Morbid obesity ([place]) 09/27/2014  . Endometriosis 09/27/2014  . Essential hypertension 07/05/2014  . Hyperlipidemia   . PCOS  (polycystic ovarian syndrome)   . Migraine   . Arthritis   . Irritable bowel syndrome (IBS) 09/01/2013  . Diverticulosis 06/05/2011  . Hypothyroid 06/05/2011    Scot Jun, PT, DPT, OCS, ATC 06/08/19  4:24 PM    [facility] [address] [place], [place], [address] Phone: [phone]   Fax:  [phone]  Name: CHARON BURGEN MRN: [id number] Date of Birth: 1961-04-24

## 2019-06-08 NOTE — Patient Instructions (Signed)
Access Code: [email or web address] URL: [email or web address] Date: 06/08/2019 Prepared by: Scot Jun  Exercises Supine Lower Trunk Rotation - 5 reps - 1 sets - 15 hold - 2x daily - 7x weekly Supine Piriformis Stretch Pulling Heel to Hip - 5 reps - 15 sets - 30 hold - 2x daily - 7x weekly Standing Lumbar Extension - 10 reps - 2-3 sets - 2x daily - 7x weekly

## 2019-06-09 NOTE — Progress Notes (Signed)
Subjective:    Patient ID: Kelly Goodwin, female    DOB: 14-May-1960, 59 y.o.   MRN: [id number]  HPI 59 y.o. AAF presents s/p fall yesterday AM, mechanical fall down the stairs, hit her left buttocks/hip/left rib pain. [place] to her left.   No LOC, did not hit her head. She was able to stand, no pain in her hip, full ROM. Pain is mainly left flank/posterior ribs worse with deep breathing, any movement.  Has been taking tylenol and ibuprofen 800mg  without help.   There were no vitals taken for this visit.  Vitals taken by nurse but not recorded.   Medications  Current Outpatient Medications (Endocrine & Metabolic):  .  estradiol-norethindrone (AMABELZ) 1-0.5 MG tablet, Take 1 tablet Daily .  levothyroxine (SYNTHROID) 100 MCG tablet, Take 1 tablet daily on an empty stomach with only water for 30 minutes & no Antacid meds, Calcium or Magnesium for 4 hours & avoid Biotin .  metFORMIN (GLUCOPHAGE) 1000 MG tablet, TAKE 1 TABLET BY MOUTH  TWICE DAILY FOR DIABETES  Current Outpatient Medications (Cardiovascular):  .  cholestyramine (QUESTRAN) 4 g packet, Take 1 packet by mouth 2 (two) times daily. (Patient not taking: Reported on 06/08/2019) .  losartan-hydrochlorothiazide (HYZAAR) 100-25 MG tablet, Take 1 tablet Daily for BP   Current Outpatient Medications (Analgesics):  Marland Kitchen  Aspirin (STANBACK HEADACHE POWDER PO), Take by mouth. Marland Kitchen  ibuprofen (ADVIL) 800 MG tablet, Take 1 tablet (800 mg total) by mouth every 8 (eight) hours as needed for moderate pain. Marland Kitchen  oxyCODONE-acetaminophen (PERCOCET) 5-325 MG tablet, Take 1 tablet by mouth every 6 (six) hours as needed for up to 3 days for severe pain (rib fracture).   Current Outpatient Medications (Other):  Marland Kitchen  CALCIUM-VITAMIN D PO, Take 1 tablet by mouth daily. .  cyclobenzaprine (FLEXERIL) 10 MG tablet, Take 1 tablet (10 mg total) by mouth 3 (three) times daily as needed for muscle spasms. Marland Kitchen  GARLIC PO, Take 1 tablet by mouth 2 (two) times daily.   Marland Kitchen   lisdexamfetamine (VYVANSE) 60 MG capsule, Take 1 capsule (60 mg total) by mouth every morning. (Patient not taking: Reported on 06/08/2019) .  LYSINE PO, Take 1 tablet by mouth at bedtime.   .  Multiple Vitamins-Minerals (MULTIVITAMIN WITH MINERALS) tablet, Take 1 tablet by mouth daily.   .  pantoprazole (PROTONIX) 40 MG tablet, Take 1 tablet Daily for Indigestion & Reflux .  temazepam (RESTORIL) 30 MG capsule, TAKE 1 CAPSULE BY MOUTH EVERY NIGHT AT BEDTIME AS NEEDED FOR INSOMNIA. TRY NOT TO TAKE MORE THEN 5 PER WEEK .  valACYclovir (VALTREX) 500 MG tablet, Take 1 tablet 2 x /day as needed for Cold Sores / Fever Blisters  Problem list She has Diverticulosis; Hypothyroid; Irritable bowel syndrome (IBS); Hyperlipidemia; PCOS (polycystic ovarian syndrome); Migraine; Arthritis; Essential hypertension; Vitamin D deficiency; Medication management; Morbid obesity ([place]); Endometriosis; Fatty liver; Aortic atherosclerosis ([place]); and Type 2 diabetes mellitus with hyperlipidemia ([place]) on their problem list.   Review of Systems See HPI    Objective:   Physical Exam Constitutional:      Appearance: She is well-developed.  HENT:     Head: Normocephalic and atraumatic.     Right Ear: External ear normal.     Left Ear: External ear normal.  Eyes:     Conjunctiva/sclera: Conjunctivae normal.     Pupils: Pupils are equal, round, and reactive to light.  Neck:     Thyroid: No thyromegaly.  Cardiovascular:  Rate and Rhythm: Normal rate and regular rhythm.     Heart sounds: Normal heart sounds. No murmur. No friction rub. No gallop.   Pulmonary:     Effort: Pulmonary effort is normal. No respiratory distress.     Breath sounds: Normal breath sounds. No wheezing.  Abdominal:     General: Bowel sounds are normal. There is no distension.     Palpations: Abdomen is soft. There is no mass.     Tenderness: There is no abdominal tenderness. There is no guarding or rebound.  Musculoskeletal:         General: Tenderness (Left posterior flank/ribs) present. No swelling or deformity. Normal range of motion.     Cervical back: Normal range of motion and neck supple.  Lymphadenopathy:     Cervical: No cervical adenopathy.  Skin:    General: Skin is warm and dry.     Findings: Bruising (right buttocks, no hematoma apprecaited. ) present.  Neurological:     Mental Status: She is alert and oriented to person, place, and time.     Cranial Nerves: No cranial nerve deficit.     Coordination: Coordination normal.     Deep Tendon Reflexes: Reflexes normal.       Assessment & Plan:  Kelly Goodwin was seen today for acute visit, back pain, other and rib injury.  Diagnoses and all orders for this visit:  Rib pain due to fall down stairs -   DG Ribs Unilateral Left changed to be with chest; Future -     cyclobenzaprine (FLEXERIL) 10 MG tablet; Take 1 tablet (10 mg total) by mouth 3 (three) times daily as needed for muscle spasms. -     oxyCODONE-acetaminophen (PERCOCET) 5-325 MG tablet; Take 1 tablet by mouth every 6 (six) hours as needed for up to 3 days for severe pain (rib fracture). -     ibuprofen (ADVIL) 800 MG tablet; Take 1 tablet (800 mg total) by mouth every 8 (eight) hours as needed for moderate pain.  Went into great detail and length that new studies show that unintential overdose was most likely to occur with concurrent benzo use, that opioids are better acute and short term pain management and long term recent studies show that patients on long term opioid use have worse outcomes and more complications than other medications. Will do very short term opioid for potential rib fracture, patient knows to not take it with restoril. PMDP was reviewed.

## 2019-06-10 ENCOUNTER — Other Ambulatory Visit: Payer: Self-pay

## 2019-06-10 ENCOUNTER — Ambulatory Visit
Admission: RE | Admit: 2019-06-10 | Discharge: 2019-06-10 | Disposition: A | Discharge status: Home / Self Care | Payer: 59 | Source: Ambulatory Visit | Attending: Physician Assistant | Admitting: Physician Assistant

## 2019-06-10 ENCOUNTER — Other Ambulatory Visit: Payer: Self-pay | Admitting: Physician Assistant

## 2019-06-10 ENCOUNTER — Ambulatory Visit (INDEPENDENT_AMBULATORY_CARE_PROVIDER_SITE_OTHER): Payer: 59 | Admitting: Physician Assistant

## 2019-06-10 DIAGNOSIS — R0781 Pleurodynia: Secondary | ICD-10-CM | POA: Diagnosis not present

## 2019-06-10 DIAGNOSIS — W108XXA Fall (on) (from) other stairs and steps, initial encounter: Secondary | ICD-10-CM

## 2019-06-10 MED ORDER — OXYCODONE-ACETAMINOPHEN 5-325 MG PO TABS: 1.0000 | ORAL_TABLET | Freq: Four times a day (QID) | ORAL | 0 refills | Status: AC | PRN

## 2019-06-10 MED ORDER — CYCLOBENZAPRINE HCL 10 MG PO TABS: 10.0000 mg | ORAL_TABLET | Freq: Three times a day (TID) | ORAL | 1 refills | Status: DC | PRN

## 2019-06-10 MED ORDER — IBUPROFEN 800 MG PO TABS: 800.0000 mg | ORAL_TABLET | Freq: Three times a day (TID) | ORAL | 0 refills | Status: DC | PRN

## 2019-06-10 NOTE — Patient Instructions (Addendum)
INFORMATION ABOUT YOUR XRAY  Can walk into [address] for an Insurance account manager. They will have the order and take you back. You do not any paper work, I should get the result back today or tomorrow. This order is good for a year.  Can call [phone] to schedule an appointment if you wish.   WHAT YOU NEED TO KNOW ABOUT OPIOID PAIN MEDICINES  This is temporary- YOU CAN NOT HAVE A REFILL DO NOT TAKE WITHIN 6 HRS OF YOUR SLEEP MEDICATION-CAN STOP YOUR BREATHING  Opioids are strong prescription medications that are used to manage severe pain.  Opioids are better for acute and short term pain management and long term recent studies show that patients on long term opioid use have worse outcomes and more complications than other medications.   What are the serious risks of using opioids?  Too much opioid medication in your body can cause your breathing to STOP- which could lead to DEATH. This risk is greatest for people on other medications that make you sleepy like xanax, valium Ambien, or people with sleep apnea.   You can get ADDICTED to opioids even though you take them exactly as prescribed, especially if you take the for a long time.   If you take an opioid medication for more than a few days your body becomes physically "dependent." This is normal and it means your body has gotten use to the medicine. You should taper off the opioid slowly to avoid withdrawal symptoms.   Addiction can happen to anyone. It is when you crave the drug because they make you feel good in some way. You keep taking the drug even thought you know it is not a good idea and bad things are happening to you. Addiction is a brain disease and may require ongoing treatment. Please talk with your healthcare provider right away if you think you might be addicted.   What should I avoid taking while I am taking opioids?  These medications listed below when taken with an opioid can increase the risk of you stopping breathing  and the risk of death   Alcohol: do not drink any kind of alcohol while taking opioids.  Benzodiazepines like valium or xanax  ** New study shows that unintential overdose was most likely to occur with concurrent benzo use  Muscle relaxants like Soma or Flexeril  Sleep medicines like Ambien or Lunesta  Other opioid medicines  How can I take opioid pain medicine safely?  Take your opioid medication as prescribed.   Do not cut, break, chew, crush or dissolve your medicine.   When your healthcare provider gives you the prescriptions, ask:  How long should I take it for?  What should I do to taper off the medication?  Call your healthcare provider if your opioid medicine is not controlling your pain. DO NOT increase the dose on your own.   Do not share or give you opioid medicine to anyone else.   Store your medicine in a safe place where it can not be reached by children or stolen by family or visitors to your home.   Keep track of the amount of medicine you have.   Do NOT operate heavy machinery or drive while taking opioid medications. They can make you sleepy, dizzy, or lightheaded.       Rib Contusion A rib contusion is a deep bruise on your rib area. Contusions are the result of a blunt trauma that causes bleeding and injury to  the tissues under the skin. A rib contusion may involve bruising of the ribs and of the skin and muscles in the area. The skin over the contusion may turn blue, purple, or yellow. Minor injuries will give you a painless contusion. More severe contusions may stay painful and swollen for a few weeks. What are the causes? This condition is usually caused by a blow, trauma, or direct force to an area of the body. This often occurs while playing contact sports. What are the signs or symptoms? Symptoms of this condition include:  Swelling and redness of the injured area.  Discoloration of the injured area.  Tenderness and soreness of the injured  area.  Pain with or without movement. How is this diagnosed? This condition may be diagnosed based on:  Your symptoms and medical history.  A physical exam.  Imaging tests--such as an X-ray, CT scan, or MRI--to determine if there were internal injuries or broken bones (fractures). How is this treated? This condition may be treated with:  Rest. This is often the best treatment for a rib contusion.  Icing. This reduces swelling and inflammation.  Deep-breathing exercises. These may be recommended to reduce the risk for lung collapse and pneumonia.  Medicines. Over-the-counter or prescription medicines may be given to control pain.  Injection of a numbing medicine around the nerve near your injury (nerve block). Follow these instructions at home:     Medicines  Take over-the-counter and prescription medicines only as told by your health care provider.  Do not drive or use heavy machinery while taking prescription pain medicine.  If you are taking prescription pain medicine, take actions to prevent or treat constipation. Your health care provider may recommend that you: ? Drink enough fluid to keep your urine pale yellow. ? Eat foods that are high in fiber, such as fresh fruits and vegetables, whole grains, and beans. ? Limit foods that are high in fat and processed sugars, such as fried or sweet foods. ? Take an over-the-counter or prescription medicine for constipation. Managing pain, stiffness, and swelling  If directed, put ice on the injured area: ? Put ice in a plastic bag. ? Place a towel between your skin and the bag. ? Leave the ice on for 20 minutes, 2-3 times a day.  Rest the injured area. Avoid strenuous activity and any activities or movements that cause pain. Be careful during activities and avoid bumping the injured area.  Do not lift anything that is heavier than 5 lb (2.3 kg), or the limit that you are told, until your health care provider says that it is  safe. General instructions  Do not use any products that contain nicotine or tobacco, such as cigarettes and e-cigarettes. These can delay healing. If you need help quitting, ask your health care provider.  Do deep-breathing exercises as told by your health care provider.  If you were given an incentive spirometer, use it every 1-2 hours while you are awake, or as recommended by your health care provider. This device measures how well you are filling your lungs with each breath.  Keep all follow-up visits as told by your health care provider. This is important. Contact a health care provider if you have:  Increased bruising or swelling.  Pain that is not controlled with treatment.  A fever. Get help right away if you:  Have difficulty breathing or shortness of breath.  Develop a continual cough or you cough up thick or bloody sputum.  Feel nauseous or  you vomit.  Have pain in your abdomen. Summary  A rib contusion is a deep bruise on your rib area. Contusions are the result of a blunt trauma that causes bleeding and injury to the tissues under the skin.  The skin overlying the contusion may turn blue, purple, or yellow. Minor injuries may give you a painless contusion. More severe contusions may stay painful and swollen for a few weeks.  Rest the injured area. Avoid strenuous activity and any activities or movements that cause pain. This information is not intended to replace advice given to you by your health care provider. Make sure you discuss any questions you have with your health care provider. Document Revised: 05/13/2017 Document Reviewed: 05/13/2017 Elsevier Patient Education  [place].   Rib Fracture  A rib fracture is a break or crack in one of the bones of the ribs. The ribs are like a cage that goes around your upper chest. A broken or cracked rib is often painful, but most do not cause other problems. Most rib fractures usually heal on their own in 1-3  months. Follow these instructions at home: Managing pain, stiffness, and swelling  If directed, apply ice to the injured area. ? Put ice in a plastic bag. ? Place a towel between your skin and the bag. ? Leave the ice on for 20 minutes, 2-3 times a day.  Take over-the-counter and prescription medicines only as told by your doctor. Activity  Avoid activities that cause pain to the injured area. Protect your injured area.  Slowly increase activity as told by your doctor. General instructions  Do deep breathing as told by your doctor. You may be told to: ? Take deep breaths many times a day. ? Cough many times a day while hugging a pillow. ? Use a device (incentive spirometer) to do deep breathing many times a day.  Drink enough fluid to keep your pee (urine) clear or pale yellow.  Do not wear a rib belt or binder. These do not allow you to breathe deeply.  Keep all follow-up visits as told by your doctor. This is important. Contact a doctor if:  You have a fever. Get help right away if:  You have trouble breathing.  You are short of breath.  You cannot stop coughing.  You cough up thick or bloody spit (sputum).  You feel sick to your stomach (nauseous), throw up (vomit), or have belly (abdominal) pain.  Your pain gets worse and medicine does not help. Summary  A rib fracture is a break or crack in one of the bones of the ribs.  Apply ice to the injured area and take medicines for pain as told by your doctor.  Take deep breaths and cough many times a day. Hug a pillow every time you cough. This information is not intended to replace advice given to you by your health care provider. Make sure you discuss any questions you have with your health care provider. Document Revised: 03/27/2017 Document Reviewed: 07/15/2016 Elsevier Patient Education  2020 [employer].

## 2019-06-13 ENCOUNTER — Other Ambulatory Visit: Payer: Self-pay | Admitting: Internal Medicine

## 2019-06-19 IMAGING — CT CT ABD-PELV W/ CM
2 of 5 series · 15 of 46 positions shown, 17 images · IV contrast (iopamidol)
Comparison: CT the abdomen and pelvis 01/19/2013.

CLINICAL DATA: 57-year-old female with history of nausea, vomiting
and abdominal discomfort with diarrhea and fatigue since March 2017. Additional history of left lower quadrant abdominal pain.

EXAM:
CT ABDOMEN AND PELVIS WITH CONTRAST
TECHNIQUE: Multidetector CT imaging of the abdomen and pelvis was performed
using the standard protocol following bolus administration of
intravenous contrast.
CONTRAST:  100mL XVPHKT-XNN IOPAMIDOL (XVPHKT-XNN) INJECTION 61%

[Series 2: abd/pel w · axial · 0.70mm/px · z∈[-421,-21]mm · 12 of 90 slices shown, 14 images]
[im 5/90  soft-tissue]
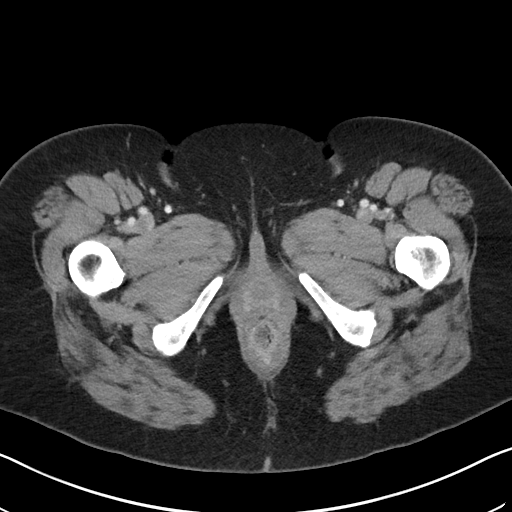
[im 5/90  bone]
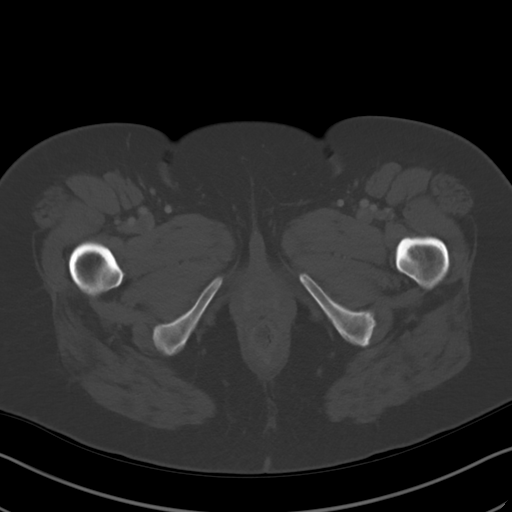
[im 15/90  soft-tissue]
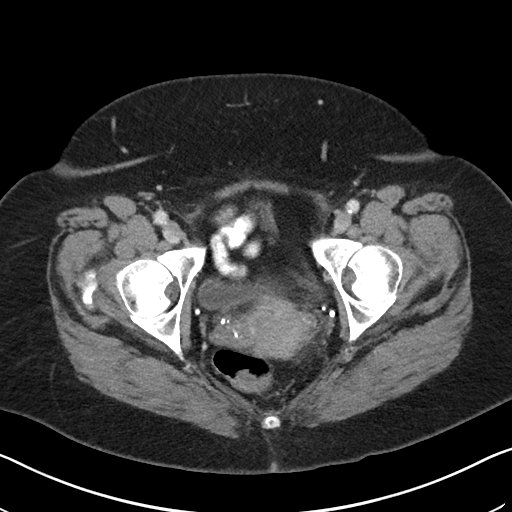
[im 19/90  soft-tissue]
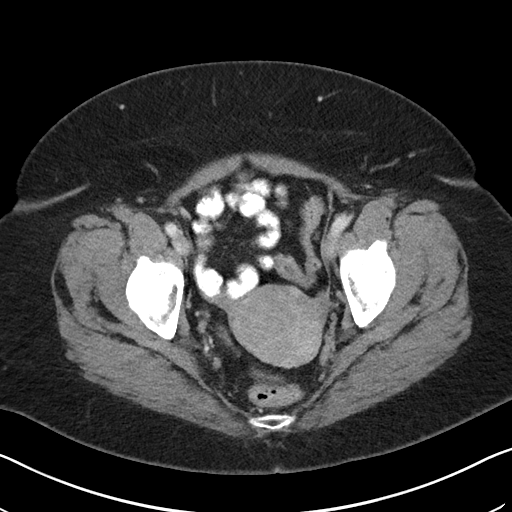
[im 29/90  soft-tissue]
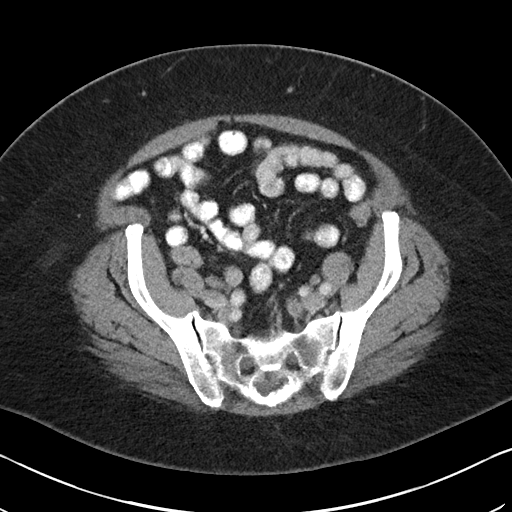
[im 33/90  soft-tissue]
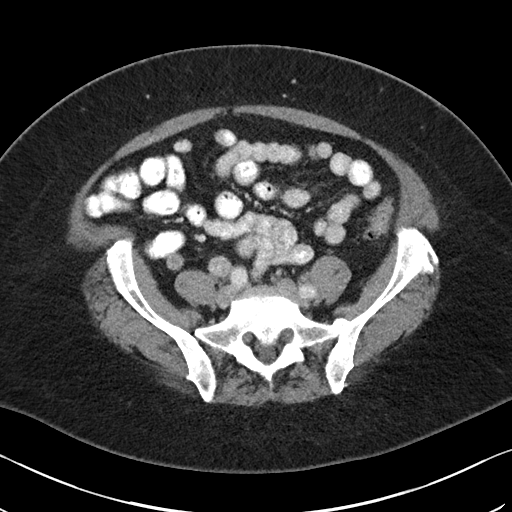
[im 43/90  soft-tissue]
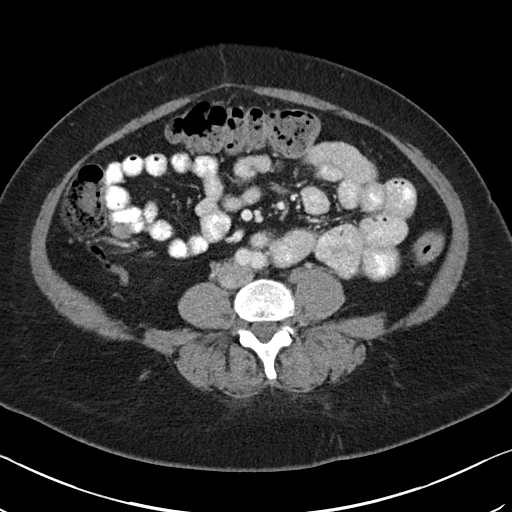
[im 47/90  soft-tissue]
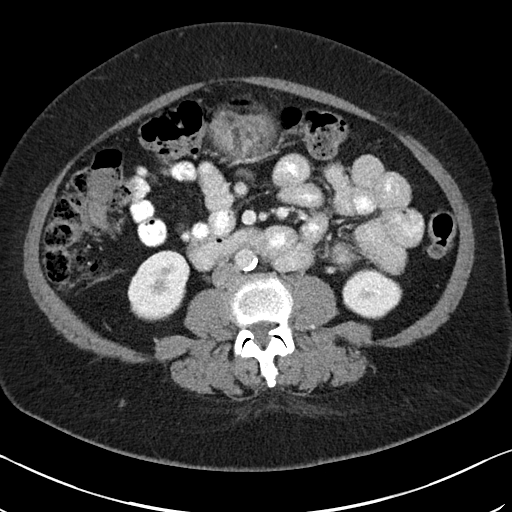
[im 57/90  soft-tissue]
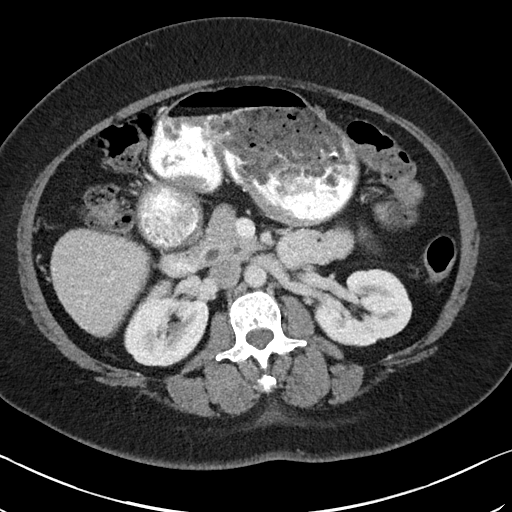
[im 61/90  soft-tissue]
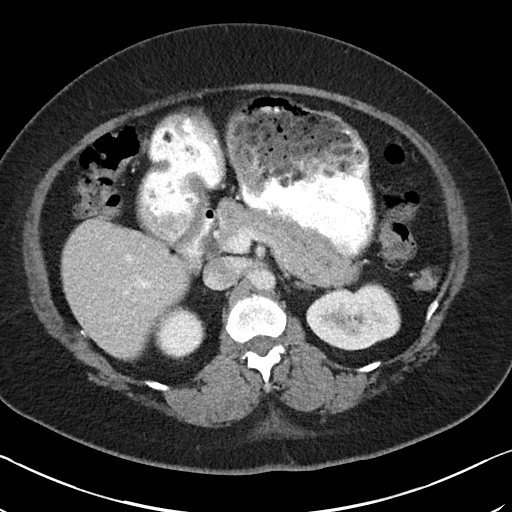
[im 61/90  bone]
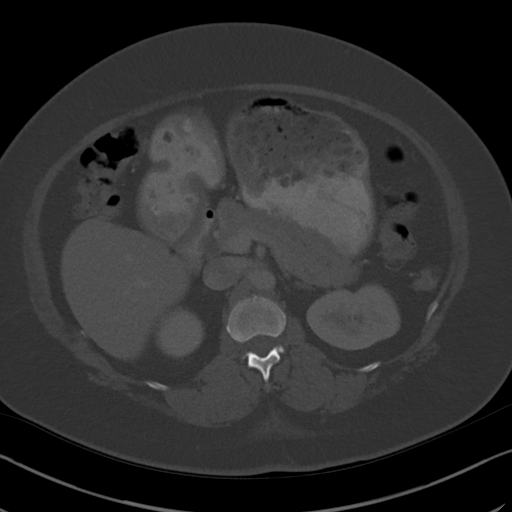
[im 71/90  soft-tissue]
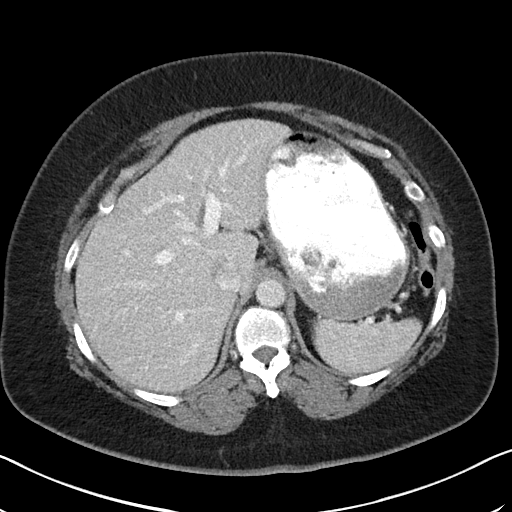
[im 75/90  soft-tissue]
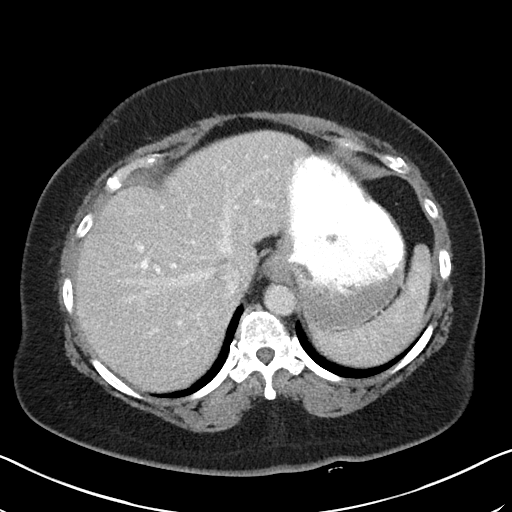
[im 85/90  soft-tissue]
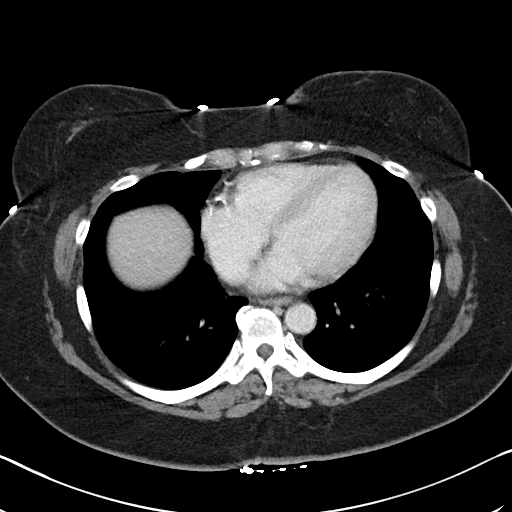

[Series 5: abd/pel w st · coronal · 0.73mm/px · 3 of 101 slices shown]
[im 34/101  soft-tissue]
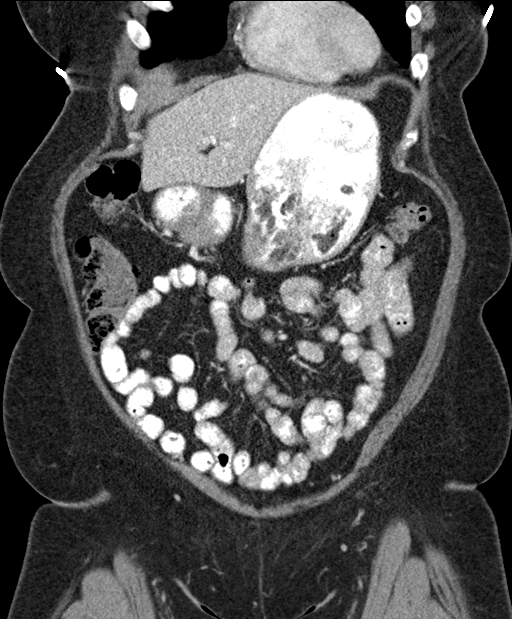
[im 45/101  soft-tissue]
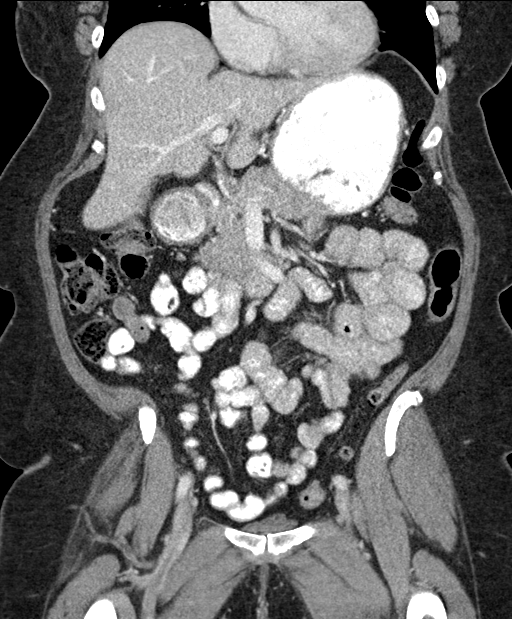
[im 56/101  soft-tissue]
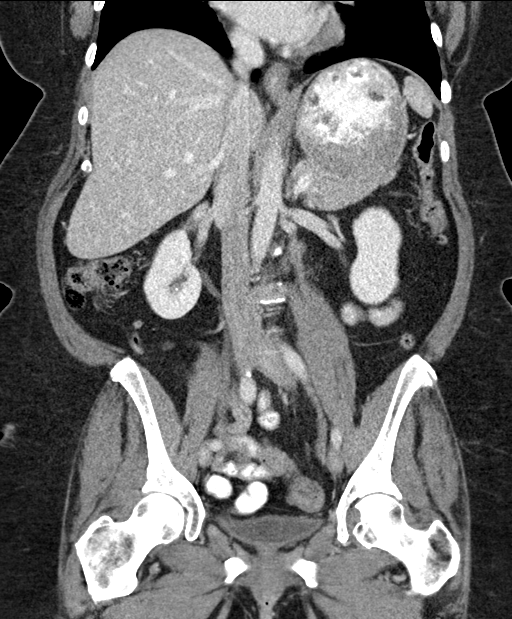

[15 of 46 positions shown; findings below may reference images not displayed]

FINDINGS: Lower chest: Heart size is borderline enlarged. Atherosclerotic
calcifications in the right coronary artery.

Hepatobiliary: No suspicious cystic or solid hepatic lesions. No
intra or extrahepatic biliary ductal dilatation. Status post
cholecystectomy.

Pancreas: No pancreatic mass. No pancreatic ductal dilatation. No
pancreatic or peripancreatic fluid or inflammatory changes.

Spleen: Unremarkable.

Adrenals/Urinary Tract: Bilateral kidneys and bilateral adrenal
glands are normal in appearance. No hydroureteronephrosis. Urinary
bladder is normal in appearance.

Stomach/Bowel: Normal appearance of the stomach. No pathologic
dilatation of small bowel or colon. Numerous colonic diverticulae
are noted, without surrounding inflammatory changes to suggest an
acute diverticulitis at this time. Normal appendix.

Vascular/Lymphatic: Aortic atherosclerosis, without evidence of
aneurysm or dissection in the abdominal or pelvic vasculature. No
lymphadenopathy noted in the abdomen or pelvis.

Reproductive: Uterus is mildly enlarged with a 4.3 cm lesion in the
fundal region of the uterus, likely to represent a fibroid. Ovaries
are unremarkable in appearance.

Other: No significant volume of ascites.  No pneumoperitoneum.

Musculoskeletal: There are no aggressive appearing lytic or blastic
lesions noted in the visualized portions of the skeleton.
IMPRESSION: 1. No acute findings are noted in the abdomen or pelvis to account
for the patient's symptoms.
2. Status post cholecystectomy.
3. Aortic atherosclerosis, in addition to at least right coronary
artery disease. Please note that although the presence of coronary
artery calcium documents the presence of coronary artery disease,
the severity of this disease and any potential stenosis cannot be
assessed on this non-gated CT examination. Assessment for potential
risk factor modification, dietary therapy or pharmacologic therapy
may be warranted, if clinically indicated.
4. Mild colonic diverticulosis without evidence of acute
diverticulitis at this time.
5. Additional incidental findings, as above.

## 2019-06-24 ENCOUNTER — Encounter: Payer: 59 | Admitting: Physical Therapy

## 2019-07-27 ENCOUNTER — Other Ambulatory Visit: Payer: Self-pay | Admitting: Internal Medicine

## 2019-08-01 ENCOUNTER — Ambulatory Visit: Payer: 59 | Admitting: Physician Assistant

## 2019-08-08 ENCOUNTER — Other Ambulatory Visit: Payer: Self-pay | Admitting: Internal Medicine

## 2019-08-09 ENCOUNTER — Other Ambulatory Visit: Payer: Self-pay | Admitting: *Deleted

## 2019-08-09 DIAGNOSIS — R7303 Prediabetes: Secondary | ICD-10-CM

## 2019-08-09 MED ORDER — METFORMIN HCL 1000 MG PO TABS: ORAL_TABLET | ORAL | 3 refills | Status: DC

## 2019-08-12 ENCOUNTER — Other Ambulatory Visit: Payer: Self-pay | Admitting: Physician Assistant

## 2019-08-12 NOTE — Telephone Encounter (Signed)
-----   Message from Elenor Quinones, [place] sent at 08/11/2019  4:33 PM EDT ----- Regarding: office note Contact: [phone] Refill on; [email or web address]    [facility]: [facility]

## 2019-08-22 NOTE — Progress Notes (Signed)
Assessment and Plan:  Aortic atherosclerosis (HCC) Control blood pressure, cholesterol, glucose, increase exercise.   Morbid obesity ([place]) - follow up 3 months for progress monitoring - increase veggies, decrease carbs - long discussion about weight loss, diet, and exercise  Type 2 diabetes mellitus with hyperlipidemia (HCC) -     Lipid panel -     Hemoglobin A1c Discussed general issues about diabetes pathophysiology and management., Educational material distributed., Suggested low cholesterol diet., Encouraged aerobic exercise., Discussed foot care., Reminded to get yearly retinal exam.   Essential hypertension -     CBC with Differential/Platelet -     COMPLETE METABOLIC PANEL WITH GFR -     TSH - continue medications, DASH diet, exercise and monitor at home. Call if greater than 130/80.   Vitamin D deficiency -     VITAMIN D 25 Hydroxy (Vit-D Deficiency, Fractures)  Medication management -     Magnesium   Future Appointments  Date Time Provider [place]  04/24/2020  2:00 PM Vicie Mutters, PA-C [id number] None     HPI 59 y.o.female presents for 3 month follow up.    She has abnormal BM's, last colonoscopy 2012. She will do miralax every night, will have small BM in the morning but won't feel a complete evacuation.   Her blood pressure has been controlled at home, today their BP is BP: 134/72  She does not workout. She denies chest pain, shortness of breath, dizziness. Her BP has been up occ with trying to give blood after her pfizer.  BP Readings from Last 3 Encounters:  08/23/19 134/72  04/25/19 126/82  11/05/18 130/70    BMI is Body mass index is 36.33 kg/m., she is working on diet and exercise. - Has tried and failed phentermine, contrave, qysema, belviq, can not afford saxenda, topamax had AE's. Given vyvanse for binge eating last visit but she did not start them yet.   Suggest counseling/noom Wt Readings from Last 3 Encounters:  08/23/19 186 lb  (84.4 kg)  04/25/19 175 lb 6.4 oz (79.6 kg)  11/05/18 187 lb (84.8 kg)    She is on cholesterol medication and denies myalgias. Her cholesterol is at goal. The cholesterol last visit was:   Lab Results  Component Value Date   CHOL 180 04/25/2019   HDL 50 04/25/2019   LDLCALC 112 (H) 04/25/2019   TRIG 85 04/25/2019   CHOLHDL 3.6 04/25/2019    She has been working on diet and exercise for diabetes but with diet and invokana she is not in the preDM range even, she is on metformin for PCOS, has been out x 2 weeks and denies paresthesia of the feet, polydipsia, polyuria and visual disturbances. Last A1C in the office was:  Lab Results  Component Value Date   HGBA1C 5.4 04/25/2019   Patient is on Vitamin D supplement.   Lab Results  Component Value Date   VD25OH 103 (H) 04/25/2019      Past Medical History:  Diagnosis Date  . Arthritis   . Constipation, chronic   . Diverticulitis   . Diverticulosis   . Fibroid uterus   . History of gallstones   . Hyperlipidemia   . Hypertension   . Hypothyroidism   . Migraine   . PCOS (polycystic ovarian syndrome)    takes metformin for this  . Prediabetes   . Thyroid disease   . Type II or unspecified type diabetes mellitus without mention of complication, not stated as uncontrolled  No Known Allergies     Current Outpatient Medications (Endocrine & Metabolic):  .  estradiol-norethindrone (AMABELZ) 1-0.5 MG tablet, Take 1 tablet Daily .  levothyroxine (SYNTHROID) 100 MCG tablet, Take 1 tablet daily on an empty stomach with only water for 30 minutes & no Antacid meds, Calcium or Magnesium for 4 hours & avoid Biotin .  metFORMIN (GLUCOPHAGE) 1000 MG tablet, TAKE 1 TABLET TWICE DAILY FOR DIABETES  Current Outpatient Medications (Cardiovascular):  .  cholestyramine (QUESTRAN) 4 g packet, Take 1 packet by mouth 2 (two) times daily. Marland Kitchen  losartan-hydrochlorothiazide (HYZAAR) 100-25 MG tablet, Take 1 tablet Daily for BP   Current  Outpatient Medications (Analgesics):  Marland Kitchen  Aspirin (STANBACK HEADACHE POWDER PO), Take by mouth. Marland Kitchen  ibuprofen (ADVIL) 800 MG tablet, Take 1 tablet (800 mg total) by mouth every 8 (eight) hours as needed for moderate pain.   Current Outpatient Medications (Other):  Marland Kitchen  CALCIUM-VITAMIN D PO, Take 1 tablet by mouth daily. .  cyclobenzaprine (FLEXERIL) 10 MG tablet, Take 1 tablet (10 mg total) by mouth 3 (three) times daily as needed for muscle spasms. Marland Kitchen  GARLIC PO, Take 1 tablet by mouth 2 (two) times daily.   Marland Kitchen  LYSINE PO, Take 1 tablet by mouth at bedtime.   .  Multiple Vitamins-Minerals (MULTIVITAMIN WITH MINERALS) tablet, Take 1 tablet by mouth daily.   .  pantoprazole (PROTONIX) 40 MG tablet, Take 1 tablet Daily for Indigestion & Heartburn .  temazepam (RESTORIL) 30 MG capsule, TAKE ONE CAPSULE BY MOUTH EVERY NIGHT AT BEDTIME AS NEED FOR INSOMNIA. TRY NOT TO TAKE MORE THAN 5 CAPSULES PER WEEK .  valACYclovir (VALTREX) 500 MG tablet, Take 1 tablet 2 x /day as needed for Cold Sores / Fever Blisters .  lisdexamfetamine (VYVANSE) 60 MG capsule, Take 1 capsule (60 mg total) by mouth every morning. (Patient not taking: Reported on 06/08/2019)  ROS: all negative except above.   Physical Exam: Filed Weights   08/23/19 1558  Weight: 186 lb (84.4 kg)   BP 134/72   Pulse 100   Temp (!) 97.5 F (36.4 C)   Wt 186 lb (84.4 kg)   SpO2 98%   BMI 36.33 kg/m  General Appearance: Well nourished, in no apparent distress. Eyes: PERRLA, EOMs, conjunctiva no swelling or erythema Sinuses: No Frontal/maxillary tenderness ENT/Mouth: Ext aud canals clear, TMs without erythema, bulging. No erythema, swelling, or exudate on post pharynx.  Tonsils not swollen or erythematous. Hearing normal.  Neck: Supple, thyroid normal.  Respiratory: Respiratory effort normal, BS equal bilaterally without rales, rhonchi, wheezing or stridor.  Cardio: RRR with no MRGs. Brisk peripheral pulses without edema.  Abdomen: Soft, +  BS.  Non tender, no guarding, rebound, hernias, masses. Lymphatics: Non tender without lymphadenopathy.  Musculoskeletal: Full ROM, 5/5 strength, normal gait.  Skin: Warm, dry without rashes, lesions, ecchymosis.  Neuro: Cranial nerves intact. Normal muscle tone, no cerebellar symptoms. Sensation intact.  Psych: Awake and oriented X 3, normal affect, tearful, Insight and Judgment appropriate.     Vicie Mutters, PA-C 4:23 PM [facility]

## 2019-08-23 ENCOUNTER — Ambulatory Visit (INDEPENDENT_AMBULATORY_CARE_PROVIDER_SITE_OTHER): Payer: 59 | Admitting: Physician Assistant

## 2019-08-23 ENCOUNTER — Other Ambulatory Visit: Payer: Self-pay

## 2019-08-23 ENCOUNTER — Encounter: Payer: Self-pay | Admitting: Physician Assistant

## 2019-08-23 VITALS — BP 134/72 | HR 100 | Temp 97.5°F | Wt 186.0 lb

## 2019-08-23 DIAGNOSIS — Z79899 Other long term (current) drug therapy: Secondary | ICD-10-CM

## 2019-08-23 DIAGNOSIS — E785 Hyperlipidemia, unspecified: Secondary | ICD-10-CM

## 2019-08-23 DIAGNOSIS — E1169 Type 2 diabetes mellitus with other specified complication: Secondary | ICD-10-CM | POA: Diagnosis not present

## 2019-08-23 DIAGNOSIS — I10 Essential (primary) hypertension: Secondary | ICD-10-CM

## 2019-08-23 DIAGNOSIS — E559 Vitamin D deficiency, unspecified: Secondary | ICD-10-CM

## 2019-08-23 DIAGNOSIS — I700 Atherosclerosis of aorta: Secondary | ICD-10-CM | POA: Diagnosis not present

## 2019-08-23 MED ORDER — FLUTICASONE PROPIONATE 50 MCG/ACT NA SUSP: 2.0000 | Freq: Every day | NASAL | 1 refills | Status: DC

## 2019-08-23 NOTE — Patient Instructions (Addendum)
Can do a steroid nasal spary 1-2 sparys at night each nostril.  Examples are nasonex, flonase, nasocort- they are over the counter.  Remember to spray each nostril twice towards the outer part of your eye.   Do not sniff but instead pinch your nose and tilt your head back to help the medicine get into your sinuses.   The best time to do this is at bedtime.  Stop if you get blurred vision or nose bleeds.   THIS WILL TAKE 7 DAYS TO WORK AND IS BETTER IF YOU START BEFORE SYMPTOMS SO IF YOU HAVE A SEASON OR TIME OF THE YEAR YOU ALWAYS GET A COLD, START BEFORE THAT!   Try the squatty potty Balloon out your stomach while you are pushing for a BM You are due next year for colonoscopy, can go sooner if you are having issues.   MAKE SURE YOU ARE NOT ON BIOTIN   Please do the following for a bowel purge  Purchase a bottle of Miralax over the counter as well as a box of 5 mg dulcolax tablets. Take 4 dulcolax tablets. Wait 1 hour. You will then drink 6-8 capfuls of Miralax mixed in an adequate amount of water/juice/gatorade (you may choose which of these liquids to drink) over the next 2-3 hours. You should expect results within 1 to 6 hours after completing the bowel purge. Go to the er if you have severe AB pain, can not pass gas or stool in over 12 hours, can not hold down any food.   General eating tips  What to Avoid . Avoid added sugars o Often added sugar can be found in processed foods such as many condiments, dry cereals, cakes, cookies, chips, crisps, crackers, candies, sweetened drinks, etc.  o Read labels and AVOID/DECREASE use of foods with the following in their ingredient list: Sugar, fructose, high fructose corn syrup, sucrose, glucose, maltose, dextrose, molasses, cane sugar, brown sugar, any type of syrup, agave nectar, etc.   . Avoid snacking in between meals- drink water or if you feel you need a snack, pick a high water content snack such as cucumbers, watermelon, or any  veggie.  Marland Kitchen Avoid foods made with flour o If you are going to eat food made with flour, choose those made with whole-grains; and, minimize your consumption as much as is tolerable . Avoid processed foods o These foods are generally stocked in the middle of the grocery store.  o Focus on shopping on the perimeter of the grocery.  What to Include . Vegetables o GREEN LEAFY VEGETABLES: Kale, spinach, mustard greens, collard greens, cabbage, broccoli, etc. o OTHER: Asparagus, cauliflower, eggplant, carrots, peas, Brussel sprouts, tomatoes, bell peppers, zucchini, beets, cucumbers, etc. . Grains, seeds, and legumes o Beans: kidney beans, black eyed peas, garbanzo beans, black beans, pinto beans, etc. o Whole, unrefined grains: brown rice, barley, bulgur, oatmeal, etc. . Healthy fats  o Avoid highly processed fats such as vegetable oil o Examples of healthy fats: avocado, olives, virgin olive oil, dark chocolate (?72% Cocoa), nuts (peanuts, almonds, walnuts, cashews, pecans, etc.) o Please still do small amount of these healthy fats, they are dense in calories.  . Low - Moderate Intake of Animal Sources of Protein o Meat sources: chicken, [place], salmon, tuna. Limit to 4 ounces of meat at one time or the size of your palm. o Consider limiting dairy sources, but when choosing dairy focus on: PLAIN [place] yogurt, cottage cheese, high-protein milk . Fruit o Choose berries

## 2019-08-24 LAB — CBC WITH DIFFERENTIAL/PLATELET
Absolute Monocytes: 721 cells/uL (ref 200–950)
Basophils Absolute: 73 cells/uL (ref 0–200)
Basophils Relative: 0.9 %
Eosinophils Absolute: 211 cells/uL (ref 15–500)
Eosinophils Relative: 2.6 %
HCT: 41.3 % (ref 35.0–45.0)
Hemoglobin: 13.9 g/dL (ref 11.7–15.5)
Lymphs Abs: 2535 cells/uL (ref 850–3900)
MCH: 30.7 pg (ref 27.0–33.0)
MCHC: 33.7 g/dL (ref 32.0–36.0)
MCV: 91.2 fL (ref 80.0–100.0)
MPV: 9.5 fL (ref 7.5–12.5)
Monocytes Relative: 8.9 %
Neutro Abs: 4560 cells/uL (ref 1500–7800)
Neutrophils Relative %: 56.3 %
Platelets: 334 10*3/uL (ref 140–400)
RBC: 4.53 10*6/uL (ref 3.80–5.10)
RDW: 12.8 % (ref 11.0–15.0)
Total Lymphocyte: 31.3 %
WBC: 8.1 10*3/uL (ref 3.8–10.8)

## 2019-08-24 LAB — LIPID PANEL
Cholesterol: 192 mg/dL (ref <200)
HDL: 54 mg/dL (ref >=50)
LDL Cholesterol (Calc): 118 mg/dL (calc) — ABNORMAL HIGH
Non-HDL Cholesterol (Calc): 138 mg/dL (calc) — ABNORMAL HIGH (ref <130)
Total CHOL/HDL Ratio: 3.6 (calc) (ref <5.0)
Triglycerides: 96 mg/dL (ref <150)

## 2019-08-24 LAB — COMPLETE METABOLIC PANEL WITH GFR
AG Ratio: 1.6 (calc) (ref 1.0–2.5)
ALT: 15 U/L (ref 6–29)
AST: 11 U/L (ref 10–35)
Albumin: 4.1 g/dL (ref 3.6–5.1)
Alkaline phosphatase (APISO): 56 U/L (ref 37–153)
BUN: 9 mg/dL (ref 7–25)
CO2: 29 mmol/L (ref 20–32)
Calcium: 9.4 mg/dL (ref 8.6–10.4)
Chloride: 101 mmol/L (ref 98–110)
Creat: 0.63 mg/dL (ref 0.50–1.05)
GFR, Est African American: 114 mL/min/{1.73_m2} (ref >=60)
GFR, Est Non African American: 98 mL/min/{1.73_m2} (ref >=60)
Globulin: 2.6 g/dL (calc) (ref 1.9–3.7)
Glucose, Bld: 78 mg/dL (ref 65–99)
Potassium: 3.9 mmol/L (ref 3.5–5.3)
Sodium: 137 mmol/L (ref 135–146)
Total Bilirubin: 0.3 mg/dL (ref 0.2–1.2)
Total Protein: 6.7 g/dL (ref 6.1–8.1)

## 2019-08-24 LAB — VITAMIN D 25 HYDROXY (VIT D DEFICIENCY, FRACTURES): Vit D, 25-Hydroxy: 83 ng/mL (ref 30–100)

## 2019-08-24 LAB — MAGNESIUM: Magnesium: 2.1 mg/dL (ref 1.5–2.5)

## 2019-08-24 LAB — HEMOGLOBIN A1C
Hgb A1c MFr Bld: 5.7 % of total Hgb — ABNORMAL HIGH (ref <5.7)
Mean Plasma Glucose: 117 (calc)
eAG (mmol/L): 6.5 (calc)

## 2019-08-24 LAB — TSH: TSH: 1.11 mIU/L (ref 0.40–4.50)

## 2019-09-02 ENCOUNTER — Other Ambulatory Visit: Payer: Self-pay | Admitting: Internal Medicine

## 2019-09-26 ENCOUNTER — Other Ambulatory Visit: Payer: Self-pay | Admitting: Physician Assistant

## 2019-10-10 ENCOUNTER — Other Ambulatory Visit: Payer: Self-pay

## 2019-10-10 ENCOUNTER — Ambulatory Visit (INDEPENDENT_AMBULATORY_CARE_PROVIDER_SITE_OTHER): Payer: 59 | Admitting: Adult Health

## 2019-10-10 ENCOUNTER — Encounter: Payer: Self-pay | Admitting: Adult Health

## 2019-10-10 VITALS — BP 150/90 | HR 98 | Temp 96.6°F | Wt 186.0 lb

## 2019-10-10 DIAGNOSIS — M5416 Radiculopathy, lumbar region: Secondary | ICD-10-CM | POA: Diagnosis not present

## 2019-10-10 DIAGNOSIS — R10814 Left lower quadrant abdominal tenderness: Secondary | ICD-10-CM

## 2019-10-10 DIAGNOSIS — K5792 Diverticulitis of intestine, part unspecified, without perforation or abscess without bleeding: Secondary | ICD-10-CM

## 2019-10-10 DIAGNOSIS — R1032 Left lower quadrant pain: Secondary | ICD-10-CM | POA: Diagnosis not present

## 2019-10-10 MED ORDER — CIPROFLOXACIN HCL 500 MG PO TABS: 500.0000 mg | ORAL_TABLET | Freq: Two times a day (BID) | ORAL | 0 refills | Status: DC

## 2019-10-10 MED ORDER — METRONIDAZOLE 500 MG PO TABS: 500.0000 mg | ORAL_TABLET | Freq: Three times a day (TID) | ORAL | 0 refills | Status: DC

## 2019-10-10 MED ORDER — PREDNISONE 20 MG PO TABS: ORAL_TABLET | ORAL | 0 refills | Status: DC

## 2019-10-10 NOTE — Progress Notes (Signed)
Assessment and Plan:  Kelly Goodwin was seen today for sciatica and diverticulitis.  Diagnoses and all orders for this visit:  LLQ abdominal pain Left lower quadrant abdominal tenderness without rebound tenderness Diverticulitis Typical sx of diverticulitis for this patient with hx of similar Non-distended abd with LLQ tenderness without rebound, VS stable  Check labs, start abx for presumptive tx; defer imaging for now but if not sig improved in 24-48 hours will pursue CT abd to r/o abscess or other etiology;  Please go to the ER if you have any severe AB pain, unable to hold down food/water, blood in stool or vomit, chest pain, shortness of breath, or any worsening symptoms.  Discussed she may want to avoid popcorn/whole nuts/seeds in the future - ground nut/seed butters are fine -     CBC with Differential/Platelet -     COMPLETE METABOLIC PANEL WITH GFR -     Urinalysis w microscopic + reflex cultur -     ciprofloxacin (CIPRO) 500 MG tablet; Take 1 tablet (500 mg total) by mouth 2 (two) times daily. -     metroNIDAZOLE (FLAGYL) 500 MG tablet; Take 1 tablet (500 mg total) by mouth 3 (three) times daily.  Lumbar back pain with radiculopathy affecting left lower extremity ROM and straight leg raise limited by abdominal pain Hx of similar of limited episodes, no notable injury Prednisone was prescribed,NSAIDs, RICE, and exercise given If not better follow up in office or will refer to PT/orthopedics. Natural history and expected course discussed. Questions answered. Short (2-4 day) period of relative rest recommended until acute symptoms improve. NSAIDs per medication orders. Persue follow up imaging if persistent/not improving -     predniSONE (DELTASONE) 20 MG tablet; 2 tablets daily for 3 days, 1 tablet daily for 4 days.   Further disposition pending results of labs. Discussed med's effects and SE's.   Over 30 minutes of exam, counseling, chart review, and critical decision making was  performed.   Future Appointments  Date Time Provider [place]  04/24/2020  2:00 PM Vicie Mutters, PA-C [facility] None    ------------------------------------------------------------------------------------------------------------------   HPI BP (!) 150/90   Pulse 98   Temp (!) 96.6 F (35.9 C)   Wt 186 lb (84.4 kg)   SpO2 98%   BMI 36.33 kg/m   59 y.o.female with morbid obesity, PCOS, endometriosis, T2DM, diverticulosis with hx of diverticulitis presents for evaluation due to LLQ abdominal pain "like my usual diverticulitis" and also "sciatica."   She reports gradual onset of LLQ that began 2 days ago, with distension, feeling "swollen" in LLQ, with constipation, denies fever/chills ("just hot flashes"), denies blood in stool, n/v/d. She describes constant pain, worse with movement or attempts at BMs, "becomes stabbing pain." She reports had pop corn the night previous, has been eating more seeds in diet and wonders if this contributed. Denies urinary sx, vaginal discharge or bleeding.   She also reports flare of sciatica; reports L lower back with radicular shooting pain down the back of her leg at rest, also new lateral/anterior "pulling" discomfort began 5 days ago, reports hx of same with self limited episodes in the past that recover with steroid taper. Denies recent falls or injury. She reports 8/10 for back. She has been lying on the couch all weekend, has been taking ibuprofen and BC powders to help pain without improvement thus far. Denies loss of bladder or bowel control, weakness of leg. She was referred to PT for this in Feb 2021, states only  went once, didn't find helpful but did exercises at home for a while without benefit.   S/p cholecystectomy; last abd imaging 06/09/2018  Stomach/Bowel: Normal appearance of the stomach. No pathologic dilatation of small bowel or colon. Numerous colonic diverticulae are noted, without surrounding inflammatory changes to  suggest an acute diverticulitis at this time. Normal appendix.  Vascular/Lymphatic: Aortic atherosclerosis, without evidence of aneurysm or dissection in the abdominal or pelvic vasculature. No lymphadenopathy noted in the abdomen or pelvis.  Reproductive: Uterus is mildly enlarged with a 4.3 cm lesion in the fundal region of the uterus, likely to represent a fibroid. Ovaries are unremarkable in appearance.     Past Medical History:  Diagnosis Date  . Arthritis   . Constipation, chronic   . Diverticulitis   . Diverticulosis   . Fibroid uterus   . History of gallstones   . Hyperlipidemia   . Hypertension   . Hypothyroidism   . Migraine   . PCOS (polycystic ovarian syndrome)    takes metformin for this  . Prediabetes   . Thyroid disease   . Type II or unspecified type diabetes mellitus without mention of complication, not stated as uncontrolled      No Known Allergies  Current Outpatient Medications on File Prior to Visit  Medication Sig  . Aspirin (STANBACK HEADACHE POWDER PO) Take by mouth.  Marland Kitchen CALCIUM-VITAMIN D PO Take 1 tablet by mouth daily.  Marland Kitchen estradiol-norethindrone (AMABELZ) 1-0.5 MG tablet Take 1 tablet Daily  . fluticasone (FLONASE) 50 MCG/ACT nasal spray Place 2 sprays into both nostrils at bedtime.  Marland Kitchen GARLIC PO Take 1 tablet by mouth 2 (two) times daily.    Marland Kitchen ibuprofen (ADVIL) 800 MG tablet TAKE 1 TABLET(800 MG) BY MOUTH EVERY 8 HOURS AS NEEDED FOR MODERATE PAIN  . levothyroxine (SYNTHROID) 100 MCG tablet Take 1 tablet daily on an empty stomach with only water for 30 minutes & no Antacid meds, Calcium or Magnesium for 4 hours & avoid Biotin  . losartan-hydrochlorothiazide (HYZAAR) 100-25 MG tablet Take 1 tablet Daily for BP  . LYSINE PO Take 1 tablet by mouth at bedtime.    . metFORMIN (GLUCOPHAGE) 1000 MG tablet TAKE 1 TABLET TWICE DAILY FOR DIABETES  . Multiple Vitamins-Minerals (MULTIVITAMIN WITH MINERALS) tablet Take 1 tablet by mouth daily.    .  pantoprazole (PROTONIX) 40 MG tablet Take 1 tablet Daily to Prevent  Indigestion & Heartburn  . temazepam (RESTORIL) 30 MG capsule TAKE ONE CAPSULE BY MOUTH EVERY NIGHT AT BEDTIME AS NEED FOR INSOMNIA. TRY NOT TO TAKE MORE THAN 5 CAPSULES PER WEEK  . valACYclovir (VALTREX) 500 MG tablet Take 1 tablet 2 x /day as needed for Cold Sores / Fever Blisters  . cyclobenzaprine (FLEXERIL) 10 MG tablet Take 1 tablet (10 mg total) by mouth 3 (three) times daily as needed for muscle spasms. (Patient not taking: Reported on 10/10/2019)  . lisdexamfetamine (VYVANSE) 60 MG capsule Take 1 capsule (60 mg total) by mouth every morning. (Patient not taking: Reported on 06/08/2019)   No current facility-administered medications on file prior to visit.    ROS: all negative except above.   Physical Exam:  BP (!) 150/90   Pulse 98   Temp (!) 96.6 F (35.9 C)   Wt 186 lb (84.4 kg)   SpO2 98%   BMI 36.33 kg/m   General Appearance: Well nourished, well dressed AA female, appears in pain but no acute distress.  Eyes: PERRLA, conjunctiva no swelling or erythema ENT/Mouth:  Ext aud canals clear, TMs without erythema, bulging. No erythema, swelling, or exudate on post pharynx.  Tonsils not swollen or erythematous. Hearing normal.  Neck: Supple Respiratory: Respiratory effort normal, BS equal bilaterally without rales, rhonchi, wheezing or stridor.  Cardio: RRR with no MRGs. Brisk peripheral pulses without edema.  Abdomen: Soft, + BS.  LLQ pain to light palpation, no guarding, rebound, hernias, palpable masses (deep palpation limited by pain). Lymphatics: Non tender without lymphadenopathy.  Musculoskeletal: Patient is able to ambulate well. Gait is not  Antalgic. Straight leg raising with dorsiflexion (hesitant/unable to complete due to LLQ abd pain). Sensory exam in the legs are normal. Knee reflexes are normal Ankle reflexes are normal Strength is normal and symmetric in arms, unable to fully examine pain in LLE due  to LLQ pain. There is not SI tenderness to palpation.  There is not paraspinal muscle spasm.  There is not midline tenderness.  ROM of spine with  limited in all spheres due to pain.  Skin: Warm, dry without rashes, lesions, ecchymosis.  Neuro:  Normal muscle tone, Sensation intact.  Psych: Awake and oriented X 3, normal affect, Insight and Judgment appropriate.     Izora Ribas, NP 9:00 AM [facility]

## 2019-10-10 NOTE — Patient Instructions (Signed)
Diverticulitis  Diverticulitis is infection or inflammation of small pouches (diverticula) in the colon that form due to a condition called diverticulosis. Diverticula can trap stool (feces) and bacteria, causing infection and inflammation. Diverticulitis may cause severe stomach pain and diarrhea. It may lead to tissue damage in the colon that causes bleeding. The diverticula may also burst (rupture) and cause infected stool to enter other areas of the abdomen. Complications of diverticulitis can include:  Bleeding.  Severe infection.  Severe pain.  Rupture (perforation) of the colon.  Blockage (obstruction) of the colon. What are the causes? This condition is caused by stool becoming trapped in the diverticula, which allows bacteria to grow in the diverticula. This leads to inflammation and infection. What increases the risk? You are more likely to develop this condition if:  You have diverticulosis. The risk for diverticulosis increases if: ? You are overweight or obese. ? You use tobacco products. ? You do not get enough exercise.  You eat a diet that does not include enough fiber. High-fiber foods include fruits, vegetables, beans, nuts, and whole grains. What are the signs or symptoms? Symptoms of this condition may include:  Pain and tenderness in the abdomen. The pain is normally located on the left side of the abdomen, but it may occur in other areas.  Fever and chills.  Bloating.  Cramping.  Nausea.  Vomiting.  Changes in bowel routines.  Blood in your stool. How is this diagnosed? This condition is diagnosed based on:  Your medical history.  A physical exam.  Tests to make sure there is nothing else causing your condition. These tests may include: ? Blood tests. ? Urine tests. ? Imaging tests of the abdomen, including X-rays, ultrasounds, MRIs, or CT scans. How is this treated? Most cases of this condition are mild and can be treated at home.  Treatment may include:  Taking over-the-counter pain medicines.  Following a clear liquid diet.  Taking antibiotic medicines by mouth.  Rest. More severe cases may need to be treated at a hospital. Treatment may include:  Not eating or drinking.  Taking prescription pain medicine.  Receiving antibiotic medicines through an IV tube.  Receiving fluids and nutrition through an IV tube.  Surgery. When your condition is under control, your health care provider may recommend that you have a colonoscopy. This is an exam to look at the entire large intestine. During the exam, a lubricated, bendable tube is inserted into the anus and then passed into the rectum, colon, and other parts of the large intestine. A colonoscopy can show how severe your diverticula are and whether something else may be causing your symptoms. Follow these instructions at home: Medicines  Take over-the-counter and prescription medicines only as told by your health care provider. These include fiber supplements, probiotics, and stool softeners.  If you were prescribed an antibiotic medicine, take it as told by your health care provider. Do not stop taking the antibiotic even if you start to feel better.  Do not drive or use heavy machinery while taking prescription pain medicine. General instructions   Follow a full liquid diet or another diet as directed by your health care provider. After your symptoms improve, your health care provider may tell you to change your diet. He or she may recommend that you eat a diet that contains at least 25 g (25 grams) of fiber daily. Fiber makes it easier to pass stool. Healthy sources of fiber include: ? Berries. One cup contains 4-8 grams of   fiber. ? Beans or lentils. One half cup contains 5-8 grams of fiber. ? Green vegetables. One cup contains 4 grams of fiber.  Exercise for at least 30 minutes, 3 times each week. You should exercise hard enough to raise your heart rate and  break a sweat.  Keep all follow-up visits as told by your health care provider. This is important. You may need a colonoscopy. Contact a health care provider if:  Your pain does not improve.  You have a hard time drinking or eating food.  Your bowel movements do not return to normal. Get help right away if:  Your pain gets worse.  Your symptoms do not get better with treatment.  Your symptoms suddenly get worse.  You have a fever.  You vomit more than one time.  You have stools that are bloody, black, or tarry. Summary  Diverticulitis is infection or inflammation of small pouches (diverticula) in the colon that form due to a condition called diverticulosis. Diverticula can trap stool (feces) and bacteria, causing infection and inflammation.  You are at higher risk for this condition if you have diverticulosis and you eat a diet that does not include enough fiber.  Most cases of this condition are mild and can be treated at home. More severe cases may need to be treated at a hospital.  When your condition is under control, your health care provider may recommend that you have an exam called a colonoscopy. This exam can show how severe your diverticula are and whether something else may be causing your symptoms. This information is not intended to replace advice given to you by your health care provider. Make sure you discuss any questions you have with your health care provider. Document Revised: 03/27/2017 Document Reviewed: 05/17/2016 Elsevier Patient Education  2020 [employer]  

## 2019-10-11 ENCOUNTER — Telehealth: Payer: Self-pay | Admitting: *Deleted

## 2019-10-11 DIAGNOSIS — M25552 Pain in left hip: Secondary | ICD-10-CM

## 2019-10-11 DIAGNOSIS — R10814 Left lower quadrant abdominal tenderness: Secondary | ICD-10-CM

## 2019-10-11 LAB — CBC WITH DIFFERENTIAL/PLATELET
Absolute Monocytes: 551 cells/uL (ref 200–950)
Basophils Absolute: 48 cells/uL (ref 0–200)
Basophils Relative: 0.7 %
Eosinophils Absolute: 150 cells/uL (ref 15–500)
Eosinophils Relative: 2.2 %
HCT: 42.4 % (ref 35.0–45.0)
Hemoglobin: 13.9 g/dL (ref 11.7–15.5)
Lymphs Abs: 1516 cells/uL (ref 850–3900)
MCH: 30.3 pg (ref 27.0–33.0)
MCHC: 32.8 g/dL (ref 32.0–36.0)
MCV: 92.4 fL (ref 80.0–100.0)
MPV: 9.6 fL (ref 7.5–12.5)
Monocytes Relative: 8.1 %
Neutro Abs: 4536 cells/uL (ref 1500–7800)
Neutrophils Relative %: 66.7 %
Platelets: 313 10*3/uL (ref 140–400)
RBC: 4.59 10*6/uL (ref 3.80–5.10)
RDW: 12.3 % (ref 11.0–15.0)
Total Lymphocyte: 22.3 %
WBC: 6.8 10*3/uL (ref 3.8–10.8)

## 2019-10-11 LAB — URINALYSIS W MICROSCOPIC + REFLEX CULTURE
Bacteria, UA: NONE SEEN /HPF
Bilirubin Urine: NEGATIVE
Glucose, UA: NEGATIVE
Hgb urine dipstick: NEGATIVE
Hyaline Cast: NONE SEEN /LPF
Ketones, ur: NEGATIVE
Leukocyte Esterase: NEGATIVE
Nitrites, Initial: NEGATIVE
Protein, ur: NEGATIVE
Specific Gravity, Urine: 1.014 (ref 1.001–1.03)
Squamous Epithelial / HPF: NONE SEEN /HPF (ref <=5)
WBC, UA: NONE SEEN /HPF (ref 0–5)
pH: >=8.5 — AB (ref 5.0–8.0)

## 2019-10-11 LAB — COMPLETE METABOLIC PANEL WITH GFR
AG Ratio: 1.5 (calc) (ref 1.0–2.5)
ALT: 12 U/L (ref 6–29)
AST: 11 U/L (ref 10–35)
Albumin: 3.9 g/dL (ref 3.6–5.1)
Alkaline phosphatase (APISO): 54 U/L (ref 37–153)
BUN: 11 mg/dL (ref 7–25)
CO2: 30 mmol/L (ref 20–32)
Calcium: 9.4 mg/dL (ref 8.6–10.4)
Chloride: 105 mmol/L (ref 98–110)
Creat: 0.62 mg/dL (ref 0.50–1.05)
GFR, Est African American: 114 mL/min/{1.73_m2} (ref >=60)
GFR, Est Non African American: 99 mL/min/{1.73_m2} (ref >=60)
Globulin: 2.6 g/dL (calc) (ref 1.9–3.7)
Glucose, Bld: 89 mg/dL (ref 65–99)
Potassium: 4.3 mmol/L (ref 3.5–5.3)
Sodium: 141 mmol/L (ref 135–146)
Total Bilirubin: 0.3 mg/dL (ref 0.2–1.2)
Total Protein: 6.5 g/dL (ref 6.1–8.1)

## 2019-10-11 LAB — NO CULTURE INDICATED

## 2019-10-11 NOTE — Telephone Encounter (Signed)
Patient called. States she was seen by Liane Comber on 10/10/2019 for LLQ pain, which has improved with medication. She states her left outer thigh pain is much worse today. The thigh is not tender to touch, only to movement. She cannot climb stairs. She has started Prednisone taper and is taking Ibuprofen 800 mg, without relief. Patient is concerned and questioned possible need for MRI, to determine cause.

## 2019-10-11 NOTE — Telephone Encounter (Signed)
Enter in error

## 2019-10-12 ENCOUNTER — Other Ambulatory Visit: Payer: Self-pay

## 2019-10-12 ENCOUNTER — Ambulatory Visit
Admission: RE | Admit: 2019-10-12 | Discharge: 2019-10-12 | Disposition: A | Discharge status: Home / Self Care | Payer: 59 | Source: Ambulatory Visit | Attending: Physician Assistant | Admitting: Physician Assistant

## 2019-10-12 DIAGNOSIS — R10814 Left lower quadrant abdominal tenderness: Secondary | ICD-10-CM

## 2019-10-12 DIAGNOSIS — M25552 Pain in left hip: Secondary | ICD-10-CM

## 2019-10-12 MED ORDER — GABAPENTIN 300 MG PO CAPS: 300.0000 mg | ORAL_CAPSULE | Freq: Three times a day (TID) | ORAL | 2 refills | Status: DC

## 2019-10-12 NOTE — Addendum Note (Signed)
Addended by: Vicie Mutters R on: 10/12/2019 08:47 AM   Modules accepted: Orders

## 2019-10-12 NOTE — Addendum Note (Signed)
Addended by: Vicie Mutters R on: 10/12/2019 03:15 PM   Modules accepted: Orders

## 2019-10-12 NOTE — Telephone Encounter (Signed)
Will get an Xray of left hip and lower back.  Will put in an urgent referral to ortho.  Go to the ER if you have any new weakness in your legs, have trouble controlling your urine or bowels, or have worsening pain.

## 2019-10-13 ENCOUNTER — Other Ambulatory Visit: Payer: Self-pay | Admitting: Physician Assistant

## 2019-10-17 ENCOUNTER — Telehealth: Payer: Self-pay | Admitting: Physician Assistant

## 2019-10-17 NOTE — Telephone Encounter (Signed)
Patient seen by her PCP for this LLQ pain last week. She was treated with Cipro and Flagyl. Got a little better then got worse 3 days ago. She finished the Cipro and Flagyl yesterday. She says very tender LLQ pain. A sensation of fullness and constipation, but not constipated. She is on a low residue diet. "Been through this before."  Afebrile. Please review and advise.

## 2019-10-18 ENCOUNTER — Other Ambulatory Visit: Payer: Self-pay

## 2019-10-18 ENCOUNTER — Telehealth: Payer: Self-pay | Admitting: Physician Assistant

## 2019-10-18 ENCOUNTER — Other Ambulatory Visit (INDEPENDENT_AMBULATORY_CARE_PROVIDER_SITE_OTHER): Payer: 59

## 2019-10-18 DIAGNOSIS — R1030 Lower abdominal pain, unspecified: Secondary | ICD-10-CM | POA: Diagnosis not present

## 2019-10-18 LAB — CBC WITH DIFFERENTIAL/PLATELET
Basophils Absolute: 0.1 10*3/uL (ref 0.0–0.1)
Basophils Relative: 1.2 % (ref 0.0–3.0)
Eosinophils Absolute: 0.3 10*3/uL (ref 0.0–0.7)
Eosinophils Relative: 2.7 % (ref 0.0–5.0)
HCT: 42.4 % (ref 36.0–46.0)
Hemoglobin: 14.2 g/dL (ref 12.0–15.0)
Lymphocytes Relative: 25.5 % (ref 12.0–46.0)
Lymphs Abs: 2.6 10*3/uL (ref 0.7–4.0)
MCHC: 33.5 g/dL (ref 30.0–36.0)
MCV: 91.6 fl (ref 78.0–100.0)
Monocytes Absolute: 1 10*3/uL (ref 0.1–1.0)
Monocytes Relative: 9.7 % (ref 3.0–12.0)
Neutro Abs: 6.1 10*3/uL (ref 1.4–7.7)
Neutrophils Relative %: 60.9 % (ref 43.0–77.0)
Platelets: 333 10*3/uL (ref 150.0–400.0)
RBC: 4.63 Mil/uL (ref 3.87–5.11)
RDW: 13.8 % (ref 11.5–15.5)
WBC: 10.1 10*3/uL (ref 4.0–10.5)

## 2019-10-18 LAB — BASIC METABOLIC PANEL
BUN: 15 mg/dL (ref 6–23)
CO2: 29 mEq/L (ref 19–32)
Calcium: 9.5 mg/dL (ref 8.4–10.5)
Chloride: 103 mEq/L (ref 96–112)
Creatinine, Ser: 0.56 mg/dL (ref 0.40–1.20)
GFR: 133.91 mL/min (ref >60.00)
Glucose, Bld: 90 mg/dL (ref 70–99)
Potassium: 3.7 mEq/L (ref 3.5–5.1)
Sodium: 137 mEq/L (ref 135–145)

## 2019-10-18 MED ORDER — AMOXICILLIN-POT CLAVULANATE 875-125 MG PO TABS
1.0000 | ORAL_TABLET | Freq: Two times a day (BID) | ORAL | 0 refills | Status: DC
Start: 2019-10-18 — End: 2019-12-29

## 2019-10-18 MED ORDER — FLUCONAZOLE 150 MG PO TABS
ORAL_TABLET | ORAL | 0 refills | Status: DC
Start: 2019-10-18 — End: 2019-12-29

## 2019-10-18 NOTE — Telephone Encounter (Signed)
Spoke with the patient. Her symptoms are persisting. She is working from home. She is afebrile.  CT 10/19/19 at 9:00 am. Pick up oral contrast and instructions from here.  She will begin antibiotics today. Labs today.

## 2019-10-18 NOTE — Telephone Encounter (Signed)
Diflucan 150 mg #2/0 refills as per medication sheet. Message left for the patient.

## 2019-10-18 NOTE — Telephone Encounter (Signed)
I think since worse , even with ABX - would do CBC, and BMET, and schedule for CT abd/pelvis STAT so can get today or in am Lets also start Augmentin  875 mg  po BID x 10 days if not PCN allergic Back off on eating for 2-3 days , push fluids , very soft  to full liquid diet

## 2019-10-19 ENCOUNTER — Other Ambulatory Visit: Payer: Self-pay

## 2019-10-19 ENCOUNTER — Ambulatory Visit (INDEPENDENT_AMBULATORY_CARE_PROVIDER_SITE_OTHER)
Admission: RE | Admit: 2019-10-19 | Discharge: 2019-10-19 | Disposition: A | Discharge status: Home / Self Care | Payer: 59 | Source: Ambulatory Visit | Attending: Physician Assistant | Admitting: Physician Assistant

## 2019-10-19 DIAGNOSIS — R1030 Lower abdominal pain, unspecified: Secondary | ICD-10-CM

## 2019-10-19 MED ORDER — IOHEXOL 300 MG/ML  SOLN
100.0000 mL | Freq: Once | INTRAMUSCULAR | Status: AC | PRN
  Administered 2019-10-19: 100 mL via INTRAVENOUS

## 2019-11-01 ENCOUNTER — Other Ambulatory Visit: Payer: Self-pay | Admitting: Internal Medicine

## 2019-11-02 ENCOUNTER — Other Ambulatory Visit: Payer: Self-pay | Admitting: Adult Health

## 2019-12-13 ENCOUNTER — Other Ambulatory Visit: Payer: Self-pay | Admitting: Adult Health

## 2019-12-15 ENCOUNTER — Other Ambulatory Visit: Payer: Self-pay | Admitting: Internal Medicine

## 2019-12-15 ENCOUNTER — Other Ambulatory Visit: Payer: Self-pay | Admitting: Physician Assistant

## 2019-12-21 ENCOUNTER — Ambulatory Visit: Payer: 59 | Admitting: Physician Assistant

## 2019-12-27 NOTE — Progress Notes (Signed)
Assessment and Plan:  Aortic atherosclerosis (HCC) Control blood pressure, cholesterol, glucose, increase exercise.   Morbid obesity ([place]) - follow up 3 months for progress monitoring - continue vyvanse of binge eating- doing very well - increase veggies, decrease carbs - long discussion about weight loss, diet, and exercise  Abnormal glucose -     Lipid panel -     Hemoglobin A1c Discussed general issues about diabetes pathophysiology and management., Educational material distributed., Suggested low cholesterol diet., Encouraged aerobic exercise., Discussed foot care., Reminded to get yearly retinal exam.  Essential hypertension -     CBC with Differential/Platelet -     COMPLETE METABOLIC PANEL WITH GFR -     TSH - continue medications, DASH diet, exercise and monitor at home. Call if greater than 130/80.    Future Appointments  Date Time Provider [place]  04/24/2020  2:00 PM Garnet Sierras, NP [facility] None     HPI 59 y.o.female presents for 3 month follow up.    She had recent diverticulitis flare, failed treatment on 06/14 with cipro/flagyl. Started on augmentin by Edward Jolly, PA on 06/21. She had a CT scan 10/19/2019 that did not show diverticulitis.  last colonoscopy 2012. She will do miralax every night, will have small BM in the morning but won't feel a complete evacuation.   She keeps having lower back pain, saw PT once in Feb at [facility] but did not like it. She was sent to ortho as well but declined. She wants to try to go to chiropractor but if that does not help we will refer to ortho.  Patient denies fever, hematuria, incontinence, numbness, tingling, weakness and saddle anesthesia  Her blood pressure has been controlled at home, today their BP is BP: 122/70  She does not workout. She denies chest pain, shortness of breath, dizziness. Her BP has been up occ with trying to give blood after her pfizer.  BP Readings from Last 3 Encounters:   12/29/19 122/70  10/10/19 (!) 150/90  08/23/19 134/72   BMI is Body mass index is 33.98 kg/m., she is working on diet and exercise. - Has tried and failed phentermine, contrave, qysema, belviq, can not afford saxenda, topamax had AE's. She has been on vyvanse for binge eating for 3 weeks, she states she is doing much better, has lost 12 lbs and feeling much better. No issues with hit.  Wt Readings from Last 3 Encounters:  12/29/19 174 lb (78.9 kg)  10/10/19 186 lb (84.4 kg)  08/23/19 186 lb (84.4 kg)    She is on cholesterol medication and denies myalgias. Her cholesterol is at goal. The cholesterol last visit was:   Lab Results  Component Value Date   CHOL 192 08/23/2019   HDL 54 08/23/2019   LDLCALC 118 (H) 08/23/2019   TRIG 96 08/23/2019   CHOLHDL 3.6 08/23/2019    She has been working on diet and exercise for diabetes but with diet and invokana she is not in the preDM range even, she is on metformin for PCOS, has been out x 2 weeks and denies paresthesia of the feet, polydipsia, polyuria and visual disturbances. Last A1C in the office was:  Lab Results  Component Value Date   HGBA1C 5.7 (H) 08/23/2019   Patient is on Vitamin D supplement.   Lab Results  Component Value Date   VD25OH 83 08/23/2019      Past Medical History:  Diagnosis Date  . Arthritis   . Constipation, chronic   .  Diverticulitis   . Diverticulosis   . Fibroid uterus   . History of gallstones   . Hyperlipidemia   . Hypertension   . Hypothyroidism   . Migraine   . PCOS (polycystic ovarian syndrome)    takes metformin for this  . Prediabetes   . Thyroid disease   . Type II or unspecified type diabetes mellitus without mention of complication, not stated as uncontrolled      No Known Allergies     Current Outpatient Medications (Endocrine & Metabolic):  Marland Kitchen  AMABELZ 1-0.5 MG tablet, TAKE 1 TABLET BY MOUTH  DAILY .  levothyroxine (SYNTHROID) 100 MCG tablet, Take 1 tablet daily on an empty  stomach with only water for 30 minutes & no Antacid meds, Calcium or Magnesium for 4 hours & avoid Biotin .  metFORMIN (GLUCOPHAGE) 1000 MG tablet, TAKE 1 TABLET TWICE DAILY FOR DIABETES  Current Outpatient Medications (Cardiovascular):  .  losartan-hydrochlorothiazide (HYZAAR) 100-25 MG tablet, TAKE 1 TABLET BY MOUTH  DAILY FOR BLOOD PRESSURE  Current Outpatient Medications (Respiratory):  .  fluticasone (FLONASE) 50 MCG/ACT nasal spray, Place 2 sprays into both nostrils at bedtime.  Current Outpatient Medications (Analgesics):  Marland Kitchen  Aspirin (STANBACK HEADACHE POWDER PO), Take by mouth. Marland Kitchen  ibuprofen (ADVIL) 800 MG tablet, Take 1 tablet every 8 hours with Food for Pain & Inflammation   Current Outpatient Medications (Other):  Marland Kitchen  CALCIUM-VITAMIN D PO, Take 1 tablet by mouth daily. .  cyclobenzaprine (FLEXERIL) 10 MG tablet, Take 1 tablet (10 mg total) by mouth 3 (three) times daily as needed for muscle spasms. Marland Kitchen  gabapentin (NEURONTIN) 300 MG capsule, Take 1 capsule (300 mg total) by mouth 3 (three) times daily. Marland Kitchen  GARLIC PO, Take 1 tablet by mouth 2 (two) times daily.   Marland Kitchen  LYSINE PO, Take 1 tablet by mouth at bedtime.   .  Multiple Vitamins-Minerals (MULTIVITAMIN WITH MINERALS) tablet, Take 1 tablet by mouth daily.   .  pantoprazole (PROTONIX) 40 MG tablet, Take 1 tablet Daily to Prevent  Indigestion & Heartburn .  temazepam (RESTORIL) 30 MG capsule, TAKE ONE CAPSULE BY MOUTH EVERY NIGHT AT BEDTIME AS NEEDED FOR INSOMNIA, TRY NOT TO TAKE MORE THAN 5 CAPSULES PER WEEK .  valACYclovir (VALTREX) 500 MG tablet, Take 1 tablet 2 x /day as needed for Cold Sores / Fever Blisters .  lisdexamfetamine (VYVANSE) 60 MG capsule, Take 1 capsule (60 mg total) by mouth every morning. (Patient not taking: Reported on 06/08/2019)  ROS: all negative except above.   Physical Exam: Filed Weights   12/29/19 1525  Weight: 174 lb (78.9 kg)   BP 122/70   Pulse (!) 101   Temp 97.6 F (36.4 C)   Wt 174 lb  (78.9 kg)   SpO2 100%   BMI 33.98 kg/m  General Appearance: Well nourished, in no apparent distress. Eyes: PERRLA, EOMs, conjunctiva no swelling or erythema Sinuses: No Frontal/maxillary tenderness ENT/Mouth: Ext aud canals clear, TMs without erythema, bulging. No erythema, swelling, or exudate on post pharynx.  Tonsils not swollen or erythematous. Hearing normal.  Neck: Supple, thyroid normal.  Respiratory: Respiratory effort normal, BS equal bilaterally without rales, rhonchi, wheezing or stridor.  Cardio: RRR with no MRGs. Brisk peripheral pulses without edema.  Abdomen: Soft, + BS.  Non tender, no guarding, rebound, hernias, masses. Lymphatics: Non tender without lymphadenopathy.  Musculoskeletal: Full ROM, 5/5 strength, normal gait. Negative straight leg raise Skin: Warm, dry without rashes, lesions, ecchymosis.  Neuro: Cranial  nerves intact. Normal muscle tone, no cerebellar symptoms. Sensation intact.  Psych: Awake and oriented X 3, normal affect, tearful, Insight and Judgment appropriate.     Vicie Mutters, PA-C 3:47 PM [facility]

## 2019-12-29 ENCOUNTER — Ambulatory Visit (INDEPENDENT_AMBULATORY_CARE_PROVIDER_SITE_OTHER): Payer: 59 | Admitting: Physician Assistant

## 2019-12-29 ENCOUNTER — Encounter: Payer: Self-pay | Admitting: Physician Assistant

## 2019-12-29 ENCOUNTER — Other Ambulatory Visit: Payer: Self-pay

## 2019-12-29 VITALS — BP 122/70 | HR 101 | Temp 97.6°F | Wt 174.0 lb

## 2019-12-29 DIAGNOSIS — I700 Atherosclerosis of aorta: Secondary | ICD-10-CM

## 2019-12-29 DIAGNOSIS — E559 Vitamin D deficiency, unspecified: Secondary | ICD-10-CM

## 2019-12-29 DIAGNOSIS — E1169 Type 2 diabetes mellitus with other specified complication: Secondary | ICD-10-CM

## 2019-12-29 DIAGNOSIS — I10 Essential (primary) hypertension: Secondary | ICD-10-CM

## 2019-12-29 DIAGNOSIS — E782 Mixed hyperlipidemia: Secondary | ICD-10-CM

## 2019-12-29 DIAGNOSIS — E785 Hyperlipidemia, unspecified: Secondary | ICD-10-CM

## 2019-12-29 DIAGNOSIS — Z79899 Other long term (current) drug therapy: Secondary | ICD-10-CM

## 2019-12-29 MED ORDER — LISDEXAMFETAMINE DIMESYLATE 60 MG PO CAPS: 60.0000 mg | ORAL_CAPSULE | ORAL | 0 refills | Status: DC

## 2019-12-29 NOTE — Patient Instructions (Signed)
If back is not getting better or any progressive symptoms I suggest we get an MRI and refer to ortho  Go to the ER if you have any new weakness in your legs, have trouble controlling your urine or bowels, or have worsening pain.   General eating tips  What to Avoid . Avoid added sugars o Often added sugar can be found in processed foods such as many condiments, dry cereals, cakes, cookies, chips, crisps, crackers, candies, sweetened drinks, etc.  o Read labels and AVOID/DECREASE use of foods with the following in their ingredient list: Sugar, fructose, high fructose corn syrup, sucrose, glucose, maltose, dextrose, molasses, cane sugar, brown sugar, any type of syrup, agave nectar, etc.   . Avoid snacking in between meals- drink water or if you feel you need a snack, pick a high water content snack such as cucumbers, watermelon, or any veggie.  Marland Kitchen Avoid foods made with flour o If you are going to eat food made with flour, choose those made with whole-grains; and, minimize your consumption as much as is tolerable . Avoid processed foods o These foods are generally stocked in the middle of the grocery store.  o Focus on shopping on the perimeter of the grocery.  What to Include . Vegetables o GREEN LEAFY VEGETABLES: Kale, spinach, mustard greens, collard greens, cabbage, broccoli, etc. o OTHER: Asparagus, cauliflower, eggplant, carrots, peas, Brussel sprouts, tomatoes, bell peppers, zucchini, beets, cucumbers, etc. . Grains, seeds, and legumes o Beans: kidney beans, black eyed peas, garbanzo beans, black beans, pinto beans, etc. o Whole, unrefined grains: brown rice, barley, bulgur, oatmeal, etc. . Healthy fats  o Avoid highly processed fats such as vegetable oil o Examples of healthy fats: avocado, olives, virgin olive oil, dark chocolate (?72% Cocoa), nuts (peanuts, almonds, walnuts, cashews, pecans, etc.) o Please still do small amount of these healthy fats, they are dense in calories.   . Low - Moderate Intake of Animal Sources of Protein o Meat sources: chicken, [place], salmon, tuna. Limit to 4 ounces of meat at one time or the size of your palm. o Consider limiting dairy sources, but when choosing dairy focus on: PLAIN [place] yogurt, cottage cheese, high-protein milk . Fruit o Choose berries

## 2019-12-30 LAB — CBC WITH DIFFERENTIAL/PLATELET
Absolute Monocytes: 572 cells/uL (ref 200–950)
Basophils Absolute: 39 cells/uL (ref 0–200)
Basophils Relative: 0.6 %
Eosinophils Absolute: 143 cells/uL (ref 15–500)
Eosinophils Relative: 2.2 %
HCT: 40.2 % (ref 35.0–45.0)
Hemoglobin: 13.4 g/dL (ref 11.7–15.5)
Lymphs Abs: 1833 cells/uL (ref 850–3900)
MCH: 29.7 pg (ref 27.0–33.0)
MCHC: 33.3 g/dL (ref 32.0–36.0)
MCV: 89.1 fL (ref 80.0–100.0)
MPV: 9.7 fL (ref 7.5–12.5)
Monocytes Relative: 8.8 %
Neutro Abs: 3913 cells/uL (ref 1500–7800)
Neutrophils Relative %: 60.2 %
Platelets: 344 10*3/uL (ref 140–400)
RBC: 4.51 10*6/uL (ref 3.80–5.10)
RDW: 13 % (ref 11.0–15.0)
Total Lymphocyte: 28.2 %
WBC: 6.5 10*3/uL (ref 3.8–10.8)

## 2019-12-30 LAB — COMPLETE METABOLIC PANEL WITH GFR
AG Ratio: 1.6 (calc) (ref 1.0–2.5)
ALT: 38 U/L — ABNORMAL HIGH (ref 6–29)
AST: 19 U/L (ref 10–35)
Albumin: 4.1 g/dL (ref 3.6–5.1)
Alkaline phosphatase (APISO): 56 U/L (ref 37–153)
BUN: 9 mg/dL (ref 7–25)
CO2: 25 mmol/L (ref 20–32)
Calcium: 9.4 mg/dL (ref 8.6–10.4)
Chloride: 102 mmol/L (ref 98–110)
Creat: 0.61 mg/dL (ref 0.50–1.05)
GFR, Est African American: 115 mL/min/{1.73_m2} (ref >=60)
GFR, Est Non African American: 99 mL/min/{1.73_m2} (ref >=60)
Globulin: 2.6 g/dL (calc) (ref 1.9–3.7)
Glucose, Bld: 74 mg/dL (ref 65–99)
Potassium: 3.8 mmol/L (ref 3.5–5.3)
Sodium: 137 mmol/L (ref 135–146)
Total Bilirubin: 0.4 mg/dL (ref 0.2–1.2)
Total Protein: 6.7 g/dL (ref 6.1–8.1)

## 2019-12-30 LAB — LIPID PANEL
Cholesterol: 186 mg/dL (ref <200)
HDL: 47 mg/dL — ABNORMAL LOW (ref >=50)
LDL Cholesterol (Calc): 119 mg/dL (calc) — ABNORMAL HIGH
Non-HDL Cholesterol (Calc): 139 mg/dL (calc) — ABNORMAL HIGH (ref <130)
Total CHOL/HDL Ratio: 4 (calc) (ref <5.0)
Triglycerides: 94 mg/dL (ref <150)

## 2019-12-30 LAB — HEMOGLOBIN A1C
Hgb A1c MFr Bld: 5.6 % of total Hgb (ref <5.7)
Mean Plasma Glucose: 114 (calc)
eAG (mmol/L): 6.3 (calc)

## 2019-12-30 LAB — TSH: TSH: 0.49 mIU/L (ref 0.40–4.50)

## 2020-01-04 ENCOUNTER — Other Ambulatory Visit: Payer: Self-pay | Admitting: Internal Medicine

## 2020-01-06 ENCOUNTER — Other Ambulatory Visit: Payer: Self-pay | Admitting: Internal Medicine

## 2020-01-15 ENCOUNTER — Other Ambulatory Visit: Payer: Self-pay | Admitting: Adult Health

## 2020-02-01 MED ORDER — LISDEXAMFETAMINE DIMESYLATE 60 MG PO CAPS: 60.0000 mg | ORAL_CAPSULE | ORAL | 0 refills | Status: DC

## 2020-02-01 NOTE — Telephone Encounter (Signed)
-----   Message from Elenor Quinones, [place] sent at 01/31/2020  4:24 PM EDT ----- Regarding: med refill Refill on VYVANSE.

## 2020-02-14 ENCOUNTER — Other Ambulatory Visit: Payer: Self-pay | Admitting: Adult Health

## 2020-02-22 ENCOUNTER — Other Ambulatory Visit: Payer: Self-pay | Admitting: Obstetrics and Gynecology

## 2020-02-22 DIAGNOSIS — Z0000 Encounter for general adult medical examination without abnormal findings: Secondary | ICD-10-CM

## 2020-02-23 ENCOUNTER — Other Ambulatory Visit: Payer: Self-pay

## 2020-02-23 ENCOUNTER — Ambulatory Visit
Admission: RE | Admit: 2020-02-23 | Discharge: 2020-02-23 | Disposition: A | Discharge status: Home / Self Care | Payer: 59 | Source: Ambulatory Visit | Attending: Obstetrics and Gynecology | Admitting: Obstetrics and Gynecology

## 2020-02-23 DIAGNOSIS — Z0000 Encounter for general adult medical examination without abnormal findings: Secondary | ICD-10-CM

## 2020-03-07 ENCOUNTER — Other Ambulatory Visit: Payer: Self-pay | Admitting: Adult Health Nurse Practitioner

## 2020-03-07 MED ORDER — LISDEXAMFETAMINE DIMESYLATE 60 MG PO CAPS: 60.0000 mg | ORAL_CAPSULE | ORAL | 0 refills | Status: DC

## 2020-03-14 ENCOUNTER — Other Ambulatory Visit: Payer: Self-pay | Admitting: Adult Health

## 2020-03-15 ENCOUNTER — Other Ambulatory Visit: Payer: Self-pay | Admitting: Adult Health Nurse Practitioner

## 2020-04-02 ENCOUNTER — Telehealth: Payer: Self-pay

## 2020-04-02 NOTE — Telephone Encounter (Signed)
[facility] states that Amabelz tab, 1-0.5mg  has been discontinued by the manufacturer and is no longer available.

## 2020-04-04 NOTE — Telephone Encounter (Signed)
Left detailed message on voicemail.  

## 2020-04-05 ENCOUNTER — Other Ambulatory Visit: Payer: Self-pay

## 2020-04-05 MED ORDER — LOSARTAN POTASSIUM-HCTZ 100-25 MG PO TABS: ORAL_TABLET | ORAL | 0 refills | Status: DC

## 2020-04-24 ENCOUNTER — Encounter: Payer: 59 | Admitting: Adult Health Nurse Practitioner

## 2020-05-09 ENCOUNTER — Other Ambulatory Visit: Payer: Self-pay

## 2020-05-09 ENCOUNTER — Encounter: Payer: Self-pay | Admitting: Adult Health Nurse Practitioner

## 2020-05-09 ENCOUNTER — Ambulatory Visit (INDEPENDENT_AMBULATORY_CARE_PROVIDER_SITE_OTHER): Payer: 59 | Admitting: Adult Health Nurse Practitioner

## 2020-05-09 VITALS — BP 140/86 | HR 76 | Temp 97.6°F | Ht 61.0 in | Wt 177.0 lb

## 2020-05-09 DIAGNOSIS — Z130 Encounter for screening for diseases of the blood and blood-forming organs and certain disorders involving the immune mechanism: Secondary | ICD-10-CM

## 2020-05-09 DIAGNOSIS — G43809 Other migraine, not intractable, without status migrainosus: Secondary | ICD-10-CM

## 2020-05-09 DIAGNOSIS — I700 Atherosclerosis of aorta: Secondary | ICD-10-CM

## 2020-05-09 DIAGNOSIS — E039 Hypothyroidism, unspecified: Secondary | ICD-10-CM

## 2020-05-09 DIAGNOSIS — Z1321 Encounter for screening for nutritional disorder: Secondary | ICD-10-CM

## 2020-05-09 DIAGNOSIS — E782 Mixed hyperlipidemia: Secondary | ICD-10-CM

## 2020-05-09 DIAGNOSIS — Z136 Encounter for screening for cardiovascular disorders: Secondary | ICD-10-CM

## 2020-05-09 DIAGNOSIS — E559 Vitamin D deficiency, unspecified: Secondary | ICD-10-CM

## 2020-05-09 DIAGNOSIS — I10 Essential (primary) hypertension: Secondary | ICD-10-CM

## 2020-05-09 DIAGNOSIS — Z1389 Encounter for screening for other disorder: Secondary | ICD-10-CM

## 2020-05-09 DIAGNOSIS — E1169 Type 2 diabetes mellitus with other specified complication: Secondary | ICD-10-CM

## 2020-05-09 DIAGNOSIS — Z0000 Encounter for general adult medical examination without abnormal findings: Secondary | ICD-10-CM | POA: Diagnosis not present

## 2020-05-09 DIAGNOSIS — Z79899 Other long term (current) drug therapy: Secondary | ICD-10-CM

## 2020-05-09 NOTE — Progress Notes (Signed)
COMPLETE PHYSICAL   Assessment and Plan:  Encounter for general adult medical examination with abnormal findings 1 year  Essential hypertension - continue medications, DASH diet, exercise and monitor at home. Call if greater than 130/80.  -     CBC with Diff -     COMPLETE METABOLIC PANEL WITH GFR -     TSH -     Urinalysis, Routine w reflex microscopic -     Microalbumin / Creatinine Urine Ratio -     EKG 12-Lead  Mixed hyperlipidemia -     Lipid Profile check lipids decrease fatty foods increase activity.  Aortic atherosclerosis ([facility]) Per CT Abdomen 09/2019 Control blood pressure, cholesterol, glucose, increase exercise.    Type 2 diabetes mellitus with hyperlipidemia (HCC) -     Hemoglobin A1c ([facility]) - patient has lost weight, now in normal range, will continue to monitor  Hypothyroidism, unspecified type Taking levothyroxine 114mcg mcg daily Reminder to take on an empty stomach 30-38mins before first meal of the day. No antacid medications for 4 hours. -     TSH  Morbid obesity (HCC) -     lisdexamfetamine (VYVANSE) 60 MG capsule; Take 1 capsule (60 mg total) by mouth every morning. Has + binge eating Has tried and failed phentermine, contrave, qysema, belviq, can not afford saxenda, topamax had AE's.  Suggest counseling/noom  Other migraine without status migrainosus, not intractable Doing well Avoid triggers  Screening, ischemic heart disease -     EKG 12-Lead  Screening, iron deficiency anemia -     Iron,Total/Total Iron Binding Cap  Screening for blood or protein in urine -     Urinalysis w microscopic + reflex cultur  Encounter for vitamin deficiency screening -     Vitamin B12  Medication management -     Magnesium   Discussed med's effects and SE's. Screening labs and tests as requested with regular follow-up as recommended.  Further disposition pending results if labs check today. Discussed med's effects and SE's.   Over 30 minutes of  face to face interview, exam, counseling, chart review, and critical decision making was performed.    HPI  60 y.o. female  presents for a complete physical.  She reports that she has been taking metformin since she was 60years old for PCOS. She would like to try Trulicity or Ozempic.       She states since wearing her masks she has had a bump on her upper right lip. Will get worse throughout the day and get better over night.    She went to [facility] and while carrying her bags through the airport and put it in the trunk of her car, she hurt her back. She is has been having left leg pain. Has had pain x Dec 6th.  Has been taking motrin, BC powder without help. She has taken gabapentin PRN.  Has not been using regularly.  Her blood pressure has been controlled at home, today their BP is BP: 140/86. She does not workout. She denies chest pain, shortness of breath, dizziness.    BMI is Body mass index is 33.44 kg/m., she is working on diet and exercise. Phentermine did not help unless she took a 1.5 pills, she could not tolerate belviq, she states the qysimia made her sleepy, topiramate she had side effects. She did not do well with contrave. She could not afford saxenda.  Wt Readings from Last 3 Encounters:  05/09/20 177 lb (80.3 kg)  12/29/19 174 lb (78.9  kg)  10/10/19 186 lb (84.4 kg)   She has chronic constipation,  colonoscopy 2012. States bowels changed after choley. Miralax is nightly and metamucil gave her gas.   She is on cholesterol medication and denies myalgias. Her cholesterol is at goal. The cholesterol last visit was:  Lab Results  Component Value Date   CHOL 186 12/29/2019   HDL 47 (L) 12/29/2019   LDLCALC 119 (H) 12/29/2019   TRIG 94 12/29/2019   CHOLHDL 4.0 12/29/2019  . She has been working on diet and exercise for DM with hyperlipidemia, started on metformin and invokana and now in normal range, she is no longer on invokana due to insurance. She would like to get on DM  medication for weight loss, saxenda would not help. she is on bASA, she is on ACE/ARB and denies foot ulcerations, hyperglycemia, hypoglycemia , increased appetite, nausea, paresthesia of the feet, polydipsia, polyuria, visual disturbances, vomiting and weight loss. Last A1C in the office was:  Lab Results  Component Value Date   HGBA1C 5.6 12/29/2019   Lab Results  Component Value Date   GFRAA 115 12/29/2019   Patient is on Vitamin D supplement.   Lab Results  Component Value Date   VD25OH 44 08/23/2019     She is on thyroid medication. Her medication was not changed last visit.  She tried armour thyroid but states she was too tired.  Lab Results  Component Value Date   TSH 0.49 12/29/2019  .    Current Medications:   Current Outpatient Medications (Endocrine & Metabolic):  Marland Kitchen  AMABELZ 1-0.5 MG tablet, TAKE 1 TABLET BY MOUTH  DAILY .  levothyroxine (SYNTHROID) 100 MCG tablet, TAKE 1 TABLET DAILY ON AN  EMPTY STOMACH WITH WATER  ONLY FOR 30 MIN AND NO  ANTACIDS CALCIUM  MAGNESIUM FOR 4 HRS. AVOID BIOTIN .  metFORMIN (GLUCOPHAGE) 1000 MG tablet, TAKE 1 TABLET TWICE DAILY FOR DIABETES  Current Outpatient Medications (Cardiovascular):  .  losartan-hydrochlorothiazide (HYZAAR) 100-25 MG tablet, TAKE 1 TABLET BY MOUTH  DAILY FOR BLOOD PRESSURE  Current Outpatient Medications (Respiratory):  .  fluticasone (FLONASE) 50 MCG/ACT nasal spray, Place 2 sprays into both nostrils at bedtime.  Current Outpatient Medications (Analgesics):  Marland Kitchen  Aspirin (STANBACK HEADACHE POWDER PO), Take by mouth. Marland Kitchen  ibuprofen (ADVIL) 800 MG tablet, TAKE 1 TABLET BY MOUTH EVERY 8 HOURS WITH FOOD AS NEEDED FOR PAIN AND INFLAMMATION   Current Outpatient Medications (Other):  Marland Kitchen  CALCIUM-VITAMIN D PO, Take 1 tablet by mouth daily. .  cyclobenzaprine (FLEXERIL) 10 MG tablet, Take 1 tablet (10 mg total) by mouth 3 (three) times daily as needed for muscle spasms. Marland Kitchen  gabapentin (NEURONTIN) 300 MG capsule, Take 1  capsule (300 mg total) by mouth 3 (three) times daily. Marland Kitchen  GARLIC PO, Take 1 tablet by mouth 2 (two) times daily. Marland Kitchen  lisdexamfetamine (VYVANSE) 60 MG capsule, Take 1 capsule (60 mg total) by mouth every morning. Marland Kitchen  LYSINE PO, Take 1 tablet by mouth at bedtime. .  Multiple Vitamins-Minerals (MULTIVITAMIN WITH MINERALS) tablet, Take 1 tablet by mouth daily. .  pantoprazole (PROTONIX) 40 MG tablet, Take 1 tablet Daily to Prevent  Indigestion & Heartburn .  temazepam (RESTORIL) 30 MG capsule, TAKE 1 CAPSULE BY MOUTH EVERY NIGHT AT BEDTIME AS NEEDED FOR INSOMNIA .  valACYclovir (VALTREX) 500 MG tablet, TAKE 1 TABLET BY MOUTH  TWICE DAILY AS NEEDED FOR  COLD SORES / FEVER BLISTERS  Health Maintenance:   Immunization History  Administered Date(s) Administered  . Influenza,inj,quad, With Preservative 03/18/2016  . Influenza-Unspecified 02/26/2015, 02/08/2018  . PFIZER SARS-COV-2 Vaccination 07/09/2019, 07/30/2019  . Tdap 07/08/2011  . Zoster 03/21/2014    Tetanus: 2013 Flu vaccine: 2020 at work Zostavax: 2015- wants new one  Pap: 2017 MGM:01/2020  DEXA: 2013 Colonoscopy: 2012 CT AB 05/2018 US thyroid 03/2018 Last Eye Exam:  Dr. Gershon Crane 2017  Patient Care Team: Unk Pinto, MD as PCP - General (Internal Medicine) Servando Salina, MD as Consulting Physician (Obstetrics and Gynecology)  Medical History:  Past Medical History:  Diagnosis Date  . Arthritis   . Constipation, chronic   . Diverticulitis   . Diverticulosis   . Fibroid uterus   . History of gallstones   . Hyperlipidemia   . Hypertension   . Hypothyroidism   . Migraine   . PCOS (polycystic ovarian syndrome)    takes metformin for this  . Prediabetes   . Thyroid disease   . Type II or unspecified type diabetes mellitus without mention of complication, not stated as uncontrolled    Allergies No Known Allergies  SURGICAL HISTORY She  has a past surgical history that includes Breast reduction surgery  (1988); Cholecystectomy (1995); Tonsillectomy and adenoidectomy (1990's); LASIK (Left); Refractive surgery (Left); Breast surgery; Eye surgery; and Reduction mammaplasty. FAMILY HISTORY Her family history includes COPD in her mother; Diabetes in her mother and sister; Diverticulitis in her mother; Heart disease in her mother; Leukemia in her mother; Lung cancer in her mother. SOCIAL HISTORY She  reports that she has never smoked. She has never used smokeless tobacco. She reports current alcohol use. She reports that she does not use drugs.   Review of Systems: Review of Systems  Constitutional: Negative for chills, fever and malaise/fatigue.  HENT: Negative for congestion, ear pain and sore throat.   Eyes: Negative.   Respiratory: Negative for cough, shortness of breath and wheezing.   Cardiovascular: Negative for chest pain, palpitations and leg swelling.  Gastrointestinal: Positive for abdominal pain. Negative for blood in stool, constipation, diarrhea, heartburn, melena, nausea and vomiting.  Genitourinary: Negative.   Musculoskeletal: Positive for back pain.  Skin: Negative.   Neurological: Negative for dizziness, sensory change, loss of consciousness and headaches.  Psychiatric/Behavioral: Negative for depression. The patient is not nervous/anxious and does not have insomnia.     Physical Exam: Estimated body mass index is 33.44 kg/m as calculated from the following:   Height as of this encounter: 5\' 1"  (1.549 m).   Weight as of this encounter: 177 lb (80.3 kg). BP 140/86   Pulse 76   Temp 97.6 F (36.4 C)   Ht 5\' 1"  (1.549 m)   Wt 177 lb (80.3 kg)   SpO2 96%   BMI 33.44 kg/m   General Appearance: Well nourished well developed, in no apparent distress.  Eyes: PERRLA, EOMs, conjunctiva no swelling or erythema ENT/Mouth: Ear canals normal without obstruction, swelling, erythema, or discharge.  TMs normal bilaterally with no erythema, bulging, retraction, or loss of landmark.   Oropharynx moist and clear with no exudate, erythema, or swelling.   Neck: Supple, thyroid nodule left side. No bruits.  No cervical adenopathy Respiratory: Respiratory effort normal, Breath sounds clear A&P without wheeze, rhonchi, rales.   Cardio: RRR without murmurs, rubs or gallops. Brisk peripheral pulses without edema.  Chest: symmetric, with normal excursions Breasts: Deferred to obgyn Abdomen: Soft, obese, + LLQ tenderness, no guarding, rebound, hernias, masses, or organomegaly.  Lymphatics: Non tender without lymphadenopathy.  Musculoskeletal:  Full ROM all peripheral extremities,5/5 strength, and normal gait. Patient is able to ambulate well. Gait is  Antalgic. Straight leg raising with dorsiflexion on the left with pain mid back, negative on the right. Sensory exam in the legs are normal. Knee reflexes are normal Ankle reflexes are normal Strength is normal and symmetric in arms and legs. There is not SI tenderness to palpation.  There is paraspinal muscle spasm.  There is not midline tenderness.  ROM of spine with  limited in all spheres due to pain.  Skin: Warm, dry without rashes, lesions, ecchymosis. Neuro: Awake and oriented X 3, Cranial nerves intact, reflexes equal bilaterally. Normal muscle tone, no cerebellar symptoms. Sensation intact.  Psych:  normal affect, Insight and Judgment appropriate.   EKG: WNL  no ST changes.   Garnet Sierras, NP 2:31 PM [facility]

## 2020-05-10 LAB — CBC WITH DIFFERENTIAL/PLATELET
Absolute Monocytes: 545 cells/uL (ref 200–950)
Basophils Absolute: 62 cells/uL (ref 0–200)
Basophils Relative: 0.9 %
Eosinophils Absolute: 173 cells/uL (ref 15–500)
Eosinophils Relative: 2.5 %
HCT: 37.2 % (ref 35.0–45.0)
Hemoglobin: 12 g/dL (ref 11.7–15.5)
Lymphs Abs: 1849 cells/uL (ref 850–3900)
MCH: 28.3 pg (ref 27.0–33.0)
MCHC: 32.3 g/dL (ref 32.0–36.0)
MCV: 87.7 fL (ref 80.0–100.0)
MPV: 9.5 fL (ref 7.5–12.5)
Monocytes Relative: 7.9 %
Neutro Abs: 4271 cells/uL (ref 1500–7800)
Neutrophils Relative %: 61.9 %
Platelets: 369 10*3/uL (ref 140–400)
RBC: 4.24 10*6/uL (ref 3.80–5.10)
RDW: 12 % (ref 11.0–15.0)
Total Lymphocyte: 26.8 %
WBC: 6.9 10*3/uL (ref 3.8–10.8)

## 2020-05-10 LAB — COMPLETE METABOLIC PANEL WITH GFR
AG Ratio: 1.7 (calc) (ref 1.0–2.5)
ALT: 15 U/L (ref 6–29)
AST: 12 U/L (ref 10–35)
Albumin: 4.1 g/dL (ref 3.6–5.1)
Alkaline phosphatase (APISO): 57 U/L (ref 37–153)
BUN: 9 mg/dL (ref 7–25)
CO2: 33 mmol/L — ABNORMAL HIGH (ref 20–32)
Calcium: 9.7 mg/dL (ref 8.6–10.4)
Chloride: 103 mmol/L (ref 98–110)
Creat: 0.66 mg/dL (ref 0.50–1.05)
GFR, Est African American: 112 mL/min/{1.73_m2} (ref >=60)
GFR, Est Non African American: 97 mL/min/{1.73_m2} (ref >=60)
Globulin: 2.4 g/dL (calc) (ref 1.9–3.7)
Glucose, Bld: 131 mg/dL — ABNORMAL HIGH (ref 65–99)
Potassium: 4.1 mmol/L (ref 3.5–5.3)
Sodium: 142 mmol/L (ref 135–146)
Total Bilirubin: 0.3 mg/dL (ref 0.2–1.2)
Total Protein: 6.5 g/dL (ref 6.1–8.1)

## 2020-05-10 LAB — URINALYSIS W MICROSCOPIC + REFLEX CULTURE
Bacteria, UA: NONE SEEN /HPF
Bilirubin Urine: NEGATIVE
Glucose, UA: NEGATIVE
Hgb urine dipstick: NEGATIVE
Hyaline Cast: NONE SEEN /LPF
Ketones, ur: NEGATIVE
Leukocyte Esterase: NEGATIVE
Nitrites, Initial: NEGATIVE
Protein, ur: NEGATIVE
Specific Gravity, Urine: 1.014 (ref 1.001–1.03)
Squamous Epithelial / HPF: NONE SEEN /HPF (ref <=5)
WBC, UA: NONE SEEN /HPF (ref 0–5)
pH: >=8.5 — AB (ref 5.0–8.0)

## 2020-05-10 LAB — HEMOGLOBIN A1C
Hgb A1c MFr Bld: 5.8 % of total Hgb — ABNORMAL HIGH (ref <5.7)
Mean Plasma Glucose: 120 mg/dL
eAG (mmol/L): 6.6 mmol/L

## 2020-05-10 LAB — LIPID PANEL
Cholesterol: 173 mg/dL (ref <200)
HDL: 48 mg/dL — ABNORMAL LOW (ref >=50)
LDL Cholesterol (Calc): 104 mg/dL (calc) — ABNORMAL HIGH
Non-HDL Cholesterol (Calc): 125 mg/dL (calc) (ref <130)
Total CHOL/HDL Ratio: 3.6 (calc) (ref <5.0)
Triglycerides: 113 mg/dL (ref <150)

## 2020-05-10 LAB — NO CULTURE INDICATED

## 2020-05-10 LAB — IRON, TOTAL/TOTAL IRON BINDING CAP
%SAT: 6 % (calc) — ABNORMAL LOW (ref 16–45)
Iron: 27 ug/dL — ABNORMAL LOW (ref 45–160)
TIBC: 435 mcg/dL (calc) (ref 250–450)

## 2020-05-10 LAB — VITAMIN D 25 HYDROXY (VIT D DEFICIENCY, FRACTURES): Vit D, 25-Hydroxy: 137 ng/mL — ABNORMAL HIGH (ref 30–100)

## 2020-05-10 LAB — TSH: TSH: 0.55 mIU/L (ref 0.40–4.50)

## 2020-05-10 LAB — VITAMIN B12: Vitamin B-12: 660 pg/mL (ref 200–1100)

## 2020-05-16 ENCOUNTER — Other Ambulatory Visit: Payer: Self-pay | Admitting: Adult Health Nurse Practitioner

## 2020-05-22 ENCOUNTER — Other Ambulatory Visit: Payer: Self-pay | Admitting: Adult Health Nurse Practitioner

## 2020-05-22 DIAGNOSIS — E559 Vitamin D deficiency, unspecified: Secondary | ICD-10-CM

## 2020-05-22 DIAGNOSIS — E785 Hyperlipidemia, unspecified: Secondary | ICD-10-CM

## 2020-05-22 DIAGNOSIS — E1169 Type 2 diabetes mellitus with other specified complication: Secondary | ICD-10-CM

## 2020-05-22 MED ORDER — OZEMPIC (0.25 OR 0.5 MG/DOSE) 2 MG/1.5ML ~~LOC~~ SOPN: 0.5000 mg | PEN_INJECTOR | SUBCUTANEOUS | 1 refills | Status: DC

## 2020-05-26 ENCOUNTER — Other Ambulatory Visit: Payer: Self-pay | Admitting: Internal Medicine

## 2020-05-26 DIAGNOSIS — I10 Essential (primary) hypertension: Secondary | ICD-10-CM

## 2020-05-26 MED ORDER — OLMESARTAN MEDOXOMIL-HCTZ 40-25 MG PO TABS: ORAL_TABLET | ORAL | 0 refills | Status: DC

## 2020-05-30 ENCOUNTER — Other Ambulatory Visit: Payer: Self-pay | Admitting: Adult Health Nurse Practitioner

## 2020-06-01 ENCOUNTER — Other Ambulatory Visit: Payer: Self-pay | Admitting: Internal Medicine

## 2020-06-01 DIAGNOSIS — I10 Essential (primary) hypertension: Secondary | ICD-10-CM

## 2020-06-01 MED ORDER — OLMESARTAN MEDOXOMIL-HCTZ 40-25 MG PO TABS
ORAL_TABLET | ORAL | 0 refills | Status: DC
Start: 2020-06-01 — End: 2020-07-06

## 2020-06-17 ENCOUNTER — Other Ambulatory Visit: Payer: Self-pay | Admitting: Adult Health

## 2020-06-27 ENCOUNTER — Other Ambulatory Visit: Payer: Self-pay | Admitting: Adult Health Nurse Practitioner

## 2020-06-27 MED ORDER — LISDEXAMFETAMINE DIMESYLATE 60 MG PO CAPS: 60.0000 mg | ORAL_CAPSULE | ORAL | 0 refills | Status: DC

## 2020-07-06 ENCOUNTER — Other Ambulatory Visit: Payer: Self-pay | Admitting: Adult Health Nurse Practitioner

## 2020-07-06 DIAGNOSIS — I10 Essential (primary) hypertension: Secondary | ICD-10-CM

## 2020-07-06 MED ORDER — OLMESARTAN MEDOXOMIL-HCTZ 40-25 MG PO TABS: ORAL_TABLET | ORAL | 3 refills | Status: DC

## 2020-07-23 ENCOUNTER — Other Ambulatory Visit: Payer: Self-pay | Admitting: Adult Health

## 2020-07-25 ENCOUNTER — Encounter: Payer: Self-pay | Admitting: Adult Health

## 2020-07-25 NOTE — Progress Notes (Signed)
Assessment and Plan:   Morbid obesity ([place]) - follow up 3 months for progress monitoring - continue vyvanse of binge eating- doing very well - increase veggies, decrease carbs - long discussion about weight loss, diet, and exercise  Essential hypertension -     COMPLETE METABOLIC PANEL WITH GFR - continue current medications, DASH diet, exercise and monitor at home. Call if greater than 130/80.   Snoring/ Daily AM headache Reports AM headache for 10+ years, has seen HA clinic, reports did have MRI that was normal  With morbid obesity, snoring, sleep irregularities poorly responsive to meds; has never had sleep study Discussed and will refer to Dr. Brett Fairy for possible sleep apnea and recommendations -     Ambulatory referral to Neurology  Other orders -     estradiol-norethindrone (ACTIVELLA) 1-0.5 MG tablet; Take 1 tablet by mouth daily.   Future Appointments  Date Time Provider [place]  09/20/2020  4:00 PM Liane Comber, NP [facility] None  05/13/2021  2:00 PM McClanahan, Danton Sewer, NP [facility] None     HPI BP 114/80   Pulse 98   Temp (!) 97.3 F (36.3 C)   Wt 183 lb (83 kg)   SpO2 98%   BMI 34.58 kg/m   60 y.o.female presents for follow up due to some med concerns.   She was recently switched from losartan/HCTZ to olmesartan/HCTZ. She was concerned due to odd odor from pill bottle. No significant odd odor noted by provider today. Reassured.   Her blood pressure has been controlled at home, today their BP is BP: 114/80  She does not workout. She denies chest pain, shortness of breath, dizziness.  BP Readings from Last 3 Encounters:  07/26/20 114/80  05/09/20 140/86  12/29/19 122/70   BMI is Body mass index is 34.58 kg/m., she is working on diet and exercise. - Has tried and failed phentermine, contrave, qysema, belviq, can not afford saxenda, topamax had AE's. She has been on vyvanse for binge eating and this does seem to be helpful. Was started on  ozempic samples, she reports started having HA and hoarseness x 3 weeks that she correlated to med and stopped last week. Sx do seem improved. However reports ongoing daily AM headache lasting several hours and AM somnolence for many years, does snore despite uvulectomy remotely for this. Long hx of sleep irregularity.  Wt Readings from Last 3 Encounters:  07/26/20 183 lb (83 kg)  05/09/20 177 lb (80.3 kg)  12/29/19 174 lb (78.9 kg)    She is on cholesterol medication and denies myalgias. Her cholesterol is at goal. The cholesterol last visit was:   Lab Results  Component Value Date   CHOL 173 05/09/2020   HDL 48 (L) 05/09/2020   LDLCALC 104 (H) 05/09/2020   TRIG 113 05/09/2020   CHOLHDL 3.6 05/09/2020    She has been working on diet for diabetes, eats out but tries to choose veggies,, she is on metformin for PCOS and denies paresthesia of the feet, polydipsia, polyuria and visual disturbances. Last A1C in the office was:  Lab Results  Component Value Date   HGBA1C 5.8 (H) 05/09/2020   Patient is on Vitamin D supplement, taking 10000 IU daily, didn't reduce. Advised hold for 1 month then restart every other day.  Lab Results  Component Value Date   VD25OH 137 (H) 05/09/2020     Past Medical History:  Diagnosis Date  . Arthritis   . Constipation, chronic   . Diverticulitis   .  Diverticulosis   . Fibroid uterus   . History of gallstones   . Hyperlipidemia   . Hypertension   . Hypothyroidism   . Migraine   . PCOS (polycystic ovarian syndrome)    takes metformin for this  . Prediabetes   . Thyroid disease      No Known Allergies     Current Outpatient Medications (Endocrine & Metabolic):  .  levothyroxine (SYNTHROID) 100 MCG tablet, TAKE 1 TABLET DAILY ON AN  EMPTY STOMACH WITH WATER  ONLY FOR 30 MIN AND NO  ANTACIDS CALCIUM  MAGNESIUM FOR 4 HRS. AVOID BIOTIN .  metFORMIN (GLUCOPHAGE) 1000 MG tablet, TAKE 1 TABLET TWICE DAILY FOR DIABETES .  estradiol-norethindrone  (ACTIVELLA) 1-0.5 MG tablet, Take 1 tablet by mouth daily. .  Semaglutide,0.25 or 0.5MG /DOS, (OZEMPIC, 0.25 OR 0.5 MG/DOSE,) 2 MG/1.5ML SOPN, Inject 0.5 mg into the skin once a week. (Patient not taking: Reported on 07/26/2020)  Current Outpatient Medications (Cardiovascular):  .  olmesartan-hydrochlorothiazide (BENICAR HCT) 40-25 MG tablet, Take  1 tablet  Daily  for BP   (Replaces Losartan Hct)  Current Outpatient Medications (Respiratory):  .  fluticasone (FLONASE) 50 MCG/ACT nasal spray, Place 2 sprays into both nostrils at bedtime. (Patient not taking: Reported on 07/26/2020)  Current Outpatient Medications (Analgesics):  Marland Kitchen  Aspirin (STANBACK HEADACHE POWDER PO), Take by mouth. Marland Kitchen  ibuprofen (ADVIL) 800 MG tablet, TAKE 1 TABLET BY MOUTH EVERY 8 HOURS WITH FOOD AS NEEDED FOR PAIN AND INFLAMMATION   Current Outpatient Medications (Other):  Marland Kitchen  CALCIUM-VITAMIN D PO, Take 1 tablet by mouth daily. .  cyclobenzaprine (FLEXERIL) 10 MG tablet, Take 1 tablet (10 mg total) by mouth 3 (three) times daily as needed for muscle spasms. Marland Kitchen  gabapentin (NEURONTIN) 300 MG capsule, Take 1 capsule (300 mg total) by mouth 3 (three) times daily. Marland Kitchen  GARLIC PO, Take 1 tablet by mouth 2 (two) times daily. Marland Kitchen  lisdexamfetamine (VYVANSE) 60 MG capsule, Take 1 capsule (60 mg total) by mouth every morning. Marland Kitchen  LYSINE PO, Take 1 tablet by mouth at bedtime. .  Multiple Vitamins-Minerals (MULTIVITAMIN WITH MINERALS) tablet, Take 1 tablet by mouth daily. .  pantoprazole (PROTONIX) 40 MG tablet, Take 1 tablet Daily to Prevent  Indigestion & Heartburn .  temazepam (RESTORIL) 30 MG capsule, TAKE 1 CAPSULE BY MOUTH EVERY NIGHT AT BEDTIME AS NEEDED FOR INSOMNIA .  valACYclovir (VALTREX) 500 MG tablet, TAKE 1 TABLET BY MOUTH  TWICE DAILY AS NEEDED FOR  COLD SORES / FEVER BLISTERS  ROS: all negative except above.   Physical Exam: Filed Weights   07/26/20 1035  Weight: 183 lb (83 kg)   BP 114/80   Pulse 98   Temp (!) 97.3  F (36.3 C)   Wt 183 lb (83 kg)   SpO2 98%   BMI 34.58 kg/m  General Appearance: Well nourished, in no apparent distress. Eyes: PERRLA, EOMs, conjunctiva no swelling or erythema Sinuses: No Frontal/maxillary tenderness ENT/Mouth: Ext aud canals clear, TMs without erythema, bulging. No erythema, swelling, or exudate on post pharynx.  Tonsils not visible, uvula absent. Hearing normal.  Neck: Supple, thyroid normal.  Respiratory: Respiratory effort normal, BS equal bilaterally without rales, rhonchi, wheezing or stridor.  Cardio: RRR with no MRGs. Brisk peripheral pulses without edema.  Abdomen: Soft, obese, + BS.  Non tender, no guarding, rebound, hernias, masses. Lymphatics: Non tender without lymphadenopathy.  Musculoskeletal: Full ROM, 5/5 strength, normal gait.  Skin: Warm, dry without rashes, lesions, ecchymosis.  Neuro:  Cranial nerves intact. Normal muscle tone, no cerebellar symptoms. Sensation intact.  Psych: Awake and oriented X 3, normal affect, tearful, Insight and Judgment appropriate.     Izora Ribas, NP 12:20 PM [facility]

## 2020-07-26 ENCOUNTER — Ambulatory Visit (INDEPENDENT_AMBULATORY_CARE_PROVIDER_SITE_OTHER): Payer: 59 | Admitting: Adult Health

## 2020-07-26 ENCOUNTER — Other Ambulatory Visit: Payer: Self-pay

## 2020-07-26 ENCOUNTER — Encounter: Payer: Self-pay | Admitting: Adult Health

## 2020-07-26 VITALS — BP 114/80 | HR 98 | Temp 97.3°F | Wt 183.0 lb

## 2020-07-26 DIAGNOSIS — I700 Atherosclerosis of aorta: Secondary | ICD-10-CM

## 2020-07-26 DIAGNOSIS — Z79899 Other long term (current) drug therapy: Secondary | ICD-10-CM | POA: Diagnosis not present

## 2020-07-26 DIAGNOSIS — I10 Essential (primary) hypertension: Secondary | ICD-10-CM

## 2020-07-26 DIAGNOSIS — E1169 Type 2 diabetes mellitus with other specified complication: Secondary | ICD-10-CM

## 2020-07-26 DIAGNOSIS — E559 Vitamin D deficiency, unspecified: Secondary | ICD-10-CM

## 2020-07-26 DIAGNOSIS — R519 Headache, unspecified: Secondary | ICD-10-CM

## 2020-07-26 DIAGNOSIS — R0683 Snoring: Secondary | ICD-10-CM

## 2020-07-26 DIAGNOSIS — E785 Hyperlipidemia, unspecified: Secondary | ICD-10-CM

## 2020-07-26 DIAGNOSIS — E039 Hypothyroidism, unspecified: Secondary | ICD-10-CM

## 2020-07-26 DIAGNOSIS — R7309 Other abnormal glucose: Secondary | ICD-10-CM

## 2020-07-26 MED ORDER — ESTRADIOL-NORETHINDRONE ACET 1-0.5 MG PO TABS: 1.0000 | ORAL_TABLET | Freq: Every day | ORAL | 1 refills | Status: DC

## 2020-07-27 LAB — COMPLETE METABOLIC PANEL WITH GFR
AG Ratio: 1.6 (calc) (ref 1.0–2.5)
ALT: 17 U/L (ref 6–29)
AST: 13 U/L (ref 10–35)
Albumin: 4.1 g/dL (ref 3.6–5.1)
Alkaline phosphatase (APISO): 63 U/L (ref 37–153)
BUN: 12 mg/dL (ref 7–25)
CO2: 27 mmol/L (ref 20–32)
Calcium: 9.9 mg/dL (ref 8.6–10.4)
Chloride: 105 mmol/L (ref 98–110)
Creat: 0.59 mg/dL (ref 0.50–0.99)
GFR, Est African American: 115 mL/min/{1.73_m2} (ref >=60)
GFR, Est Non African American: 100 mL/min/{1.73_m2} (ref >=60)
Globulin: 2.6 g/dL (calc) (ref 1.9–3.7)
Glucose, Bld: 86 mg/dL (ref 65–99)
Potassium: 4.2 mmol/L (ref 3.5–5.3)
Sodium: 140 mmol/L (ref 135–146)
Total Bilirubin: 0.3 mg/dL (ref 0.2–1.2)
Total Protein: 6.7 g/dL (ref 6.1–8.1)

## 2020-08-01 ENCOUNTER — Other Ambulatory Visit: Payer: Self-pay | Admitting: Internal Medicine

## 2020-08-01 DIAGNOSIS — I10 Essential (primary) hypertension: Secondary | ICD-10-CM

## 2020-09-13 ENCOUNTER — Other Ambulatory Visit: Payer: Self-pay | Admitting: Adult Health

## 2020-09-13 ENCOUNTER — Other Ambulatory Visit: Payer: Self-pay | Admitting: Adult Health Nurse Practitioner

## 2020-09-13 DIAGNOSIS — E785 Hyperlipidemia, unspecified: Secondary | ICD-10-CM

## 2020-09-13 DIAGNOSIS — E1169 Type 2 diabetes mellitus with other specified complication: Secondary | ICD-10-CM

## 2020-09-14 ENCOUNTER — Telehealth: Payer: Self-pay

## 2020-09-14 ENCOUNTER — Other Ambulatory Visit: Payer: Self-pay | Admitting: Adult Health

## 2020-09-14 MED ORDER — LISDEXAMFETAMINE DIMESYLATE 60 MG PO CAPS: 60.0000 mg | ORAL_CAPSULE | ORAL | 0 refills | Status: DC

## 2020-09-14 NOTE — Progress Notes (Signed)
Future Appointments  Date Time Provider [place]  09/20/2020  4:00 PM Liane Comber, NP [facility] None  05/13/2021  2:00 PM Garnet Sierras, NP [facility] None    PDMP reviewed for adderall refill request.

## 2020-09-14 NOTE — Telephone Encounter (Signed)
Refill request for Vyvanse. States that insurance won't cover it if it has refills.

## 2020-09-20 ENCOUNTER — Ambulatory Visit (INDEPENDENT_AMBULATORY_CARE_PROVIDER_SITE_OTHER): Payer: 59 | Admitting: Adult Health

## 2020-09-20 ENCOUNTER — Encounter: Payer: Self-pay | Admitting: Adult Health

## 2020-09-20 ENCOUNTER — Other Ambulatory Visit: Payer: Self-pay

## 2020-09-20 VITALS — BP 138/82 | HR 95 | Temp 95.7°F | Wt 180.0 lb

## 2020-09-20 DIAGNOSIS — E039 Hypothyroidism, unspecified: Secondary | ICD-10-CM | POA: Diagnosis not present

## 2020-09-20 DIAGNOSIS — R7309 Other abnormal glucose: Secondary | ICD-10-CM | POA: Diagnosis not present

## 2020-09-20 DIAGNOSIS — E559 Vitamin D deficiency, unspecified: Secondary | ICD-10-CM

## 2020-09-20 DIAGNOSIS — I700 Atherosclerosis of aorta: Secondary | ICD-10-CM | POA: Diagnosis not present

## 2020-09-20 DIAGNOSIS — Z79899 Other long term (current) drug therapy: Secondary | ICD-10-CM

## 2020-09-20 DIAGNOSIS — G43809 Other migraine, not intractable, without status migrainosus: Secondary | ICD-10-CM

## 2020-09-20 DIAGNOSIS — I10 Essential (primary) hypertension: Secondary | ICD-10-CM

## 2020-09-20 MED ORDER — NURTEC 75 MG PO TBDP: ORAL_TABLET | ORAL | 0 refills | Status: AC

## 2020-09-20 MED ORDER — FAMOTIDINE 40 MG PO TABS
40.0000 mg | ORAL_TABLET | Freq: Every evening | ORAL | 3 refills | Status: DC
Start: 2020-09-20 — End: 2021-01-29

## 2020-09-20 NOTE — Progress Notes (Signed)
3 MONTH FOLLOW UP   Assessment and Plan:   Aortic atherosclerosis ([place]) Per CT Abdomen 09/2019 Control blood pressure, cholesterol, glucose, increase exercise.   Essential hypertension - continue medications, DASH diet, exercise and monitor at home. Call if greater than 130/80.  -     CBC with Diff -     COMPLETE METABOLIC PANEL WITH GFR  Mixed hyperlipidemia decrease fatty foods (saturated fat) increase fiber increase activity. -     Lipid Profile  PCOS/other abnormal glucose (prediabetes) Discussed disease and risks Discussed diet/exercise, weight management  Check A1C q49m;  - patient has lost weight, now in normal range, will continue to monitor -     Hemoglobin A1c ([facility])  Hypothyroidism, unspecified type Continue current medications Reminder to take on an empty stomach 30-56mins before first meal of the day. No antacid medications for 4 hours. -     TSH  Morbid obesity ([place]) Has + binge eating Has tried and failed phentermine, contrave, qysema, belviq, can not afford saxenda, topamax had AE's.  Improved with vyvanse and ozempic; continue 0.5 mg/week dose x 4-6 weeks then increase to 1 mg/week  Fiber discussed; handout given Print and bring menu of restaurants she frequents next visit and will review best options together -     lisdexamfetamine (VYVANSE) 60 MG capsule; Take 1 capsule (60 mg total) by mouth every morning.  Other migraine without status migrainosus, not intractable Frequent HA, using excess NSAID, has seen HA clinic with unremarkable workup in the past; discussed new med options; may benefit from nurtec - samples given Has failed imitrex, will check at home about other options she has tried and message back Disucssed identifying triggers; schedule sleep study   Medication management -     Magnesium   Discussed med's effects and SE's.  Further disposition pending results if labs check today. Discussed med's effects and SE's.   Over 30 minutes of  face to face interview, exam, counseling, chart review, and critical decision making was performed.   Future Appointments  Date Time Provider [place]  05/13/2021  2:00 PM McClanahan, Danton Sewer, NP [facility] None      HPI  60 y.o. female  presents for 6 month follow up on htn, hyperlipidemia, PCOS/anormal glucose, obesity, vitamin D.    She has frequent headaches, reports ongoing since a child. Has seen HA clinic with negative imaging, limited success with various meds. Does recall has failed imitrex. She is recently using BC powders or ibuprofen 800 mg, will often take on empty stomach due to nausea, will have photosensitivity. Some AM headaches, has been referred for sleep study and has number to schedule.   BMI is Body mass index is 34.01 kg/m., she has been working on diet, trying to make better choices, admits limited exercise. Limited benefit with phentermine, belvia, qysima, topiramate (SE), contrave, cost barrier with saxenda. Some benefit with vyvanse for binge eating behaviors. Now on ozempic, was off for several weeks when ill, just recently back on 0.5 mg/week, she is down 3 lb, does note some appetite benefit.  Reports has been doing [employer]irt recently. Admits doesn't cook at all, eats out for all meal.  Wt Readings from Last 3 Encounters:  09/20/20 180 lb (81.6 kg)  07/26/20 183 lb (83 kg)  05/09/20 177 lb (80.3 kg)   Her blood pressure has been controlled at home, today their BP is BP: 138/82. She does not workout. She denies chest pain, shortness of breath, dizziness.    She is  not on cholesterol medication and denies myalgias. Her cholesterol is at goal. The cholesterol last visit was:  Lab Results  Component Value Date   CHOL 173 05/09/2020   HDL 48 (L) 05/09/2020   LDLCALC 104 (H) 05/09/2020   TRIG 113 05/09/2020   CHOLHDL 3.6 05/09/2020  . She has been working on diet and exercise for PCOS, was on metoformin 2000 mg since her 20's, recently stopped and on ozempic  for weight loss. she is on bASA, she is on ACE/ARB and denies foot ulcerations, hyperglycemia, hypoglycemia , increased appetite, nausea, paresthesia of the feet, polydipsia, polyuria, visual disturbances, vomiting and weight loss. Last A1C in the office was:  Lab Results  Component Value Date   HGBA1C 5.8 (H) 05/09/2020   Patient is on Vitamin D supplement, reports was taking high dose supplement previous, now taking calcium+ D3 supplement only, ? 600 IU Lab Results  Component Value Date   VD25OH 137 (H) 05/09/2020     She is on thyroid medication. Her medication was not changed last visit.  She tried armour thyroid but states she was too tired. She is alternating 100 mcg and 50 mcg every other day, hasn't changed dose recently.  Lab Results  Component Value Date   TSH 0.55 05/09/2020  .    Current Medications:   Current Outpatient Medications (Endocrine & Metabolic):  .  estradiol-norethindrone (ACTIVELLA) 1-0.5 MG tablet, Take 1 tablet by mouth daily. Marland Kitchen  levothyroxine (SYNTHROID) 100 MCG tablet, TAKE 1 TABLET DAILY ON AN  EMPTY STOMACH WITH WATER  ONLY FOR 30 MIN AND NO  ANTACIDS CALCIUM  MAGNESIUM FOR 4 HRS. AVOID BIOTIN .  metFORMIN (GLUCOPHAGE) 1000 MG tablet, TAKE 1 TABLET TWICE DAILY FOR DIABETES .  OZEMPIC, 0.25 OR 0.5 MG/DOSE, 2 MG/1.5ML SOPN, INJECT 0.5MG  INTO THE SKIN ONCE A WEEK  Current Outpatient Medications (Cardiovascular):  .  olmesartan-hydrochlorothiazide (BENICAR HCT) 40-25 MG tablet, TAKE 1 TABLET BY MOUTH  DAILY FOR BLOOD PRESSURE ,  REPLACES LOSARTAN/HCTZ TAB  Current Outpatient Medications (Respiratory):  .  fluticasone (FLONASE) 50 MCG/ACT nasal spray, Place 2 sprays into both nostrils at bedtime.  Current Outpatient Medications (Analgesics):  Marland Kitchen  Aspirin (STANBACK HEADACHE POWDER PO), Take by mouth. Marland Kitchen  ibuprofen (ADVIL) 800 MG tablet, TAKE 1 TABLET BY MOUTH EVERY 8 HOURS WITH FOOD AS NEEDED FOR PAIN AND INFLAMMATION   Current Outpatient Medications  (Other):  Marland Kitchen  CALCIUM-VITAMIN D PO, Take 1 tablet by mouth daily. .  cyclobenzaprine (FLEXERIL) 10 MG tablet, Take 1 tablet (10 mg total) by mouth 3 (three) times daily as needed for muscle spasms. Marland Kitchen  gabapentin (NEURONTIN) 300 MG capsule, Take 1 capsule (300 mg total) by mouth 3 (three) times daily. Marland Kitchen  GARLIC PO, Take 1 tablet by mouth 2 (two) times daily. Marland Kitchen  lisdexamfetamine (VYVANSE) 60 MG capsule, Take 1 capsule (60 mg total) by mouth every morning. Marland Kitchen  LYSINE PO, Take 1 tablet by mouth at bedtime. .  Multiple Vitamins-Minerals (MULTIVITAMIN WITH MINERALS) tablet, Take 1 tablet by mouth daily. .  pantoprazole (PROTONIX) 40 MG tablet, Take 1 tablet Daily to Prevent  Indigestion & Heartburn .  temazepam (RESTORIL) 30 MG capsule, TAKE 1 CAPSULE BY MOUTH EVERY NIGHT AT BEDTIME AS NEEDED FOR INSOMNIA .  valACYclovir (VALTREX) 500 MG tablet, TAKE 1 TABLET BY MOUTH  TWICE DAILY AS NEEDED FOR  COLD SORES / FEVER BLISTERS  Medical History:  Past Medical History:  Diagnosis Date  . Arthritis   . Constipation,  chronic   . Diverticulitis   . Diverticulosis   . Fibroid uterus   . History of gallstones   . Hyperlipidemia   . Hypertension   . Hypothyroidism   . Migraine   . PCOS (polycystic ovarian syndrome)    takes metformin for this  . Prediabetes   . Thyroid disease    Allergies No Known Allergies  SURGICAL HISTORY She  has a past surgical history that includes Breast reduction surgery (1988); Cholecystectomy (1995); Tonsillectomy and adenoidectomy (1990's); LASIK (Left); Refractive surgery (Left); Breast surgery; Eye surgery; and Reduction mammaplasty. FAMILY HISTORY Her family history includes COPD in her mother; Diabetes in her mother and sister; Diverticulitis in her mother; Heart disease in her mother; Leukemia in her mother; Lung cancer in her mother. SOCIAL HISTORY She  reports that she has never smoked. She has never used smokeless tobacco. She reports current alcohol use. She  reports that she does not use drugs.   Review of Systems: Review of Systems  Constitutional: Negative for malaise/fatigue and weight loss.  HENT: Negative for congestion, hearing loss, sore throat and tinnitus.        Hoarseness  Eyes: Negative for blurred vision and double vision.  Respiratory: Negative for cough, shortness of breath and wheezing.   Cardiovascular: Negative for chest pain, palpitations, orthopnea, claudication and leg swelling.  Gastrointestinal: Negative for abdominal pain, blood in stool, constipation, diarrhea, heartburn, melena, nausea and vomiting.  Genitourinary: Negative.   Musculoskeletal: Negative for joint pain and myalgias.  Skin: Negative for rash.  Neurological: Positive for headaches (freqent headache and migraine). Negative for dizziness, tingling, sensory change and weakness.  Endo/Heme/Allergies: Negative for polydipsia.  Psychiatric/Behavioral: Negative.  Negative for depression and substance abuse. The patient is not nervous/anxious.   All other systems reviewed and are negative.   Physical Exam: Estimated body mass index is 34.01 kg/m as calculated from the following:   Height as of 05/09/20: 5\' 1"  (1.549 m).   Weight as of this encounter: 180 lb (81.6 kg). BP 138/82   Pulse 95   Temp (!) 95.7 F (35.4 C)   Wt 180 lb (81.6 kg)   SpO2 98%   BMI 34.01 kg/m   General Appearance: Well nourished well developed, in no apparent distress.  Eyes: PERRLA, EOMs, conjunctiva no swelling or erythema ENT/Mouth: Ear canals normal without obstruction, swelling, erythema, or discharge.  TMs normal bilaterally with no erythema, bulging, retraction, or loss of landmark.  Oropharynx moist and clear with no exudate, erythema, or swelling.   Neck: Supple, thyroid nodule left side. No bruits.  No cervical adenopathy Respiratory: Respiratory effort normal, Breath sounds clear A&P without wheeze, rhonchi, rales.   Cardio: RRR without murmurs, rubs or gallops. Brisk  peripheral pulses without edema.  Chest: symmetric, with normal excursions Abdomen: Soft, obese, non-tender, no guarding, rebound, hernias, masses, or organomegaly.  Lymphatics: Non tender without lymphadenopathy.  Musculoskeletal: Full ROM all peripheral extremities,5/5 strength, and normal gait. Skin: Warm, dry without rashes, lesions, ecchymosis. Neuro: Awake and oriented X 3, Cranial nerves intact, reflexes equal bilaterally. Normal muscle tone, no cerebellar symptoms. Sensation intact.  Psych:  normal affect, Insight and Judgment appropriate.     Izora Ribas, NP 4:26 PM [facility]

## 2020-09-20 NOTE — Patient Instructions (Addendum)
Try nurtec 1 tab under tongue if you wake up with a migraine  Please message with headache med you have tried  Recommend slowly increasing fiber intake - get as much from real food as possible  Beans, veggies, whole grains (old fashioned oats, brown rice, quinoa, farro, etc), nuts/seeds (ground flax seed, chia seeds), fruit - particularly berries are high fiber and lower carb    High-Fiber Eating Plan Fiber, also called dietary fiber, is a type of carbohydrate. It is found foods such as fruits, vegetables, whole grains, and beans. A high-fiber diet can have many health benefits. Your health care provider may recommend a high-fiber diet to help:  Prevent constipation. Fiber can make your bowel movements more regular.  Lower your cholesterol.  Relieve the following conditions: ? Inflammation of veins in the anus (hemorrhoids). ? Inflammation of specific areas of the digestive tract (uncomplicated diverticulosis). ? A problem of the large intestine, also called the colon, that sometimes causes pain and diarrhea (irritable bowel syndrome, or IBS).  Prevent overeating as part of a weight-loss plan.  Prevent heart disease, type 2 diabetes, and certain cancers. What are tips for following this plan? Reading food labels  Check the nutrition facts label on food products for the amount of dietary fiber. Choose foods that have 5 grams of fiber or more per serving.  The goals for recommended daily fiber intake include: ? Men (age 67 or younger): 34-38 g. ? Men (over age 20): 28-34 g. ? Women (age 90 or younger): 25-28 g. ? Women (over age 75): 22-25 g. Your daily fiber goal is _____________ g.   Shopping  Choose whole fruits and vegetables instead of processed forms, such as apple juice or applesauce.  Choose a wide variety of high-fiber foods such as avocados, lentils, oats, and kidney beans.  Read the nutrition facts label of the foods you choose. Be aware of foods with added fiber.  These foods often have high sugar and sodium amounts per serving. Cooking  Use whole-grain flour for baking and cooking.  Cook with brown rice instead of white rice. Meal planning  Start the day with a breakfast that is high in fiber, such as a cereal that contains 5 g of fiber or more per serving.  Eat breads and cereals that are made with whole-grain flour instead of refined flour or white flour.  Eat brown rice, bulgur wheat, or millet instead of white rice.  Use beans in place of meat in soups, salads, and pasta dishes.  Be sure that half of the grains you eat each day are whole grains. General information  You can get the recommended daily intake of dietary fiber by: ? Eating a variety of fruits, vegetables, grains, nuts, and beans. ? Taking a fiber supplement if you are not able to take in enough fiber in your diet. It is better to get fiber through food than from a supplement.  Gradually increase how much fiber you consume. If you increase your intake of dietary fiber too quickly, you may have bloating, cramping, or gas.  Drink plenty of water to help you digest fiber.  Choose high-fiber snacks, such as berries, raw vegetables, nuts, and popcorn. What foods should I eat? Fruits Berries. Pears. Apples. Oranges. Avocado. Prunes and raisins. Dried figs. Vegetables Sweet potatoes. Spinach. Kale. Artichokes. Cabbage. Broccoli. Cauliflower. Green peas. Carrots. Squash. Grains Whole-grain breads. Multigrain cereal. Oats and oatmeal. Brown rice. Barley. Bulgur wheat. [place]. Quinoa. Bran muffins. Popcorn. Rye wafer crackers. Meats and other  proteins Navy beans, kidney beans, and pinto beans. Soybeans. Split peas. Lentils. Nuts and seeds. Dairy Fiber-fortified yogurt. Beverages Fiber-fortified soy milk. Fiber-fortified orange juice. Other foods Fiber bars. The items listed above may not be a complete list of recommended foods and beverages. Contact a dietitian for more  information. What foods should I avoid? Fruits Fruit juice. Cooked, strained fruit. Vegetables Fried potatoes. Canned vegetables. Well-cooked vegetables. Grains White bread. Pasta made with refined flour. White rice. Meats and other proteins Fatty cuts of meat. Fried chicken or fried fish. Dairy Milk. Yogurt. Cream cheese. Sour cream. Fats and oils Butters. Beverages Soft drinks. Other foods Cakes and pastries. The items listed above may not be a complete list of foods and beverages to avoid. Talk with your dietitian about what choices are best for you. Summary  Fiber is a type of carbohydrate. It is found in foods such as fruits, vegetables, whole grains, and beans.  A high-fiber diet has many benefits. It can help to prevent constipation, lower blood cholesterol, aid weight loss, and reduce your risk of heart disease, diabetes, and certain cancers.  Increase your intake of fiber gradually. Increasing fiber too quickly may cause cramping, bloating, and gas. Drink plenty of water while you increase the amount of fiber you consume.  The best sources of fiber include whole fruits and vegetables, whole grains, nuts, seeds, and beans. This information is not intended to replace advice given to you by your health care provider. Make sure you discuss any questions you have with your health care provider. Document Revised: 08/18/2019 Document Reviewed: 08/18/2019 Elsevier Patient Education  2021 [employer].

## 2020-09-21 LAB — LIPID PANEL
Cholesterol: 189 mg/dL (ref <200)
HDL: 48 mg/dL — ABNORMAL LOW (ref >=50)
LDL Cholesterol (Calc): 123 mg/dL (calc) — ABNORMAL HIGH
Non-HDL Cholesterol (Calc): 141 mg/dL (calc) — ABNORMAL HIGH (ref <130)
Total CHOL/HDL Ratio: 3.9 (calc) (ref <5.0)
Triglycerides: 84 mg/dL (ref <150)

## 2020-09-21 LAB — CBC WITH DIFFERENTIAL/PLATELET
Absolute Monocytes: 648 cells/uL (ref 200–950)
Basophils Absolute: 58 cells/uL (ref 0–200)
Basophils Relative: 0.8 %
Eosinophils Absolute: 266 cells/uL (ref 15–500)
Eosinophils Relative: 3.7 %
HCT: 43.8 % (ref 35.0–45.0)
Hemoglobin: 14.3 g/dL (ref 11.7–15.5)
Lymphs Abs: 1922 cells/uL (ref 850–3900)
MCH: 29.5 pg (ref 27.0–33.0)
MCHC: 32.6 g/dL (ref 32.0–36.0)
MCV: 90.3 fL (ref 80.0–100.0)
MPV: 9.8 fL (ref 7.5–12.5)
Monocytes Relative: 9 %
Neutro Abs: 4306 cells/uL (ref 1500–7800)
Neutrophils Relative %: 59.8 %
Platelets: 349 10*3/uL (ref 140–400)
RBC: 4.85 10*6/uL (ref 3.80–5.10)
RDW: 13.3 % (ref 11.0–15.0)
Total Lymphocyte: 26.7 %
WBC: 7.2 10*3/uL (ref 3.8–10.8)

## 2020-09-21 LAB — COMPLETE METABOLIC PANEL WITH GFR
AG Ratio: 1.5 (calc) (ref 1.0–2.5)
ALT: 15 U/L (ref 6–29)
AST: 13 U/L (ref 10–35)
Albumin: 4.4 g/dL (ref 3.6–5.1)
Alkaline phosphatase (APISO): 57 U/L (ref 37–153)
BUN: 18 mg/dL (ref 7–25)
CO2: 28 mmol/L (ref 20–32)
Calcium: 10.7 mg/dL — ABNORMAL HIGH (ref 8.6–10.4)
Chloride: 102 mmol/L (ref 98–110)
Creat: 0.6 mg/dL (ref 0.50–0.99)
GFR, Est African American: 115 mL/min/{1.73_m2} (ref >=60)
GFR, Est Non African American: 99 mL/min/{1.73_m2} (ref >=60)
Globulin: 3 g/dL (calc) (ref 1.9–3.7)
Glucose, Bld: 81 mg/dL (ref 65–99)
Potassium: 4.3 mmol/L (ref 3.5–5.3)
Sodium: 139 mmol/L (ref 135–146)
Total Bilirubin: 0.3 mg/dL (ref 0.2–1.2)
Total Protein: 7.4 g/dL (ref 6.1–8.1)

## 2020-09-21 LAB — HEMOGLOBIN A1C
Hgb A1c MFr Bld: 5.4 % of total Hgb (ref <5.7)
Mean Plasma Glucose: 108 mg/dL
eAG (mmol/L): 6 mmol/L

## 2020-09-21 LAB — TSH: TSH: 0.79 mIU/L (ref 0.40–4.50)

## 2020-09-21 LAB — MAGNESIUM: Magnesium: 2.4 mg/dL (ref 1.5–2.5)

## 2020-09-21 LAB — VITAMIN D 25 HYDROXY (VIT D DEFICIENCY, FRACTURES): Vit D, 25-Hydroxy: 108 ng/mL — ABNORMAL HIGH (ref 30–100)

## 2020-10-05 ENCOUNTER — Telehealth: Payer: Self-pay

## 2020-10-05 NOTE — Telephone Encounter (Signed)
Patient states that she has a really bad sore throat that started yesterday. States that she has all her vaccinations so she knows she doesn't have Covid. Requesting something be sent in to the pharmacy.

## 2020-10-08 ENCOUNTER — Encounter: Payer: Self-pay | Admitting: Adult Health

## 2020-10-08 ENCOUNTER — Ambulatory Visit: Payer: 59 | Admitting: Adult Health

## 2020-10-08 ENCOUNTER — Other Ambulatory Visit: Payer: Self-pay

## 2020-10-08 DIAGNOSIS — U071 COVID-19: Secondary | ICD-10-CM | POA: Diagnosis not present

## 2020-10-08 DIAGNOSIS — J029 Acute pharyngitis, unspecified: Secondary | ICD-10-CM | POA: Diagnosis not present

## 2020-10-08 MED ORDER — FLUCONAZOLE 150 MG PO TABS: 150.0000 mg | ORAL_TABLET | Freq: Once | ORAL | 3 refills | Status: DC

## 2020-10-08 MED ORDER — DEXAMETHASONE 1 MG PO TABS: ORAL_TABLET | ORAL | 0 refills | Status: DC

## 2020-10-08 MED ORDER — BENZONATATE 200 MG PO CAPS: ORAL_CAPSULE | ORAL | 1 refills | Status: DC

## 2020-10-08 MED ORDER — PROMETHAZINE-DM 6.25-15 MG/5ML PO SYRP: 5.0000 mL | ORAL_SOLUTION | Freq: Four times a day (QID) | ORAL | 1 refills | Status: DC | PRN

## 2020-10-08 MED ORDER — AZITHROMYCIN 250 MG PO TABS: ORAL_TABLET | ORAL | 1 refills | Status: AC

## 2020-10-08 NOTE — Progress Notes (Signed)
THIS ENCOUNTER IS A VIRTUAL VISIT DUE TO COVID-19 - PATIENT WAS NOT SEEN IN THE OFFICE.  PATIENT HAS CONSENTED TO VIRTUAL VISIT / TELEMEDICINE VISIT   Virtual Visit via telephone Note  I connected with Kelly Goodwin on 10/08/2020 by telephone.  I verified that I am speaking with the correct person using two identifiers.    I discussed the limitations of evaluation and management by telemedicine and the availability of in person appointments. The patient expressed understanding and agreed to proceed.  History of Present Illness:  There were no vitals taken for this visit.  60 y.o. patient with hx of htn, moribd contacted office due to URI sx and + covid 19. OV was converted to telephone visit to minimize exposure in office. This patient has vaccinated for covid 19 + boosted 04/09/2021.   Sx began 4 days ago with sore throat, painful to swallow, worse on the left; very mild dry cough. Denies HA, fever/chills, dypsnea, wheezing.   She has been gargling warm salt water regularly, tried lemon water but burned too much "throat feels raw." Has tried sucking on ice cubes. Denies drooling, choking.   Exposures: husband also ill   Vaccination: 2/2 + booster   Medications  Current Outpatient Medications (Endocrine & Metabolic):    dexamethasone (DECADRON) 1 MG tablet, Take 3 tabs for 3 days, 2 tabs for 3 days 1 tab for 5 days. Take with food.   estradiol-norethindrone (ACTIVELLA) 1-0.5 MG tablet, Take 1 tablet by mouth daily.   levothyroxine (SYNTHROID) 100 MCG tablet, TAKE 1 TABLET DAILY ON AN  EMPTY STOMACH WITH WATER  ONLY FOR 30 MIN AND NO  ANTACIDS CALCIUM  MAGNESIUM FOR 4 HRS. AVOID BIOTIN   OZEMPIC, 0.25 OR 0.5 MG/DOSE, 2 MG/1.5ML SOPN, INJECT 0.5MG  INTO THE SKIN ONCE A WEEK   metFORMIN (GLUCOPHAGE) 1000 MG tablet, TAKE 1 TABLET TWICE DAILY FOR DIABETES (Patient not taking: Reported on 10/08/2020)  Current Outpatient Medications (Cardiovascular):     olmesartan-hydrochlorothiazide (BENICAR HCT) 40-25 MG tablet, TAKE 1 TABLET BY MOUTH  DAILY FOR BLOOD PRESSURE ,  REPLACES LOSARTAN/HCTZ TAB  Current Outpatient Medications (Respiratory):    benzonatate (TESSALON) 200 MG capsule, Take 1 cap three times a day as needed for cough.   fluticasone (FLONASE) 50 MCG/ACT nasal spray, Place 2 sprays into both nostrils at bedtime.   promethazine-dextromethorphan (PROMETHAZINE-DM) 6.25-15 MG/5ML syrup, Take 5 mLs by mouth 4 (four) times daily as needed for cough.  Current Outpatient Medications (Analgesics):    ibuprofen (ADVIL) 800 MG tablet, TAKE 1 TABLET BY MOUTH EVERY 8 HOURS WITH FOOD AS NEEDED FOR PAIN AND INFLAMMATION   Rimegepant Sulfate (NURTEC) 75 MG TBDP, Take 1 tab under tongue once daily as needed with onset of migraine.   Aspirin (STANBACK HEADACHE POWDER PO), Take by mouth. (Patient not taking: Reported on 10/08/2020)   Current Outpatient Medications (Other):    azithromycin (ZITHROMAX) 250 MG tablet, Take 2 tablets (500 mg) on  Day 1,  followed by 1 tablet (250 mg) once daily on Days 2 through 5.   CALCIUM-VITAMIN D PO, Take 1 tablet by mouth daily.   cyclobenzaprine (FLEXERIL) 10 MG tablet, Take 1 tablet (10 mg total) by mouth 3 (three) times daily as needed for muscle spasms.   famotidine (PEPCID) 40 MG tablet, Take 1 tablet (40 mg total) by mouth every evening.   fluconazole (DIFLUCAN) 150 MG tablet, Take 1 tablet (150 mg total) by mouth once for 1 dose.   gabapentin (NEURONTIN) 300  MG capsule, Take 1 capsule (300 mg total) by mouth 3 (three) times daily.   GARLIC PO, Take 1 tablet by mouth 2 (two) times daily.   lisdexamfetamine (VYVANSE) 60 MG capsule, Take 1 capsule (60 mg total) by mouth every morning.   LYSINE PO, Take 1 tablet by mouth at bedtime.   Multiple Vitamins-Minerals (MULTIVITAMIN WITH MINERALS) tablet, Take 1 tablet by mouth daily.   temazepam (RESTORIL) 30 MG capsule, TAKE 1 CAPSULE BY MOUTH EVERY NIGHT AT BEDTIME AS  NEEDED FOR INSOMNIA   valACYclovir (VALTREX) 500 MG tablet, TAKE 1 TABLET BY MOUTH  TWICE DAILY AS NEEDED FOR  COLD SORES / FEVER BLISTERS  Allergies: No Known Allergies  Problem list She has Diverticulosis; Hypothyroid; Irritable bowel syndrome (IBS); PCOS (polycystic ovarian syndrome); Migraine; Arthritis; Abnormal glucose (prediabetes); Essential hypertension; Vitamin D deficiency; Medication management; Morbid obesity ([place]); Endometriosis; Fatty liver; Aortic atherosclerosis ([place]); and COVID-19 (10/05/2020) on their problem list.   Social History:   reports that she has never smoked. She has never used smokeless tobacco. She reports current alcohol use. She reports that she does not use drugs.   Observations/Objective:  General : Fairly well sounding patient in no apparent distress HEENT: Mild hoarseness, no cough for duration of visit Lungs: speaks in complete sentences, no audible wheezing, no apparent distress Neurological: alert, oriented x 3 Psychiatric: pleasant, judgement appropriate   Assessment and Plan:  COVID-19  Covid 19 positive per rapid screening test in vaccinated Currently symptoms are mild  Risk factors include htn, morbid obesity Discussed antivirals but patient declines  Regular breathing exercises, proning EC bASA daily for clot prevention unless contraindicated, regular walking/calf exercises Take tylenol PRN temp 101+ Push hydration Sx supportive therapy Steroid taper was offered  Will also give zpak due to unable to r/o bacterial pharyngitis  Try tessalon for cough and throat Continue salt water gargles, try sugar free throat lozenges Immune support with vitamin C, zinc, vitamin D reviewed Follow up via mychart or telephone if needed Advised patient obtain O2 monitor; present to ED if persistently <88% or with severe dyspnea, any CP, fever uncontrolled by tylenol, confusion, sudden decline Should remain in isolation until at least 5 days from onset of  sx, 24-48 hours fever free without tylenol, sx such as cough are improved.  -     dexamethasone (DECADRON) 1 MG tablet; Take 3 tabs for 3 days, 2 tabs for 3 days 1 tab for 5 days. Take with food. -     azithromycin (ZITHROMAX) 250 MG tablet; Take 2 tablets (500 mg) on  Day 1,  followed by 1 tablet (250 mg) once daily on Days 2 through 5. -     benzonatate (TESSALON) 200 MG capsule; Take 1 cap three times a day as needed for cough. -     promethazine-dextromethorphan (PROMETHAZINE-DM) 6.25-15 MG/5ML syrup; Take 5 mLs by mouth 4 (four) times daily as needed for cough. -     fluconazole (DIFLUCAN) 150 MG tablet; Take 1 tablet (150 mg total) by mouth once for 1 dose.   Follow Up Instructions:  I discussed the assessment and treatment plan with the patient. The patient was provided an opportunity to ask questions and all were answered. The patient agreed with the plan and demonstrated an understanding of the instructions.   The patient was advised to call back or seek an in-person evaluation if the symptoms worsen or if the condition fails to improve as anticipated.  I provided 20 minutes of non-face-to-face time  during this encounter.   Izora Ribas, NP

## 2020-10-24 ENCOUNTER — Other Ambulatory Visit: Payer: Self-pay | Admitting: *Deleted

## 2020-10-24 MED ORDER — ESTRADIOL-NORETHINDRONE ACET 1-0.5 MG PO TABS: 1.0000 | ORAL_TABLET | Freq: Every day | ORAL | 1 refills | Status: DC

## 2020-10-29 ENCOUNTER — Other Ambulatory Visit: Payer: Self-pay | Admitting: Internal Medicine

## 2020-10-29 DIAGNOSIS — R519 Headache, unspecified: Secondary | ICD-10-CM

## 2020-10-29 MED ORDER — IBUPROFEN 800 MG PO TABS: ORAL_TABLET | ORAL | 0 refills | Status: DC

## 2020-11-01 ENCOUNTER — Other Ambulatory Visit: Payer: Self-pay | Admitting: Adult Health Nurse Practitioner

## 2020-11-13 ENCOUNTER — Other Ambulatory Visit: Payer: Self-pay

## 2020-11-13 DIAGNOSIS — E1169 Type 2 diabetes mellitus with other specified complication: Secondary | ICD-10-CM

## 2020-11-13 DIAGNOSIS — E785 Hyperlipidemia, unspecified: Secondary | ICD-10-CM

## 2020-11-13 MED ORDER — OZEMPIC (1 MG/DOSE) 4 MG/3ML ~~LOC~~ SOPN: 1.0000 mg | PEN_INJECTOR | SUBCUTANEOUS | 1 refills | Status: DC

## 2020-12-03 ENCOUNTER — Other Ambulatory Visit: Payer: Self-pay | Admitting: Adult Health

## 2020-12-07 ENCOUNTER — Other Ambulatory Visit: Payer: Self-pay

## 2020-12-07 MED ORDER — LEVOTHYROXINE SODIUM 100 MCG PO TABS: ORAL_TABLET | ORAL | 3 refills | Status: DC

## 2020-12-14 ENCOUNTER — Other Ambulatory Visit: Payer: Self-pay | Admitting: Internal Medicine

## 2020-12-14 DIAGNOSIS — R519 Headache, unspecified: Secondary | ICD-10-CM

## 2020-12-26 ENCOUNTER — Other Ambulatory Visit: Payer: Self-pay

## 2020-12-26 ENCOUNTER — Ambulatory Visit (INDEPENDENT_AMBULATORY_CARE_PROVIDER_SITE_OTHER): Payer: 59 | Admitting: Adult Health

## 2020-12-26 ENCOUNTER — Encounter: Payer: Self-pay | Admitting: Adult Health

## 2020-12-26 VITALS — BP 122/74 | HR 93 | Temp 96.6°F | Wt 176.0 lb

## 2020-12-26 DIAGNOSIS — E785 Hyperlipidemia, unspecified: Secondary | ICD-10-CM | POA: Insufficient documentation

## 2020-12-26 DIAGNOSIS — I10 Essential (primary) hypertension: Secondary | ICD-10-CM

## 2020-12-26 DIAGNOSIS — E669 Obesity, unspecified: Secondary | ICD-10-CM | POA: Diagnosis not present

## 2020-12-26 DIAGNOSIS — Z79899 Other long term (current) drug therapy: Secondary | ICD-10-CM

## 2020-12-26 DIAGNOSIS — E039 Hypothyroidism, unspecified: Secondary | ICD-10-CM

## 2020-12-26 DIAGNOSIS — R7309 Other abnormal glucose: Secondary | ICD-10-CM | POA: Diagnosis not present

## 2020-12-26 DIAGNOSIS — I700 Atherosclerosis of aorta: Secondary | ICD-10-CM | POA: Diagnosis not present

## 2020-12-26 DIAGNOSIS — E782 Mixed hyperlipidemia: Secondary | ICD-10-CM

## 2020-12-26 DIAGNOSIS — R7303 Prediabetes: Secondary | ICD-10-CM

## 2020-12-26 DIAGNOSIS — E559 Vitamin D deficiency, unspecified: Secondary | ICD-10-CM

## 2020-12-26 DIAGNOSIS — G43809 Other migraine, not intractable, without status migrainosus: Secondary | ICD-10-CM

## 2020-12-26 MED ORDER — LACTULOSE 10 GM/15ML PO SOLN: 20.0000 g | Freq: Two times a day (BID) | ORAL | 0 refills | Status: DC

## 2020-12-26 MED ORDER — LUBIPROSTONE 8 MCG PO CAPS: 8.0000 ug | ORAL_CAPSULE | Freq: Two times a day (BID) | ORAL | 0 refills | Status: DC

## 2020-12-26 MED ORDER — METFORMIN HCL 1000 MG PO TABS: ORAL_TABLET | ORAL | 3 refills | Status: DC

## 2020-12-26 NOTE — Patient Instructions (Signed)
Try picking up voltaren (diclofenac) gel at the pharmacy - apply to heel/foot 2-4 times a day and wear a sock on top  Please keep a food log in note book or take photo of all your meals/snacks and bring with you next visit  Please bring printed menus of all the restaurants you typically go to so we can help you make the best food choices   Lactulose oral solution What is this medication? LACTULOSE (LAK tyoo lose) is a laxative derived from lactose. It helps to treat chronic constipation and to treat or prevent hepatic encephalopathy or coma. These are brain disorders that result from liver disease. This medicine may be used for other purposes; ask your health care provider or pharmacist if you have questions. COMMON BRAND NAME(S): Acilac, Cephulac, Cholac, Chronulac, Constilac, Constulose, Enulose, Generlac What should I tell my care team before I take this medication? They need to know if you have any of these conditions: need a galactose-free diet scheduled for surgery an unusual or allergic reaction to lactulose, other sugars, medicines, foods, dyes or preservatives pregnant or trying to get pregnant breast-feeding How should I use this medication? Take this medicine mouth. Follow the directions on the prescription label. Shake well before using. Use a specially marked spoon or container to measure your medicine. Ask your pharmacist if you do not have one. Household spoons are not accurate. Take your doses at regular intervals. Do not take your medicine more often than directed. You may be directed to take this medicine rectally. If so, you must follow specific directions from your doctor or healthcare professional. Please contact him or her. Talk to your pediatrician regarding the use of this medicine in children. Special care may be needed. Overdosage: If you think you have taken too much of this medicine contact a poison control center or emergency room at once. NOTE: This medicine is  only for you. Do not share this medicine with others. What if I miss a dose? If you miss a dose, take it as soon as you can. If it is almost time for your next dose, take only that dose. Do not take double or extra doses. What may interact with this medication? antacids neomycin other laxatives This list may not describe all possible interactions. Give your health care provider a list of all the medicines, herbs, non-prescription drugs, or dietary supplements you use. Also tell them if you smoke, drink alcohol, or use illegal drugs. Some items may interact with your medicine. What should I watch for while using this medication? This medicine may not produce any result for 24 to 48 hours. Do not take this medicine for longer than directed by your doctor or health care professional. Drink plenty of water with each dose of this medicine. What side effects may I notice from receiving this medication? Side effects that you should report to your doctor or health care professional as soon as possible: diarrhea Side effects that usually do not require medical attention (report to your doctor or health care professional if they continue or are bothersome): belching, flatulence nausea or vomiting stomach pain or discomfort This list may not describe all possible side effects. Call your doctor for medical advice about side effects. You may report side effects to FDA at 1-800-FDA-1088. Where should I keep my medication? Keep out of the reach of children. This medicine may darken in color under normal storage conditions. This is because of the sugar solution and does not affect the way the medicine  works. If the solution becomes extremely dark in color, contact your health care professional before use. Store at room temperature between 15 and 30 degrees C (59 and 86 degrees F). Do not freeze. Keep container tightly closed. Throw away any unused medicine after the expiration date. NOTE: This sheet is a  summary. It may not cover all possible information. If you have questions about this medicine, talk to your doctor, pharmacist, or health care provider.  2022 Elsevier/Gold Standard (2007-10-19 16:04:57)    Lubiprostone Oral Capsule What is this medication? LUBIPROSTONE (loo bi PROS tone) is a laxative. It is used to treat chronic constipation and constipation caused by opioids (certain prescription pain medicines). It is also used to treat adult women with irritable bowel syndrome who have constipation. This medicine may be used for other purposes; ask your health care provider or pharmacist if you have questions. COMMON BRAND NAME(S): Amitiza What should I tell my care team before I take this medication? They need to know if you have any of these conditions: history of blockage in your bowels history of stool (fecal) impaction liver disease stomach cancer an unusual or allergic reaction to lubiprostone, other medicines, foods, dyes, or preservatives pregnant or trying to get pregnant breast-feeding How should I use this medication? Take this medicine by mouth with water. Take it as directed on the prescription label at the same time every day. Do not cut, crush or chew this medicine. Swallow the capsules whole. Take it with food. Keep taking it unless your health care provider tells you to stop. Talk to your health care provider about the use of this medicine in children. It is not approved for use in children. Overdosage: If you think you have taken too much of this medicine contact a poison control center or emergency room at once. NOTE: This medicine is only for you. Do not share this medicine with others. What if I miss a dose? If you miss a dose, take it as soon as you can. If it is almost time for your next dose, take only that dose. Do not take double or extra doses. What may interact with this medication? medicines that treat diarrhea methadone other medicines for  constipation This list may not describe all possible interactions. Give your health care provider a list of all the medicines, herbs, non-prescription drugs, or dietary supplements you use. Also tell them if you smoke, drink alcohol, or use illegal drugs. Some items may interact with your medicine. What should I watch for while using this medication? Visit your health care provider for regular checks on your progress. Tell your health care provider if your symptoms do not start to get better or if they get worse. What side effects may I notice from receiving this medication? Side effects that you should report to your doctor or health care professional as soon as possible: allergic reactions (skin rash, itching or hives; swelling of the face, lips, or tongue) feeling faint or lightheaded, falls new or worsening stomach pain severe or prolonged diarrhea vomiting Side effects that usually do not require medical attention (report to your doctor or health care professional if they continue or are bothersome): headache loose stools nausea This list may not describe all possible side effects. Call your doctor for medical advice about side effects. You may report side effects to FDA at 1-800-FDA-1088. Where should I keep my medication? Keep out of the reach of children and pets. Store at [employer] C (77 degrees F). Protect from  light. Avoid exposure to extreme heat/cold. Throw away any unused medicine after the expiration date. NOTE: This sheet is a summary. It may not cover all possible information. If you have questions about this medicine, talk to your doctor, pharmacist, or health care provider.  2022 Elsevier/Gold Standard (2019-05-04 12:55:54)

## 2020-12-26 NOTE — Progress Notes (Signed)
3 MONTH FOLLOW UP   Assessment and Plan:   Aortic atherosclerosis ([place]) Per CT Abdomen 09/2019 Control blood pressure, cholesterol, glucose, increase exercise.   Essential hypertension - continue medications, DASH diet, exercise and monitor at home. Call if greater than 130/80.  -     CBC with Diff -     COMPLETE METABOLIC PANEL WITH GFR  Mixed hyperlipidemia decrease fatty foods (saturated fat) increase fiber increase activity. -     Lipid Profile  PCOS/other abnormal glucose (prediabetes) Discussed disease and risks Discussed diet/exercise, weight management  Check A1C q9m defer this visit - patient has lost weight, now in normal range, will continue to monitor  Hypothyroidism, unspecified type Continue current medications Reminder to take on an empty stomach 30-670ms before first meal of the day. No antacid medications for 4 hours. -     TSH  Obesity - BMI 33 with comorbidities Has + binge eating; vyvanse Has tried and failed phentermine, contrave, qysema, belviq, can not afford saxenda, topamax had AE's.  Improved with vyvanse and ozempic; hold due to constipation, cautiously restart if more regular movements Keep food log Print and bring menu of restaurants she frequents next visit and will review best options together  1 month follow up  Medication management -     Magnesium  Constipation Going on day 6 without BM Abdominal exam with vague discomfort -  Will give lactulose 30 mg BID with plenty of water Given script for amitiza 8 mg BID with coupon to try for IBS after failing linzess Has follow up and colonscopy planned with est GI later this year Please go to the ER if you have any severe AB pain, unable to hold down food/water, blood in stool or vomit, chest pain, shortness of breath, or any worsening symptoms.   Discussed med's effects and SE's.  Further disposition pending results if labs check today. Discussed med's effects and SE's.   Over 30 minutes  of face to face interview, exam, counseling, chart review, and critical decision making was performed.   Future Appointments  Date Time Provider DeBertram1/16/2023  2:00 PM MuMagda BernheimNP [facility] None      HPI  [age].o. female  presents for 6 month follow up on htn, hyperlipidemia, PCOS/anormal glucose, obesity, vitamin D.    She has IBS C/D, hx of diverticulosis, prone to constipation, hasn't moved since Friday, distended abdomen, no nausea/vomiting but eating less, has tried prunes, mag citrate (no movement), castor oil, OTC stool softener. Has passed some liquid stool but no full evacuation. Vague left lower quad tenderness intermittent, denies blood, fever/chills. She reports taking daily fiber supplant. Had liquid stool with miralax, benefit with linzess but with severe hair loss/breakage, had to stop. [place]  BMI is Body mass index is 33.25 kg/m., she has been working on diet, trying to make better choices but does eat out nearly 100% of meals, admits limited exercise. Limited benefit with phentermine, belvia, qysima, topiramate (SE), contrave, cost barrier with saxenda. Some benefit with vyvanse for binge eating behaviors but not currently taking. Now on ozempic, has noted appetite benefit with 1 mg/week, she is down 4 lb. Does have more constipation with ozempic/off of metformin.  Wt Readings from Last 3 Encounters:  12/26/20 176 lb (79.8 kg)  09/20/20 180 lb (81.6 kg)  07/26/20 183 lb (83 kg)   Her blood pressure has been controlled at home, today their BP is BP: 122/74. She does not workout.  She denies chest pain, shortness of breath, dizziness.    She is not on cholesterol medication and denies myalgias. Her cholesterol is at goal. The cholesterol last visit was:  Lab Results  Component Value Date   CHOL 189 09/20/2020   HDL 48 (L) 09/20/2020   LDLCALC 123 (H) 09/20/2020   TRIG 84 09/20/2020   CHOLHDL 3.9 09/20/2020  . She has been working on  diet and exercise for PCOS, was on metoformin 2000 mg since her 20's, recently stopped and on ozempic for weight loss. she is on bASA, she is on ACE/ARB and denies foot ulcerations, hyperglycemia, hypoglycemia , increased appetite, nausea, paresthesia of the feet, polydipsia, polyuria, visual disturbances, vomiting and weight loss. Last A1C in the office was:  Lab Results  Component Value Date   HGBA1C 5.4 09/20/2020   Patient is on Vitamin D supplement, reports was taking high dose supplement previous, now taking calcium+ D3 supplement only, ? 600 IU Lab Results  Component Value Date   VD25OH 108 (H) 09/20/2020     She is on thyroid medication. Her medication was not changed last visit.  She tried armour thyroid but states she was too tired. She is alternating 100 mcg and 50 mcg every other day, hasn't changed dose recently.  Lab Results  Component Value Date   TSH 0.79 09/20/2020     Current Medications:   Current Outpatient Medications (Endocrine & Metabolic):    estradiol-norethindrone (ACTIVELLA) 1-0.5 MG tablet, Take 1 tablet by mouth daily.   levothyroxine (SYNTHROID) 100 MCG tablet, TAKE 1 TABLET DAILY ON AN EMPTY STOMACH WITH WATER ONLY FOR 30 MIN AND NO ANTACIDS, CALCIUM, MAGNESIUM FOR 4 HRS. AVOID BIOTIN.   Semaglutide, 1 MG/DOSE, (OZEMPIC, 1 MG/DOSE,) 4 MG/3ML SOPN, Inject 1 mg into the skin once a week.   dexamethasone (DECADRON) 1 MG tablet, Take 3 tabs for 3 days, 2 tabs for 3 days 1 tab for 5 days. Take with food.   metFORMIN (GLUCOPHAGE) 1000 MG tablet, TAKE 1 TABLET TWICE DAILY FOR DIABETES (Patient not taking: No sig reported)  Current Outpatient Medications (Cardiovascular):    olmesartan-hydrochlorothiazide (BENICAR HCT) 40-25 MG tablet, TAKE 1 TABLET BY MOUTH  DAILY FOR BLOOD PRESSURE ,  REPLACES LOSARTAN/HCTZ TAB  Current Outpatient Medications (Respiratory):    benzonatate (TESSALON) 200 MG capsule, Take 1 cap three times a day as needed for cough. (Patient not  taking: Reported on 12/26/2020)   fluticasone (FLONASE) 50 MCG/ACT nasal spray, Place 2 sprays into both nostrils at bedtime. (Patient not taking: Reported on 12/26/2020)   promethazine-dextromethorphan (PROMETHAZINE-DM) 6.25-15 MG/5ML syrup, Take 5 mLs by mouth 4 (four) times daily as needed for cough. (Patient not taking: Reported on 12/26/2020)  Current Outpatient Medications (Analgesics):    ibuprofen (ADVIL) 800 MG tablet, TAKE 1/2 TO 1 TABLET BY MOUTH 3 TIMES DAILY (EVERY 8 HOURS) WITH FOOD AS NEEDED FOR PAIN AND INFLAMMATION. TRY TO LIMIT TO 1/2 TABLET AND FREQUENCY TO AVOID PERMANENT KIDNEY DAMAGE   Aspirin (STANBACK HEADACHE POWDER PO), Take by mouth. (Patient not taking: No sig reported)   Rimegepant Sulfate (NURTEC) 75 MG TBDP, Take 1 tab under tongue once daily as needed with onset of migraine. (Patient not taking: Reported on 12/26/2020)   Current Outpatient Medications (Other):    famotidine (PEPCID) 40 MG tablet, Take 1 tablet (40 mg total) by mouth every evening.   GARLIC PO, Take 1 tablet by mouth 2 (two) times daily.   lactulose (CHRONULAC) 10 GM/15ML solution, Take 30  mLs (20 g total) by mouth 2 (two) times daily.   lubiprostone (AMITIZA) 8 MCG capsule, Take 1 capsule (8 mcg total) by mouth 2 (two) times daily with a meal.   LYSINE PO, Take 1 tablet by mouth at bedtime.   Multiple Vitamins-Minerals (MULTIVITAMIN WITH MINERALS) tablet, Take 1 tablet by mouth daily.   temazepam (RESTORIL) 30 MG capsule, TAKE 1 CAPSULE BY MOUTH EVERY NIGHT AT BEDTIME AS NEEDED FOR INSOMNIA   valACYclovir (VALTREX) 500 MG tablet, TAKE 1 TABLET BY MOUTH  TWICE DAILY AS NEEDED FOR  COLD SORES / FEVER BLISTERS   CALCIUM-VITAMIN D PO, Take 1 tablet by mouth daily. (Patient not taking: Reported on 12/26/2020)   cyclobenzaprine (FLEXERIL) 10 MG tablet, Take 1 tablet (10 mg total) by mouth 3 (three) times daily as needed for muscle spasms. (Patient not taking: Reported on 12/26/2020)   gabapentin (NEURONTIN)  300 MG capsule, Take 1 capsule (300 mg total) by mouth 3 (three) times daily. (Patient not taking: Reported on 12/26/2020)   lisdexamfetamine (VYVANSE) 60 MG capsule, Take 1 capsule (60 mg total) by mouth every morning. (Patient not taking: Reported on 12/26/2020)  Medical History:  Past Medical History:  Diagnosis Date   Arthritis    Constipation, chronic    Diverticulitis    Diverticulosis    Fibroid uterus    History of gallstones    Hyperlipidemia    Hypertension    Hypothyroidism    Migraine    PCOS (polycystic ovarian syndrome)    takes metformin for this   Prediabetes    Thyroid disease    Allergies No Known Allergies  SURGICAL HISTORY She  has a past surgical history that includes Breast reduction surgery (1988); Cholecystectomy (1995); Tonsillectomy and adenoidectomy (1990's); LASIK (Left); Refractive surgery (Left); Breast surgery; Eye surgery; and Reduction mammaplasty. FAMILY HISTORY Her family history includes COPD in her mother; Diabetes in her mother and sister; Diverticulitis in her mother; Heart disease in her mother; Leukemia in her mother; Lung cancer in her mother. SOCIAL HISTORY She  reports that she has never smoked. She has never used smokeless tobacco. She reports current alcohol use. She reports that she does not use drugs.   Review of Systems: Review of Systems  Constitutional:  Negative for malaise/fatigue and weight loss.  HENT:  Negative for congestion, hearing loss, sore throat and tinnitus.        Hoarseness  Eyes:  Negative for blurred vision and double vision.  Respiratory:  Negative for cough, shortness of breath and wheezing.   Cardiovascular:  Negative for chest pain, palpitations, orthopnea, claudication and leg swelling.  Gastrointestinal:  Positive for abdominal pain and constipation. Negative for blood in stool, diarrhea, heartburn, melena, nausea and vomiting.  Genitourinary: Negative.   Musculoskeletal:  Negative for joint pain and  myalgias.  Skin:  Negative for rash.  Neurological:  Positive for headaches (freqent headache and migraine). Negative for dizziness, tingling, sensory change and weakness.  Endo/Heme/Allergies:  Negative for polydipsia.  Psychiatric/Behavioral: Negative.  Negative for depression and substance abuse. The patient is not nervous/anxious.   All other systems reviewed and are negative.  Physical Exam: Estimated body mass index is 33.25 kg/m as calculated from the following:   Height as of 05/09/20: '5\' 1"'$  (1.549 m).   Weight as of this encounter: 176 lb (79.8 kg). BP 122/74   Pulse 93   Temp (!) 96.6 F (35.9 C)   Wt 176 lb (79.8 kg)   SpO2 99%   BMI  33.25 kg/m   General Appearance: Well nourished well developed, in no apparent distress.  Eyes: PERRLA, EOMs, conjunctiva no swelling or erythema ENT/Mouth: Ear canals normal without obstruction, swelling, erythema, or discharge.  TMs normal bilaterally with no erythema, bulging, retraction, or loss of landmark.  Oropharynx moist and clear with no exudate, erythema, or swelling.   Neck: Supple, thyroid nodule left side. No bruits.  No cervical adenopathy Respiratory: Respiratory effort normal, Breath sounds clear A&P without wheeze, rhonchi, rales.   Cardio: RRR without murmurs, rubs or gallops. Brisk peripheral pulses without edema.  Chest: symmetric, with normal excursions Abdomen: Soft, obese, non-distended, vague discomfort with LLQ palpation, no guarding, rebound, hernias, palpable masses, or organomegaly.  Lymphatics: Non tender without lymphadenopathy.  Musculoskeletal: Full ROM all peripheral extremities,5/5 strength, and normal gait. Skin: Warm, dry without rashes, lesions, ecchymosis. Neuro: Awake and oriented X 3, Cranial nerves intact, reflexes equal bilaterally. Normal muscle tone, no cerebellar symptoms. Sensation intact.  Psych:  normal affect, Insight and Judgment appropriate.   Izora Ribas, NP 5:17 PM [facility]

## 2020-12-27 ENCOUNTER — Other Ambulatory Visit: Payer: Self-pay | Admitting: Adult Health

## 2020-12-27 ENCOUNTER — Telehealth: Payer: Self-pay

## 2020-12-27 LAB — COMPLETE METABOLIC PANEL WITH GFR
AG Ratio: 1.5 (calc) (ref 1.0–2.5)
ALT: 10 U/L (ref 6–29)
AST: 11 U/L (ref 10–35)
Albumin: 4.3 g/dL (ref 3.6–5.1)
Alkaline phosphatase (APISO): 58 U/L (ref 37–153)
BUN: 8 mg/dL (ref 7–25)
CO2: 30 mmol/L (ref 20–32)
Calcium: 10.1 mg/dL (ref 8.6–10.4)
Chloride: 102 mmol/L (ref 98–110)
Creat: 0.69 mg/dL (ref 0.50–1.05)
Globulin: 2.9 g/dL (calc) (ref 1.9–3.7)
Glucose, Bld: 75 mg/dL (ref 65–99)
Potassium: 4.3 mmol/L (ref 3.5–5.3)
Sodium: 140 mmol/L (ref 135–146)
Total Bilirubin: 0.4 mg/dL (ref 0.2–1.2)
Total Protein: 7.2 g/dL (ref 6.1–8.1)
eGFR: 99 mL/min/{1.73_m2} (ref >=60)

## 2020-12-27 LAB — CBC WITH DIFFERENTIAL/PLATELET
Absolute Monocytes: 626 cells/uL (ref 200–950)
Basophils Absolute: 48 cells/uL (ref 0–200)
Basophils Relative: 0.7 %
Eosinophils Absolute: 156 cells/uL (ref 15–500)
Eosinophils Relative: 2.3 %
HCT: 43.5 % (ref 35.0–45.0)
Hemoglobin: 13.9 g/dL (ref 11.7–15.5)
Lymphs Abs: 1788 cells/uL (ref 850–3900)
MCH: 29.5 pg (ref 27.0–33.0)
MCHC: 32 g/dL (ref 32.0–36.0)
MCV: 92.4 fL (ref 80.0–100.0)
MPV: 9.7 fL (ref 7.5–12.5)
Monocytes Relative: 9.2 %
Neutro Abs: 4182 cells/uL (ref 1500–7800)
Neutrophils Relative %: 61.5 %
Platelets: 361 10*3/uL (ref 140–400)
RBC: 4.71 10*6/uL (ref 3.80–5.10)
RDW: 12.2 % (ref 11.0–15.0)
Total Lymphocyte: 26.3 %
WBC: 6.8 10*3/uL (ref 3.8–10.8)

## 2020-12-27 LAB — LIPID PANEL
Cholesterol: 194 mg/dL (ref <200)
HDL: 44 mg/dL — ABNORMAL LOW (ref >=50)
LDL Cholesterol (Calc): 129 mg/dL (calc) — ABNORMAL HIGH
Non-HDL Cholesterol (Calc): 150 mg/dL (calc) — ABNORMAL HIGH (ref <130)
Total CHOL/HDL Ratio: 4.4 (calc) (ref <5.0)
Triglycerides: 106 mg/dL (ref <150)

## 2020-12-27 LAB — MAGNESIUM: Magnesium: 2.1 mg/dL (ref 1.5–2.5)

## 2020-12-27 LAB — TSH: TSH: 0.96 mIU/L (ref 0.40–4.50)

## 2020-12-27 MED ORDER — PREDNISONE 20 MG PO TABS: ORAL_TABLET | ORAL | 0 refills | Status: DC

## 2020-12-27 NOTE — Telephone Encounter (Signed)
Patient states that the medicine from yesterday worked. Her hoarseness has gotten worse and she has a sore throat and her ears hurt. Requesting something be sent into the pharmacy.

## 2021-01-06 ENCOUNTER — Other Ambulatory Visit: Payer: Self-pay | Admitting: Nurse Practitioner

## 2021-01-07 ENCOUNTER — Other Ambulatory Visit: Payer: Self-pay

## 2021-01-07 MED ORDER — VALACYCLOVIR HCL 500 MG PO TABS: ORAL_TABLET | ORAL | 3 refills | Status: AC

## 2021-01-28 ENCOUNTER — Telehealth: Payer: Self-pay | Admitting: Physician Assistant

## 2021-01-28 NOTE — Telephone Encounter (Signed)
Pt called requesting to see Amy. Pt complains of bloating, burning, and swelling in abdomen. Wants to be seen sooner rather than later as she fears she may have ulcers. Pt is scheduled to see Dr. Tarri Glenn on 10/24, but asked to be seen sooner if possible.

## 2021-01-28 NOTE — Telephone Encounter (Signed)
Spoke with this very kind patient. She is complaining of bloating, indigestion, and loose stools.  She is taking Miralax for her bowel movements. Stopped other medications for her constipation. She reports she has a discomfort with the bloating that "is at the base of my breast bone and into my back at my bra line."  Belching and burping constantly. Eating triggers it or it occurs with drinking water. Sometimes she eats and has to go to the bathroom urgently. She will have a diarrhea stool and feels better temporarily.  She has tried a plethora of OTC and herbal remedies without improvement. Mylanta, Gas-X, Collegen, IBguard and some others. These give temporary relief at the best. She is not on any H2 blockers or PPI.  She is avoiding spicy, greasy and rich foods. She is avoiding OTC pain meds including the Goody powders she has taken for years. Her symptoms have been worsening over the past 2 weeks.

## 2021-01-29 ENCOUNTER — Other Ambulatory Visit: Payer: Self-pay

## 2021-01-29 MED ORDER — FAMOTIDINE 20 MG PO TABS: 20.0000 mg | ORAL_TABLET | Freq: Two times a day (BID) | ORAL | 0 refills | Status: DC

## 2021-01-29 MED ORDER — DICYCLOMINE HCL 10 MG PO CAPS: ORAL_CAPSULE | ORAL | 0 refills | Status: DC

## 2021-01-29 NOTE — Telephone Encounter (Signed)
Discussed the plan with the patient. She agrees to this. She will stop her supplements, continue her prescribed medications and start the Pepcid and Bentyl. She can use PRN Mylanta, Gas-X or IBguard.

## 2021-02-04 ENCOUNTER — Other Ambulatory Visit: Payer: Self-pay | Admitting: Nurse Practitioner

## 2021-02-04 NOTE — Progress Notes (Deleted)
Assessment and Plan:  There are no diagnoses linked to this encounter.    Further disposition pending results of labs. Discussed med's effects and SE's.   Over 30 minutes of exam, counseling, chart review, and critical decision making was performed.   Future Appointments  Date Time Provider [place]  02/05/2021  2:30 PM Magda Bernheim, NP [facility] None  02/18/2021  9:50 AM Thornton Park, MD [facility]  05/13/2021  2:00 PM Magda Bernheim, NP [facility] None    ------------------------------------------------------------------------------------------------------------------   HPI There were no vitals taken for this visit. 60 y.o.female presents for  Past Medical History:  Diagnosis Date   Arthritis    Constipation, chronic    Diverticulitis    Diverticulosis    Fibroid uterus    History of gallstones    Hyperlipidemia    Hypertension    Hypothyroidism    Migraine    PCOS (polycystic ovarian syndrome)    takes metformin for this   Prediabetes    Thyroid disease      No Known Allergies  Current Outpatient Medications on File Prior to Visit  Medication Sig   CALCIUM-VITAMIN D PO Take 1 tablet by mouth daily. (Patient not taking: Reported on 12/26/2020)   dicyclomine (BENTYL) 10 MG capsule 1 capsule TID PRN abd pain/cramping/diarrhea   estradiol-norethindrone (ACTIVELLA) 1-0.5 MG tablet Take 1 tablet by mouth daily.   famotidine (PEPCID) 20 MG tablet Take 1 tablet (20 mg total) by mouth 2 (two) times daily before a meal.   fluticasone (FLONASE) 50 MCG/ACT nasal spray Place 2 sprays into both nostrils at bedtime. (Patient not taking: Reported on 12/26/2020)   gabapentin (NEURONTIN) 300 MG capsule Take 1 capsule (300 mg total) by mouth 3 (three) times daily. (Patient not taking: Reported on 7/90/2409)   GARLIC PO Take 1 tablet by mouth 2 (two) times daily.   ibuprofen (ADVIL) 800 MG tablet TAKE 1/2 TO 1 TABLET BY MOUTH 3 TIMES DAILY (EVERY 8 HOURS) WITH  FOOD AS NEEDED FOR PAIN AND INFLAMMATION. TRY TO LIMIT TO 1/2 TABLET AND FREQUENCY TO AVOID PERMANENT KIDNEY DAMAGE   lactulose (CHRONULAC) 10 GM/15ML solution Take 30 mLs (20 g total) by mouth 2 (two) times daily.   levothyroxine (SYNTHROID) 100 MCG tablet TAKE 1 TABLET DAILY ON AN EMPTY STOMACH WITH WATER ONLY FOR 30 MIN AND NO ANTACIDS, CALCIUM, MAGNESIUM FOR 4 HRS. AVOID BIOTIN.   lisdexamfetamine (VYVANSE) 60 MG capsule Take 1 capsule (60 mg total) by mouth every morning. (Patient not taking: Reported on 12/26/2020)   lubiprostone (AMITIZA) 8 MCG capsule Take 1 capsule (8 mcg total) by mouth 2 (two) times daily with a meal.   LYSINE PO Take 1 tablet by mouth at bedtime.   metFORMIN (GLUCOPHAGE) 1000 MG tablet TAKE 1 TABLET TWICE DAILY FOR DIABETES   Multiple Vitamins-Minerals (MULTIVITAMIN WITH MINERALS) tablet Take 1 tablet by mouth daily.   olmesartan-hydrochlorothiazide (BENICAR HCT) 40-25 MG tablet TAKE 1 TABLET BY MOUTH  DAILY FOR BLOOD PRESSURE ,  REPLACES LOSARTAN/HCTZ TAB   predniSONE (DELTASONE) 20 MG tablet 2 tablets daily for 3 days, 1 tablet daily for 4 days.   Rimegepant Sulfate (NURTEC) 75 MG TBDP Take 1 tab under tongue once daily as needed with onset of migraine. (Patient not taking: Reported on 12/26/2020)   Semaglutide, 1 MG/DOSE, (OZEMPIC, 1 MG/DOSE,) 4 MG/3ML SOPN Inject 1 mg into the skin once a week.   temazepam (RESTORIL) 30 MG capsule TAKE 1 CAPSULE BY MOUTH EVERY NIGHT AT  BEDTIME AS NEEDED FOR INSOMNIA   valACYclovir (VALTREX) 500 MG tablet TAKE 1 TABLET BY MOUTH  TWICE DAILY AS NEEDED FOR  COLD SORES / FEVER BLISTERS   No current facility-administered medications on file prior to visit.    ROS: all negative except above.   Physical Exam:  There were no vitals taken for this visit.  General Appearance: Well nourished, in no apparent distress. Eyes: PERRLA, EOMs, conjunctiva no swelling or erythema Sinuses: No Frontal/maxillary tenderness ENT/Mouth: Ext aud  canals clear, TMs without erythema, bulging. No erythema, swelling, or exudate on post pharynx.  Tonsils not swollen or erythematous. Hearing normal.  Neck: Supple, thyroid normal.  Respiratory: Respiratory effort normal, BS equal bilaterally without rales, rhonchi, wheezing or stridor.  Cardio: RRR with no MRGs. Brisk peripheral pulses without edema.  Abdomen: Soft, + BS.  Non tender, no guarding, rebound, hernias, masses. Lymphatics: Non tender without lymphadenopathy.  Musculoskeletal: Full ROM, 5/5 strength, normal gait.  Skin: Warm, dry without rashes, lesions, ecchymosis.  Neuro: Cranial nerves intact. Normal muscle tone, no cerebellar symptoms. Sensation intact.  Psych: Awake and oriented X 3, normal affect, Insight and Judgment appropriate.     Magda Bernheim, NP 1:30 PM [facility]

## 2021-02-05 ENCOUNTER — Ambulatory Visit: Payer: 59 | Admitting: Nurse Practitioner

## 2021-02-13 ENCOUNTER — Other Ambulatory Visit: Payer: Self-pay | Admitting: Adult Health

## 2021-02-18 ENCOUNTER — Ambulatory Visit (INDEPENDENT_AMBULATORY_CARE_PROVIDER_SITE_OTHER): Payer: 59 | Admitting: Gastroenterology

## 2021-02-18 ENCOUNTER — Encounter: Payer: Self-pay | Admitting: Gastroenterology

## 2021-02-18 VITALS — BP 120/70 | HR 88 | Ht 61.0 in | Wt 177.2 lb

## 2021-02-18 DIAGNOSIS — R140 Abdominal distension (gaseous): Secondary | ICD-10-CM

## 2021-02-18 DIAGNOSIS — R110 Nausea: Secondary | ICD-10-CM

## 2021-02-18 DIAGNOSIS — R1010 Upper abdominal pain, unspecified: Secondary | ICD-10-CM | POA: Diagnosis not present

## 2021-02-18 DIAGNOSIS — K5909 Other constipation: Secondary | ICD-10-CM

## 2021-02-18 MED ORDER — PANTOPRAZOLE SODIUM 40 MG PO TBEC: 40.0000 mg | DELAYED_RELEASE_TABLET | Freq: Two times a day (BID) | ORAL | 3 refills | Status: DC

## 2021-02-18 MED ORDER — RIFAXIMIN 550 MG PO TABS: 550.0000 mg | ORAL_TABLET | Freq: Two times a day (BID) | ORAL | 0 refills | Status: DC

## 2021-02-18 NOTE — Patient Instructions (Addendum)
   PRESCRIPTION MEDICATION(S):   We have sent the following medication(s) to your pharmacy:  Start Pantoprazole 40 mg twice daily  Xifixan 550 mg twice daily for 14 days  Continue current medications including Famotidine 20 mg twice daily.  STOP using all BC Powders  NOTE: If your medication(s) requires a PRIOR AUTHORIZATION, we will receive notification from your pharmacy. Once received, the process to submit for approval may take up to 7-10 business days. You will be contacted about any denials we have received from your insurance company as well as alternatives recommended by your provider.   COLONOSCOPY AND ENDOSCOPY:   You have been scheduled for an endoscopy and a colonoscopy. Please follow the written instructions given to you at your visit today.  PREP:   Please pick up your prep supplies at the pharmacy within the next 1-3 days.  INHALERS:   If you use inhalers (even only as needed), please bring them with you on the day of your procedure.  COLONOSCOPY TIPS:  To reduce nausea and dehydration, stay well hydrated for 3-4 days prior to the exam.  To prevent skin/hemorrhoid irritation - prior to wiping, put A&Dointment or vaseline on the toilet paper. Keep a towel or pad on the bed.  BEFORE STARTING YOUR PREP, drink  64oz of clear liquids in the morning. This will help to flush the colon and will ensure you are well hydrated!!!!  NOTE - This is in addition to the fluids required for to complete your prep. Use of a flavored hard candy, such as grape Anise Salvo, can counteract some of the flavor of the prep and may prevent some nausea.   It was my pleasure to provide care to you today. Based on our discussion, I am providing you with my recommendations below:  RECOMMENDATION(S):    FOLLOW UP:  After your procedure, you will receive a call from my office staff regarding my recommendation for follow up.  BMI:  If you are age 47 or older, your body mass index should be  between 23-30. Your Body mass index is 33.48 kg/m. If this is out of the aforementioned range listed, please consider follow up with your Primary Care Provider.  If you are age 60 or younger, your body mass index should be between 19-25. Your Body mass index is 33.48 kg/m. If this is out of the aformentioned range listed, please consider follow up with your Primary Care Provider.   MY CHART:  The [facility] providers would like to encourage you to use [facility] to communicate with providers for non-urgent requests or questions.  Due to long hold times on the telephone, sending your provider a message by [facility] may be a faster and more efficient way to get a response.  Please allow 48 business hours for a response.  Please remember that this is for non-urgent requests.   Thank you for trusting me with your gastrointestinal care!    Thornton Park, MD, MPH  .

## 2021-02-18 NOTE — Progress Notes (Signed)
Referring Provider: Unk Pinto, MD Primary Care Physician:  Unk Pinto, MD   Chief complaint:  Abdominal pain, early satiety, constipation   IMPRESSION:  Upper abdominal pain, bloating, nausea, and early satiety Diverticulitis 2014 and 2019 Chronic constipation Minimally prominent pancreatic duct on ultrasound    - not seen on CT x 2  Hepatic steatosis on imaging Screening colonoscopy with Dr. Olevia Perches 2012  [facility] powder use  I wonder if her symptoms are not due to recurrent SIBO as her last episode of "diverticulitis" had atypical features and no radiographic diagnosis. The improvement that she experienced with antibiotics may have been due to SIBO instead of acute diverticulitis. However, has multiple upper symptoms that expand the differential to include esophagitis, gastritis, H pylori, functional dyspepsia, motility disorder, gastroparesis, as well as PUD given her history of NSAIDs use.   PLAN: - Continue current medications including famotidine 20 mg BID - Start pantoprazole 40 mg BID - Stop using all BC powders - Trial of Xifaxan 550 mg BID x 14 days, substitute with doxycycline 100 mg BID x 14 days if Xifaxan is cost prohibitive - EGD for evaluation of symptoms and screening colonoscopy with a two day bowel prep   HPI: Kelly Goodwin is a 60 y.o. female who returns in follow-up.  This is my first Visit with Kelly Goodwin.  She was originally seen by PA [place] in consultation 05/27/2018 after she had an episode of "diverticulitis."  She has a history of IBS, recurrent clinically diagnosed diverticulitis in 2014 and 2019 without concurrent diverticulitis seen on CT scan, PCOS, obesity, endometriosis, uterine fibroids, fatty liver on ultrasound, minimally prominent pancreatic duct on prior imaging, and GERD.  History of cholecystectomy in the 90s.  Prior episodes of "diverticulitis" have been associated with left lower quadrant pain, bloating, and and worsening  constipation. Diverticulosis without diverticulitis seen on CT obtained during these acute episodes. Fever improved with Cipro and Flagyl.  Now with abdominal pain worsened by constipation with associated bloating, eructation, and early satiety.  Symptoms worsened 2 months ago after she didn't have a bowel movement for 2 weeks. She wakes up nauseated in the morning. Feels like she is distended and her bra is tight. No dysphagia, odynophagia, blood in the stool. Frequent use of BC powder. She is concerned that she might have ulcers.  Distension worsens with eating, but also worsens without association with eating.   She has a history of chronic constipation treated with MiraLAX daily which resulted in liquid stools.  She will have a liquid bowel movement with mucous twice weekly. No sense of complete evacuation. No use of manual assistance with defecation. Will use prune juice, laxatives, and magnesium citrate when symptoms are severe. Linzess improved her constipation and resulted in hair loss. Amitiza did not provide relief.  Her primary care provider had prescribed Trulance. She doesn't remember being prescribed this medication or taking it.   Recent trial of Bentyl and famotidine started earlier this month. She started IBGard 2 weeks ago. She is unable to clarify the specific medications.   Stopped using BC powder 2 weeks ago.   She does not feel the abdominal pain and constipation are related.   She notes significant stress in her life.  Prior abdominal imaging: - CT abd/pelvis with contrast: diverticulosis without diverticulitis, prominent stool volume - Abdominal ultrasound 04/29/18: echogenic liver, slight prominence of the pancreatic duct. No mass or stone - CT abd/pelvis with contrast 06/10/18: diverticulosis without diverticulitis - Last CT of the  abdomen and pelvis 10/19/2019 showed colonic diverticulosis without diverticulitis, renal calculi, small uterine fibroids, and a healing fracture  of the 11th rib  Endoscopic history: -Colonoscopy with Dr. Maurene Capes in 2012: Mild sigmoid diverticulosis  Mother had a hiatal hernia and died after a surgery to repair it.   Past Medical History:  Diagnosis Date   Arthritis    Constipation, chronic    Diverticulitis    Diverticulosis    Fibroid uterus    History of gallstones    Hyperlipidemia    Hypertension    Hypothyroidism    Migraine    PCOS (polycystic ovarian syndrome)    takes metformin for this   Prediabetes    Thyroid disease     Past Surgical History:  Procedure Laterality Date   BREAST REDUCTION SURGERY  1988   BREAST SURGERY     CHOLECYSTECTOMY  1995   LASIK Left    REFRACTIVE SURGERY Left    TONSILLECTOMY AND ADENOIDECTOMY  1990's    Current Outpatient Medications  Medication Sig Dispense Refill   AMBULATORY NON FORMULARY MEDICATION Medication Name: IB Gard-Use as directed     AMBULATORY NON FORMULARY MEDICATION Medication Name: FD Gard-Use as directed     dicyclomine (BENTYL) 10 MG capsule 1 capsule TID PRN abd pain/cramping/diarrhea 60 capsule 0   estradiol-norethindrone (ACTIVELLA) 1-0.5 MG tablet TAKE 1 TABLET BY MOUTH  DAILY 84 tablet 3   famotidine (PEPCID) 20 MG tablet Take 1 tablet (20 mg total) by mouth 2 (two) times daily before a meal. 60 tablet 0   gabapentin (NEURONTIN) 300 MG capsule Take 1 capsule (300 mg total) by mouth 3 (three) times daily. 90 capsule 2   GARLIC PO Take 1 tablet by mouth 2 (two) times daily.     ibuprofen (ADVIL) 800 MG tablet TAKE 1/2 TO 1 TABLET BY MOUTH 3 TIMES DAILY (EVERY 8 HOURS) WITH FOOD AS NEEDED FOR PAIN AND INFLAMMATION. TRY TO LIMIT TO 1/2 TABLET AND FREQUENCY TO AVOID PERMANENT KIDNEY DAMAGE 90 tablet 2   lactulose (CHRONULAC) 10 GM/15ML solution Take 30 mLs (20 g total) by mouth 2 (two) times daily. 240 mL 0   levothyroxine (SYNTHROID) 100 MCG tablet TAKE 1 TABLET DAILY ON AN EMPTY STOMACH WITH WATER ONLY FOR 30 MIN AND NO ANTACIDS, CALCIUM, MAGNESIUM FOR 4 HRS.  AVOID BIOTIN. 90 tablet 3   lubiprostone (AMITIZA) 8 MCG capsule Take 1 capsule (8 mcg total) by mouth 2 (two) times daily with a meal. 60 capsule 0   metFORMIN (GLUCOPHAGE) 1000 MG tablet TAKE 1 TABLET TWICE DAILY FOR DIABETES 180 tablet 3   olmesartan-hydrochlorothiazide (BENICAR HCT) 40-25 MG tablet TAKE 1 TABLET BY MOUTH  DAILY FOR BLOOD PRESSURE ,  REPLACES LOSARTAN/HCTZ TAB 90 tablet 3   Rimegepant Sulfate (NURTEC) 75 MG TBDP Take 1 tab under tongue once daily as needed with onset of migraine. 2 tablet 0   Semaglutide, 1 MG/DOSE, (OZEMPIC, 1 MG/DOSE,) 4 MG/3ML SOPN Inject 1 mg into the skin once a week. 9 mL 1   temazepam (RESTORIL) 30 MG capsule TAKE 1 CAPSULE BY MOUTH EVERY NIGHT AT BEDTIME AS NEEDED FOR INSOMNIA 30 capsule 0   valACYclovir (VALTREX) 500 MG tablet TAKE 1 TABLET BY MOUTH  TWICE DAILY AS NEEDED FOR  COLD SORES / FEVER BLISTERS 180 tablet 3   No current facility-administered medications for this visit.    Allergies as of 02/18/2021   (No Known Allergies)    Family History  Problem Relation Age of  Onset   Diabetes Mother    Lung cancer Mother    COPD Mother    Diverticulitis Mother    Leukemia Mother    Heart disease Mother    Diabetes Sister    Colon cancer Neg Hx    Breast cancer Neg Hx    Stomach cancer Neg Hx    Pancreatic cancer Neg Hx    Liver disease Neg Hx       Physical Exam: General:   Alert,  well-nourished, pleasant and cooperative in NAD Head:  Normocephalic and atraumatic. Eyes:  Sclera clear, no icterus.   Conjunctiva pink. Ears:  Normal auditory acuity. Nose:  No deformity, discharge,  or lesions. Mouth:  No deformity or lesions.   Neck:  Supple; no masses or thyromegaly. Lungs:  Clear throughout to auscultation.   No wheezes. Heart:  Regular rate and rhythm; no murmurs. Abdomen:  Soft, mild epigastric pain with deep palpation, central obesity, nondistended, normal bowel sounds, no rebound or guarding. No hepatosplenomegaly.   Rectal:   Deferred  Msk:  Symmetrical. No boney deformities LAD: No inguinal or umbilical LAD Extremities:  No clubbing or edema. Neurologic:  Alert and  oriented x4;  grossly nonfocal Skin:  Intact without significant lesions or rashes. Psych:  Alert and cooperative. Normal mood and affect.   Lloyd Ayo L. Tarri Glenn, MD, MPH 02/18/2021, 10:18 AM

## 2021-02-19 ENCOUNTER — Telehealth: Payer: Self-pay

## 2021-02-19 NOTE — Telephone Encounter (Signed)
PRIOR AUTHORIZATION  PA initiation date: 02/19/21  Medication: [place]: [facility] Submission completed electronically through [employer] My Meds: Yes  Will await insurance response re: approval/denial.  Kelly Goodwin (Key: [id number])  [facility] is reviewing your PA request. Typically an electronic response will be received within 24-72 hours. To check for an update later, open this request from your dashboard.  You may close this dialog and return to your dashboard to perform other tasks.  Kelly Goodwin (Key: [id number]) Rx #: [id number] Xifaxan 550MG  tablets   Form [facility] Electronic Prior Authorization Form (2017 NCPDP) Created 22 hours ago Sent to Plan 5 minutes ago Plan Response 4 minutes ago Submit Clinical Questions 4 minutes ago Determination Wait for Determination Please wait for [facility] 2017 NCPDP to return a determination.

## 2021-02-19 NOTE — Telephone Encounter (Signed)
APPROVAL  Medication: [employer]: [facility] PA response: APPROVED Approval dates: 02/19/21 through 03/05/21 Misc. Notes: Kelly Goodwin (Key: [id number])  This request has received a Favorable outcome.  Please note any additional information provided by [facility] at the bottom of your screen.  Kelly Goodwin (Key: [id number]) Rx #: [id number] Xifaxan 550MG  tablets   Form [facility] Electronic Prior Authorization Form (2017 NCPDP) Created 22 hours ago Sent to Plan 7 minutes ago Plan Response 6 minutes ago Submit Clinical Questions 6 minutes ago Determination Favorable 2 minutes ago Message from Plan Request Reference Number: [id number]. XIFAXAN TAB 550MG  is approved through 03/05/2021. Your patient may now fill this prescription and it will be covered.

## 2021-03-09 ENCOUNTER — Other Ambulatory Visit: Payer: Self-pay | Admitting: Nurse Practitioner

## 2021-03-10 ENCOUNTER — Other Ambulatory Visit: Payer: Self-pay | Admitting: Physician Assistant

## 2021-03-31 ENCOUNTER — Encounter: Payer: Self-pay | Admitting: Certified Registered Nurse Anesthetist

## 2021-04-01 ENCOUNTER — Ambulatory Visit (AMBULATORY_SURGERY_CENTER): Payer: 59 | Admitting: Gastroenterology

## 2021-04-01 ENCOUNTER — Encounter: Payer: Self-pay | Admitting: Gastroenterology

## 2021-04-01 ENCOUNTER — Other Ambulatory Visit: Payer: Self-pay

## 2021-04-01 VITALS — BP 122/61 | HR 81 | Temp 96.2°F | Resp 15 | Ht 61.0 in | Wt 177.0 lb

## 2021-04-01 DIAGNOSIS — K319 Disease of stomach and duodenum, unspecified: Secondary | ICD-10-CM | POA: Diagnosis not present

## 2021-04-01 DIAGNOSIS — K2970 Gastritis, unspecified, without bleeding: Secondary | ICD-10-CM

## 2021-04-01 DIAGNOSIS — R1010 Upper abdominal pain, unspecified: Secondary | ICD-10-CM

## 2021-04-01 DIAGNOSIS — R6881 Early satiety: Secondary | ICD-10-CM

## 2021-04-01 DIAGNOSIS — K219 Gastro-esophageal reflux disease without esophagitis: Secondary | ICD-10-CM | POA: Diagnosis not present

## 2021-04-01 DIAGNOSIS — Z1211 Encounter for screening for malignant neoplasm of colon: Secondary | ICD-10-CM

## 2021-04-01 DIAGNOSIS — K2101 Gastro-esophageal reflux disease with esophagitis, with bleeding: Secondary | ICD-10-CM

## 2021-04-01 DIAGNOSIS — K5909 Other constipation: Secondary | ICD-10-CM

## 2021-04-01 DIAGNOSIS — K259 Gastric ulcer, unspecified as acute or chronic, without hemorrhage or perforation: Secondary | ICD-10-CM

## 2021-04-01 DIAGNOSIS — Z538 Procedure and treatment not carried out for other reasons: Secondary | ICD-10-CM

## 2021-04-01 DIAGNOSIS — R140 Abdominal distension (gaseous): Secondary | ICD-10-CM | POA: Diagnosis not present

## 2021-04-01 DIAGNOSIS — R1030 Lower abdominal pain, unspecified: Secondary | ICD-10-CM

## 2021-04-01 DIAGNOSIS — R110 Nausea: Secondary | ICD-10-CM

## 2021-04-01 MED ORDER — SODIUM CHLORIDE 0.9 % IV SOLN: 500.0000 mL | Freq: Once | INTRAVENOUS | Status: DC

## 2021-04-01 NOTE — Op Note (Signed)
[facility] Patient Name: Kelly Goodwin Procedure Date: 04/01/2021 1:21 PM MRN: [id number] Endoscopist: Thornton Park MD, MD Age: 60 Referring MD:  Date of Birth: Jun 05, 1960 Gender: Female Account #: [id number] Procedure:                Colonoscopy Indications:              Screening for colorectal malignant neoplasm                           Normal screening colonoscopy with Dr. Olevia Perches 2012 Medicines:                Monitored Anesthesia Care Procedure:                Pre-Anesthesia Assessment:                           - Prior to the procedure, a History and Physical                            was performed, and patient medications and                            allergies were reviewed. The patient's tolerance of                            previous anesthesia was also reviewed. The risks                            and benefits of the procedure and the sedation                            options and risks were discussed with the patient.                            All questions were answered, and informed consent                            was obtained. Prior Anticoagulants: The patient has                            taken no previous anticoagulant or antiplatelet                            agents. ASA Grade Assessment: II - A patient with                            mild systemic disease. After reviewing the risks                            and benefits, the patient was deemed in                            satisfactory condition to undergo the procedure.  After obtaining informed consent, the colonoscope                            was passed under direct vision. Throughout the                            procedure, the patient's blood pressure, pulse, and                            oxygen saturations were monitored continuously. The                            CF [id number] #[id number] was introduced through the anus                            with the  intention of advancing to the cecum. The                            scope was advanced to the ascending colon before                            the procedure was aborted. Medications were given.                            The colonoscopy was technically difficult and                            complex due to inadequate bowel prep. There was a                            large amount of stool and residual food fiber in                            the colon. The fiber pieces were large enough to                            clog the colonoscope. The patient tolerated the                            procedure well. The quality of the bowel                            preparation was poor. Scope In: 1:49:51 PM Scope Out: 1:59:58 PM Total Procedure Duration: 0 hours 10 minutes 7 seconds  Findings:                 The perianal and digital rectal examinations were                            normal.                           A large amount of stool was found in the entire  colon. The fiber pieces were large enough to                            continuously clog the colonoscope, limiting my                            ability to evaluate for small, medium sized and                            flat polyps. Complications:            No immediate complications. Estimated Blood Loss:     Estimated blood loss: none. Impression:               - Preparation of the colon was poor.                           - Stool in the entire examined colon.                           - No specimens collected. Recommendation:           - Patient has a contact number available for                            emergencies. The signs and symptoms of potential                            delayed complications were discussed with the                            patient. Return to normal activities tomorrow.                            Written discharge instructions were provided to the                             patient.                           - Resume previous diet.                           - Continue present medications.                           - Repeat colonoscopy at the next available                            appointment because the bowel preparation was poor.                            Plan Miralax 17 g BID for 5 days prior to the 2 day                            bowel prep.                           -  Emerging evidence supports eating a diet of                            fruits, vegetables, grains, calcium, and yogurt                            while reducing red meat and alcohol may reduce the                            risk of colon cancer.                           - Thank you for allowing me to be involved in your                            colon cancer prevention. Thornton Park MD, MD 04/01/2021 2:10:20 PM This report has been signed electronically.

## 2021-04-01 NOTE — Progress Notes (Signed)
Report given to PACU, vss 

## 2021-04-01 NOTE — Progress Notes (Signed)
Referring Provider: Unk Pinto, MD Primary Care Physician:  Unk Pinto, MD   Indication for Procedure:  Abdominal pain, early satiety, constipation   IMPRESSION:  Upper abdominal pain, bloating, nausea, and early satiety Diverticulitis 2014 and 2019 Chronic constipation Minimally prominent pancreatic duct on ultrasound    - not seen on CT x 2  Hepatic steatosis on imaging Screening colonoscopy with Dr. Olevia Perches 2012  [facility] powder use Appropriate candidate for MAC in the [place]  PLAN: EGD for evaluation of symptoms and screening colonoscopy with a two day bowel prep   HPI: Kelly Goodwin is a 60 y.o. female who presents for endoscopic evaluation of upper abdominal symptoms and screening colonoscopy.  Please see my 02/18/21 office note for complete details.   Prior episodes of "diverticulitis" have been associated with left lower quadrant pain, bloating, and and worsening constipation. Diverticulosis without diverticulitis seen on CT obtained during these acute episodes. Fever improved with Cipro and Flagyl.  Now with abdominal pain worsened by constipation with associated bloating, eructation, and early satiety.  Symptoms worsened 2 months ago after she didn't have a bowel movement for 2 weeks. She wakes up nauseated in the morning. Feels like she is distended and her bra is tight. No dysphagia, odynophagia, blood in the stool. Frequent use of BC powder. She is concerned that she might have ulcers.  Distension worsens with eating, but also worsens without association with eating.   She has a history of chronic constipation treated with MiraLAX daily which resulted in liquid stools.  She will have a liquid bowel movement with mucous twice weekly. No sense of complete evacuation. No use of manual assistance with defecation. Will use prune juice, laxatives, and magnesium citrate when symptoms are severe. Linzess improved her constipation and resulted in hair loss. Amitiza did  not provide relief.  Her primary care provider had prescribed Trulance. She doesn't remember being prescribed this medication or taking it.   Recent trial of Bentyl and famotidine started earlier this month. She started IBGard 2 weeks ago. She is unable to clarify the specific medications.   Stopped using BC powder 2 weeks ago.   She does not feel the abdominal pain and constipation are related.   She notes significant stress in her life.  Prior abdominal imaging: - CT abd/pelvis with contrast: diverticulosis without diverticulitis, prominent stool volume - Abdominal ultrasound 04/29/18: echogenic liver, slight prominence of the pancreatic duct. No mass or stone - CT abd/pelvis with contrast 06/10/18: diverticulosis without diverticulitis - Last CT of the abdomen and pelvis 10/19/2019 showed colonic diverticulosis without diverticulitis, renal calculi, small uterine fibroids, and a healing fracture of the 11th rib  Endoscopic history: -Colonoscopy with Dr. Maurene Capes in 2012: Mild sigmoid diverticulosis  Mother had a hiatal hernia and died after a surgery to repair it.   Past Medical History:  Diagnosis Date   Arthritis    Constipation, chronic    Diverticulitis    Diverticulosis    Fibroid uterus    History of gallstones    Hyperlipidemia    Hypertension    Hypothyroidism    Migraine    PCOS (polycystic ovarian syndrome)    takes metformin for this   Prediabetes    Thyroid disease     Past Surgical History:  Procedure Laterality Date   BREAST REDUCTION SURGERY  1988   BREAST SURGERY     CHOLECYSTECTOMY  1995   LASIK Left    REFRACTIVE SURGERY Left    TONSILLECTOMY AND ADENOIDECTOMY  1990's    Current Outpatient Medications  Medication Sig Dispense Refill   AMBULATORY NON FORMULARY MEDICATION Medication Name: IB Gard-Use as directed     AMBULATORY NON FORMULARY MEDICATION Medication Name: FD Gard-Use as directed     dicyclomine (BENTYL) 10 MG capsule TAKE 1 CAPSULE BY  MOUTH THREE TIMES DAILY AS NEEDED FOR ABDOMINAL PAIN OR CRAMPING OR DIARRHEA 60 capsule 0   estradiol-norethindrone (ACTIVELLA) 1-0.5 MG tablet TAKE 1 TABLET BY MOUTH  DAILY 84 tablet 3   famotidine (PEPCID) 20 MG tablet TAKE 1 TABLET(20 MG) BY MOUTH TWICE DAILY BEFORE A MEAL 60 tablet 0   gabapentin (NEURONTIN) 300 MG capsule Take 1 capsule (300 mg total) by mouth 3 (three) times daily. 90 capsule 2   GARLIC PO Take 1 tablet by mouth 2 (two) times daily.     ibuprofen (ADVIL) 800 MG tablet TAKE 1/2 TO 1 TABLET BY MOUTH 3 TIMES DAILY (EVERY 8 HOURS) WITH FOOD AS NEEDED FOR PAIN AND INFLAMMATION. TRY TO LIMIT TO 1/2 TABLET AND FREQUENCY TO AVOID PERMANENT KIDNEY DAMAGE 90 tablet 2   lactulose (CHRONULAC) 10 GM/15ML solution Take 30 mLs (20 g total) by mouth 2 (two) times daily. 240 mL 0   levothyroxine (SYNTHROID) 100 MCG tablet TAKE 1 TABLET DAILY ON AN EMPTY STOMACH WITH WATER ONLY FOR 30 MIN AND NO ANTACIDS, CALCIUM, MAGNESIUM FOR 4 HRS. AVOID BIOTIN. 90 tablet 3   lubiprostone (AMITIZA) 8 MCG capsule Take 1 capsule (8 mcg total) by mouth 2 (two) times daily with a meal. 60 capsule 0   metFORMIN (GLUCOPHAGE) 1000 MG tablet TAKE 1 TABLET TWICE DAILY FOR DIABETES 180 tablet 3   olmesartan-hydrochlorothiazide (BENICAR HCT) 40-25 MG tablet TAKE 1 TABLET BY MOUTH  DAILY FOR BLOOD PRESSURE ,  REPLACES LOSARTAN/HCTZ TAB 90 tablet 3   pantoprazole (PROTONIX) 40 MG tablet Take 1 tablet (40 mg total) by mouth 2 (two) times daily. 90 tablet 3   rifaximin (XIFAXAN) 550 MG TABS tablet Take 1 tablet (550 mg total) by mouth 2 (two) times daily. 28 tablet 0   Rimegepant Sulfate (NURTEC) 75 MG TBDP Take 1 tab under tongue once daily as needed with onset of migraine. 2 tablet 0   Semaglutide, 1 MG/DOSE, (OZEMPIC, 1 MG/DOSE,) 4 MG/3ML SOPN Inject 1 mg into the skin once a week. 9 mL 1   temazepam (RESTORIL) 30 MG capsule TAKE 1 CAPSULE BY MOUTH EVERY NIGHT AT BEDTIME AS NEEDED FOR INSOMNIA 30 capsule 0   valACYclovir  (VALTREX) 500 MG tablet TAKE 1 TABLET BY MOUTH  TWICE DAILY AS NEEDED FOR  COLD SORES / FEVER BLISTERS 180 tablet 3   Current Facility-Administered Medications  Medication Dose Route Frequency Provider Last Rate Last Admin   0.9 %  sodium chloride infusion  500 mL Intravenous Once Thornton Park, MD        Allergies as of 04/01/2021   (No Known Allergies)    Family History  Problem Relation Age of Onset   Diabetes Mother    Lung cancer Mother    COPD Mother    Diverticulitis Mother    Leukemia Mother    Heart disease Mother    Diabetes Sister    Colon cancer Neg Hx    Breast cancer Neg Hx    Stomach cancer Neg Hx    Pancreatic cancer Neg Hx    Liver disease Neg Hx       Physical Exam: General:   Alert,  well-nourished, pleasant and cooperative  in NAD Head:  Normocephalic and atraumatic. Eyes:  Sclera clear, no icterus.   Conjunctiva pink. Ears:  Normal auditory acuity. Nose:  No deformity, discharge,  or lesions. Mouth:  No deformity or lesions.   Neck:  Supple; no masses or thyromegaly. Lungs:  Clear throughout to auscultation.   No wheezes. Heart:  Regular rate and rhythm; no murmurs. Abdomen:  Soft, mild epigastric pain with deep palpation, central obesity, nondistended, normal bowel sounds, no rebound or guarding. No hepatosplenomegaly.   Rectal:  Deferred  Msk:  Symmetrical. No boney deformities LAD: No inguinal or umbilical LAD Extremities:  No clubbing or edema. Neurologic:  Alert and  oriented x4;  grossly nonfocal Skin:  Intact without significant lesions or rashes. Psych:  Alert and cooperative. Normal mood and affect.   Kelly Goodwin L. Tarri Glenn, MD, MPH 04/01/2021, 1:18 PM

## 2021-04-01 NOTE — Patient Instructions (Signed)
Please read handouts provided. Continue present medications, including pantoprazole 40 mg twice daily. No aspirin, ibuprofen, naproxen, or other non-steriodal anti-inflammatory drugs. Await pathology results.   YOU HAD AN ENDOSCOPIC PROCEDURE TODAY AT [facility]:   Refer to the procedure report that was given to you for any specific questions about what was found during the examination.  If the procedure report does not answer your questions, please call your gastroenterologist to clarify.  If you requested that your care partner not be given the details of your procedure findings, then the procedure report has been included in a sealed envelope for you to review at your convenience later.  YOU SHOULD EXPECT: Some feelings of bloating in the abdomen. Passage of more gas than usual.  Walking can help get rid of the air that was put into your GI tract during the procedure and reduce the bloating. If you had a lower endoscopy (such as a colonoscopy or flexible sigmoidoscopy) you may notice spotting of blood in your stool or on the toilet paper. If you underwent a bowel prep for your procedure, you may not have a normal bowel movement for a few days.  Please Note:  You might notice some irritation and congestion in your nose or some drainage.  This is from the oxygen used during your procedure.  There is no need for concern and it should clear up in a day or so.  SYMPTOMS TO REPORT IMMEDIATELY:  Following lower endoscopy (colonoscopy or flexible sigmoidoscopy):  Excessive amounts of blood in the stool  Significant tenderness or worsening of abdominal pains  Swelling of the abdomen that is new, acute  Fever of 100F or higher  Following upper endoscopy (EGD)  Vomiting of blood or coffee ground material  New chest pain or pain under the shoulder blades  Painful or persistently difficult swallowing  New shortness of breath  Fever of 100F or higher  Black, tarry-looking  stools  For urgent or emergent issues, a gastroenterologist can be reached at any hour by calling [phone]. Do not use MyChart messaging for urgent concerns.    DIET:  We do recommend a small meal at first, but then you may proceed to your regular diet.  Drink plenty of fluids but you should avoid alcoholic beverages for 24 hours.  ACTIVITY:  You should plan to take it easy for the rest of today and you should NOT DRIVE or use heavy machinery until tomorrow (because of the sedation medicines used during the test).    FOLLOW UP: Our staff will call the number listed on your records 48-72 hours following your procedure to check on you and address any questions or concerns that you may have regarding the information given to you following your procedure. If we do not reach you, we will leave a message.  We will attempt to reach you two times.  During this call, we will ask if you have developed any symptoms of COVID 19. If you develop any symptoms (ie: fever, flu-like symptoms, shortness of breath, cough etc.) before then, please call [phone].  If you test positive for Covid 19 in the 2 weeks post procedure, please call and report this information to [place].    If any biopsies were taken you will be contacted by phone or by letter within the next 1-3 weeks.  Please call us at [phone] if you have not heard about the biopsies in 3 weeks.    SIGNATURES/CONFIDENTIALITY: You and/or your care partner  have signed paperwork which will be entered into your electronic medical record.  These signatures attest to the fact that that the information above on your After Visit Summary has been reviewed and is understood.  Full responsibility of the confidentiality of this discharge information lies with you and/or your care-partner.

## 2021-04-01 NOTE — Progress Notes (Signed)
1334 Robinul 0.1 mg IV given due large amount of secretions upon assessment.  MD made aware, vss  

## 2021-04-01 NOTE — Progress Notes (Signed)
1340 HR > 100 with esmolol 25 mg given IV, MD updated, vss  

## 2021-04-01 NOTE — Progress Notes (Signed)
1358 HR > 100 with esmolol 25 mg given IV, MD updated, vss 

## 2021-04-01 NOTE — Progress Notes (Signed)
Dr. Tarri Glenn requesting to schedule patient for a new colonoscopy. Nurse asked patient for dates. Patient stating she will call back to schedule colonoscopy after looking at her work schedule. B.Dacey Milberger RN.

## 2021-04-01 NOTE — Progress Notes (Signed)
VS completed by DT.  Pt's states no medical or surgical changes since previsit or office visit.  

## 2021-04-01 NOTE — Op Note (Signed)
[name] Procedure Date: 04/01/2021 1:20 PM MRN: [id number] Endoscopist: Thornton Park MD, MD Age: 60 Referring MD:  Date of Birth: 08-09-1960 Gender: Female Account #: [id number] Procedure:                Upper GI endoscopy Indications:              Upper abdominal pain, Abdominal bloating, Early                            satiety, Nausea Medicines:                Monitored Anesthesia Care Procedure:                Pre-Anesthesia Assessment:                           - Prior to the procedure, a History and Physical                            was performed, and patient medications and                            allergies were reviewed. The patient's tolerance of                            previous anesthesia was also reviewed. The risks                            and benefits of the procedure and the sedation                            options and risks were discussed with the patient.                            All questions were answered, and informed consent                            was obtained. Prior Anticoagulants: The patient has                            taken no previous anticoagulant or antiplatelet                            agents. ASA Grade Assessment: II - A patient with                            mild systemic disease. After reviewing the risks                            and benefits, the patient was deemed in                            satisfactory condition to undergo the procedure.  After obtaining informed consent, the endoscope was                            passed under direct vision. Throughout the                            procedure, the patient's blood pressure, pulse, and                            oxygen saturations were monitored continuously. The                            Endoscope was introduced through the mouth, and                            advanced to the third part of duodenum.  The upper                            GI endoscopy was accomplished without difficulty.                            The patient tolerated the procedure well. Scope In: Scope Out: Findings:                 The examined esophagus was normal. The z-line was                            located 36 cm from the incisors. Biopsies were                            taken with a cold forceps for histology. Estimated                            blood loss was minimal.                           Localized mild inflammation characterized by linear                            erosions and diffuse erythematous mucosa was found                            in the gastric body. Biopsies were taken from the                            antrum, body, and fundus with a cold forceps for                            histology. Estimated blood loss was minimal.                           The examined duodenum was normal. Biopsies were  taken with a cold forceps for histology. Estimated                            blood loss was minimal.                           The cardia and gastric fundus were normal on                            retroflexion.                           The exam was otherwise without abnormality. Complications:            No immediate complications. Estimated blood loss:                            Minimal. Estimated Blood Loss:     Estimated blood loss was minimal. Impression:               - Normal esophagus. Biopsied.                           - Gastritis. Biopsied.                           - Normal examined duodenum. Biopsied.                           - The examination was otherwise normal. Recommendation:           - Patient has a contact number available for                            emergencies. The signs and symptoms of potential                            delayed complications were discussed with the                            patient. Return to normal activities  tomorrow.                            Written discharge instructions were provided to the                            patient.                           - Resume previous diet.                           - Continue present medications including                            pantoprazole 40 mg twice daily.                           -  No aspirin, ibuprofen, naproxen, or other                            non-steroidal anti-inflammatory drugs.                           - Await pathology results. Thornton Park MD, MD 04/01/2021 2:04:16 PM This report has been signed electronically.

## 2021-04-01 NOTE — Progress Notes (Signed)
Called to room to assist during endoscopic procedure.  Patient ID and intended procedure confirmed with present staff. Received instructions for my participation in the procedure from the performing physician.  

## 2021-04-03 ENCOUNTER — Telehealth: Payer: Self-pay

## 2021-04-03 NOTE — Telephone Encounter (Signed)
  Follow up Call-  Call back number 04/01/2021  Post procedure Call Back phone  # [phone]  Permission to leave phone message Yes  Some recent data might be hidden     Patient questions:  Do you have a fever, pain , or abdominal swelling? No. Pain Score  0 *  Have you tolerated food without any problems? Yes.    Have you been able to return to your normal activities? Yes.    Do you have any questions about your discharge instructions: Diet   No. Medications  No. Follow up visit  No.  Do you have questions or concerns about your Care? No.  Actions: * If pain score is 4 or above: No action needed, pain <4.   Have you developed a fever since your procedure? no  2.   Have you had an respiratory symptoms (SOB or cough) since your procedure? no  3.   Have you tested positive for COVID 19 since your procedure no  4.   Have you had any family members/close contacts diagnosed with the COVID 19 since your procedure?  no   If yes to any of these questions please route to Joylene John, RN and Joella Prince, RN

## 2021-04-08 ENCOUNTER — Other Ambulatory Visit: Payer: Self-pay

## 2021-04-08 ENCOUNTER — Telehealth: Payer: Self-pay | Admitting: Gastroenterology

## 2021-04-08 ENCOUNTER — Other Ambulatory Visit: Payer: Self-pay | Admitting: Physician Assistant

## 2021-04-08 ENCOUNTER — Other Ambulatory Visit: Payer: Self-pay | Admitting: Nurse Practitioner

## 2021-04-08 MED ORDER — PLENVU 140 G PO SOLR: ORAL | 0 refills | Status: DC

## 2021-04-08 NOTE — Telephone Encounter (Signed)
Spoke with patient and let her know that Plenvu was sent in to her pharmacy and that prep instructions were on mychart.

## 2021-04-08 NOTE — Telephone Encounter (Signed)
Inbound call from patient states she  would like a different prep. States she does not believe Miralax and Gatorade will work for her. Best contact number [phone]

## 2021-04-15 ENCOUNTER — Other Ambulatory Visit: Payer: Self-pay

## 2021-04-15 DIAGNOSIS — K2970 Gastritis, unspecified, without bleeding: Secondary | ICD-10-CM

## 2021-04-15 DIAGNOSIS — K209 Esophagitis, unspecified without bleeding: Secondary | ICD-10-CM

## 2021-04-15 DIAGNOSIS — R110 Nausea: Secondary | ICD-10-CM

## 2021-04-15 DIAGNOSIS — R1010 Upper abdominal pain, unspecified: Secondary | ICD-10-CM

## 2021-04-15 MED ORDER — PANTOPRAZOLE SODIUM 40 MG PO TBEC: 40.0000 mg | DELAYED_RELEASE_TABLET | Freq: Two times a day (BID) | ORAL | 3 refills | Status: DC

## 2021-04-23 ENCOUNTER — Encounter: Payer: 59 | Admitting: Gastroenterology

## 2021-04-23 NOTE — Progress Notes (Signed)
Reviewed. Thank you.

## 2021-05-06 ENCOUNTER — Encounter: Payer: Self-pay | Admitting: Certified Registered Nurse Anesthetist

## 2021-05-07 ENCOUNTER — Ambulatory Visit (AMBULATORY_SURGERY_CENTER): Payer: 59 | Admitting: Gastroenterology

## 2021-05-07 ENCOUNTER — Encounter: Payer: Self-pay | Admitting: Gastroenterology

## 2021-05-07 VITALS — BP 116/61 | HR 85 | Temp 98.2°F | Resp 16 | Ht 61.0 in | Wt 177.0 lb

## 2021-05-07 DIAGNOSIS — K5730 Diverticulosis of large intestine without perforation or abscess without bleeding: Secondary | ICD-10-CM | POA: Diagnosis not present

## 2021-05-07 DIAGNOSIS — K5909 Other constipation: Secondary | ICD-10-CM | POA: Diagnosis not present

## 2021-05-07 DIAGNOSIS — L814 Other melanin hyperpigmentation: Secondary | ICD-10-CM

## 2021-05-07 DIAGNOSIS — D125 Benign neoplasm of sigmoid colon: Secondary | ICD-10-CM

## 2021-05-07 DIAGNOSIS — D123 Benign neoplasm of transverse colon: Secondary | ICD-10-CM

## 2021-05-07 DIAGNOSIS — R140 Abdominal distension (gaseous): Secondary | ICD-10-CM

## 2021-05-07 DIAGNOSIS — R1030 Lower abdominal pain, unspecified: Secondary | ICD-10-CM

## 2021-05-07 DIAGNOSIS — R1010 Upper abdominal pain, unspecified: Secondary | ICD-10-CM

## 2021-05-07 MED ORDER — SODIUM CHLORIDE 0.9 % IV SOLN: 500.0000 mL | INTRAVENOUS | Status: DC

## 2021-05-07 NOTE — Progress Notes (Signed)
Pt's states no medical or surgical changes since previsit or office visit. 

## 2021-05-07 NOTE — Progress Notes (Signed)
Called to room to assist during endoscopic procedure.  Patient ID and intended procedure confirmed with present staff. Received instructions for my participation in the procedure from the performing physician.  

## 2021-05-07 NOTE — Progress Notes (Signed)
Report given to PACU, vss 

## 2021-05-07 NOTE — Op Note (Signed)
[place] Patient Name: Kelly Goodwin Procedure Date: 05/07/2021 4:00 PM MRN: [id number] Endoscopist: Thornton Park MD, MD Age: 61 Referring MD:  Date of Birth: 06-Sep-1960 Gender: Female Account #: [id number] Procedure:                Colonoscopy Indications:              Screening for colorectal malignant neoplasm                           Normal colonoscopy 2012                           Unable to complete colonoscopy due to poor prep                            04/01/21                           No known family history of colon cancer or polyps                           Incidental history: Constipation Medicines:                Monitored Anesthesia Care Procedure:                Pre-Anesthesia Assessment:                           - Prior to the procedure, a History and Physical                            was performed, and patient medications and                            allergies were reviewed. The patient's tolerance of                            previous anesthesia was also reviewed. The risks                            and benefits of the procedure and the sedation                            options and risks were discussed with the patient.                            All questions were answered, and informed consent                            was obtained. Prior Anticoagulants: The patient has                            taken no previous anticoagulant or antiplatelet                            agents. ASA Grade Assessment: II -  A patient with                            mild systemic disease. After reviewing the risks                            and benefits, the patient was deemed in                            satisfactory condition to undergo the procedure.                           After obtaining informed consent, the colonoscope                            was passed under direct vision. Throughout the                            procedure, the patient's blood  pressure, pulse, and                            oxygen saturations were monitored continuously. The                            CF [id number] #[id number] was introduced through the anus                            and advanced to the the cecum, identified by                            appendiceal orifice and ileocecal valve. A second                            forward view of the right colon was performed. The                            colonoscopy was performed without difficulty. The                            patient tolerated the procedure well. The quality                            of the bowel preparation was good. The ileocecal                            valve, appendiceal orifice, and rectum were                            photographed. Scope In: 4:10:58 PM Scope Out: 4:27:26 PM Scope Withdrawal Time: 0 hours 10 minutes 38 seconds  Total Procedure Duration: 0 hours 16 minutes 28 seconds  Findings:                 The perianal and digital rectal examinations were  normal.                           Multiple small and large-mouthed diverticula were                            found in the sigmoid colon and descending colon.                           A 4-5 mm polyp was found in the sigmoid colon. The                            polyp was sessile. The polyp was removed with a                            cold snare. Resection and retrieval were complete.                            Estimated blood loss was minimal.                           A 3 mm polyp was found in the transverse colon. The                            polyp was sessile. The polyp was removed with a                            cold snare. Resection and retrieval were complete.                            Estimated blood loss was minimal.                           The exam was otherwise without abnormality on                            direct and retroflexion views. Complications:            No immediate  complications. Estimated blood loss:                            Minimal. Estimated Blood Loss:     Estimated blood loss was minimal. Impression:               - Diverticulosis in the sigmoid colon and in the                            descending colon.                           - One 4-5 mm polyp in the sigmoid colon, removed                            with a cold snare. Resected and retrieved.                           -  One 3 mm polyp in the transverse colon, removed                            with a cold snare. Resected and retrieved.                           - The examination was otherwise normal on direct                            and retroflexion views. Recommendation:           - Patient has a contact number available for                            emergencies. The signs and symptoms of potential                            delayed complications were discussed with the                            patient. Return to normal activities tomorrow.                            Written discharge instructions were provided to the                            patient.                           - Resume previous diet.                           - Continue present medications.                           - Await pathology results.                           - Repeat colonoscopy date to be determined after                            pending pathology results are reviewed for                            surveillance. Plan two day bowel prep with future                            colonoscopies.                           - Emerging evidence supports eating a diet of                            fruits, vegetables, grains, calcium, and yogurt  while reducing red meat and alcohol may reduce the                            risk of colon cancer.                           - Thank you for allowing me to be involved in your                            colon cancer prevention. Thornton Park MD, MD 05/07/2021 4:37:04 PM This report has been signed electronically.

## 2021-05-07 NOTE — Progress Notes (Signed)
Referring Provider: Unk Pinto, MD Primary Care Physician:  Unk Pinto, MD   Indications for procedure:  Abdominal pain, early satiety, constipation   IMPRESSION:  Upper abdominal pain, bloating, nausea, and early satiety Diverticulitis 2014 and 2019 Chronic constipation Minimally prominent pancreatic duct on ultrasound    - not seen on CT x 2  Hepatic steatosis on imaging Screening colonoscopy with Dr. Olevia Perches 2012  [facility] powder use  PLAN: EGD for evaluation of symptoms and screening colonoscopy  She remains an appropriate candidate for monitored anesthesia care in the [facility]   HPI: Kelly Goodwin is a 61 y.o. female who returns for endoscopic evaluation of abdominal pain, bloating, nausea, and early satiety. Upper endoscopy showed reflux esophagitis and H pylori negative gastropathy. Incomplete evaluation with colonoscopy 04/01/21 due to poor prep. Returns now for repeat attempt at colonoscopy.    Prior abdominal imaging: - CT abd/pelvis with contrast: diverticulosis without diverticulitis, prominent stool volume - Abdominal ultrasound 04/29/18: echogenic liver, slight prominence of the pancreatic duct. No mass or stone - CT abd/pelvis with contrast 06/10/18: diverticulosis without diverticulitis - Last CT of the abdomen and pelvis 10/19/2019 showed colonic diverticulosis without diverticulitis, renal calculi, small uterine fibroids, and a healing fracture of the 11th rib  Endoscopic history: -Colonoscopy with Dr. Maurene Capes in 2012: Mild sigmoid diverticulosis  Mother had a hiatal hernia and died after a surgery to repair it.   Past Medical History:  Diagnosis Date   Arthritis    Constipation, chronic    Diverticulitis    Diverticulosis    Fibroid uterus    History of gallstones    Hyperlipidemia    Hypertension    Hypothyroidism    Migraine    PCOS (polycystic ovarian syndrome)    takes metformin for this   Prediabetes    Thyroid disease     Past  Surgical History:  Procedure Laterality Date   BREAST REDUCTION SURGERY  1988   BREAST SURGERY     CHOLECYSTECTOMY  1995   COLONOSCOPY     LASIK Left    REFRACTIVE SURGERY Left    TONSILLECTOMY AND ADENOIDECTOMY  1990's    Current Outpatient Medications  Medication Sig Dispense Refill   dicyclomine (BENTYL) 10 MG capsule TAKE 1 CAPSULE BY MOUTH THREE TIMES DAILY AS NEEDED FOR ABDOMINAL PAIN OR CRAMPING OR DIARRHEA 60 capsule 0   estradiol-norethindrone (ACTIVELLA) 1-0.5 MG tablet TAKE 1 TABLET BY MOUTH  DAILY 84 tablet 3   famotidine (PEPCID) 20 MG tablet TAKE 1 TABLET(20 MG) BY MOUTH TWICE DAILY BEFORE A MEAL 60 tablet 3   GARLIC PO Take 1 tablet by mouth 2 (two) times daily.     ibuprofen (ADVIL) 800 MG tablet TAKE 1/2 TO 1 TABLET BY MOUTH 3 TIMES DAILY (EVERY 8 HOURS) WITH FOOD AS NEEDED FOR PAIN AND INFLAMMATION. TRY TO LIMIT TO 1/2 TABLET AND FREQUENCY TO AVOID PERMANENT KIDNEY DAMAGE 90 tablet 2   levothyroxine (SYNTHROID) 100 MCG tablet TAKE 1 TABLET DAILY ON AN EMPTY STOMACH WITH WATER ONLY FOR 30 MIN AND NO ANTACIDS, CALCIUM, MAGNESIUM FOR 4 HRS. AVOID BIOTIN. 90 tablet 3   lubiprostone (AMITIZA) 8 MCG capsule Take 1 capsule (8 mcg total) by mouth 2 (two) times daily with a meal. 60 capsule 0   metFORMIN (GLUCOPHAGE) 1000 MG tablet TAKE 1 TABLET TWICE DAILY FOR DIABETES 180 tablet 3   olmesartan-hydrochlorothiazide (BENICAR HCT) 40-25 MG tablet TAKE 1 TABLET BY MOUTH  DAILY FOR BLOOD PRESSURE ,  REPLACES LOSARTAN/HCTZ  TAB 90 tablet 3   pantoprazole (PROTONIX) 40 MG tablet Take 1 tablet (40 mg total) by mouth 2 (two) times daily. 90 tablet 3   PEG-KCl-NaCl-NaSulf-Na Asc-C (PLENVU) 140 g SOLR Use as directed. Manufacturer's coupon Universal coupon code:BIN: P2366821; GROUP: [id number]; PCN: [id number]; ID: [id number]; PAY NO MORE $50; NO prior authorization 1 each 0   rifaximin (XIFAXAN) 550 MG TABS tablet Take 1 tablet (550 mg total) by mouth 2 (two) times daily. 28 tablet 0   Semaglutide,  1 MG/DOSE, (OZEMPIC, 1 MG/DOSE,) 4 MG/3ML SOPN Inject 1 mg into the skin once a week. 9 mL 1   temazepam (RESTORIL) 30 MG capsule TAKE 1 CAPSULE BY MOUTH EVERY NIGHT AT BEDTIME AS NEEDED FOR INSOMNIA 30 capsule 0   AMBULATORY NON FORMULARY MEDICATION Medication Name: IB Gard-Use as directed     AMBULATORY NON FORMULARY MEDICATION Medication Name: FD Gard-Use as directed     gabapentin (NEURONTIN) 300 MG capsule Take 1 capsule (300 mg total) by mouth 3 (three) times daily. 90 capsule 2   lactulose (CHRONULAC) 10 GM/15ML solution Take 30 mLs (20 g total) by mouth 2 (two) times daily. 240 mL 0   Rimegepant Sulfate (NURTEC) 75 MG TBDP Take 1 tab under tongue once daily as needed with onset of migraine. 2 tablet 0   valACYclovir (VALTREX) 500 MG tablet TAKE 1 TABLET BY MOUTH  TWICE DAILY AS NEEDED FOR  COLD SORES / FEVER BLISTERS 180 tablet 3   Current Facility-Administered Medications  Medication Dose Route Frequency Provider Last Rate Last Admin   0.9 %  sodium chloride infusion  500 mL Intravenous Continuous Thornton Park, MD        Allergies as of 05/07/2021   (No Known Allergies)    Family History  Problem Relation Age of Onset   Diabetes Mother    Lung cancer Mother    COPD Mother    Diverticulitis Mother    Leukemia Mother    Heart disease Mother    Diabetes Sister    Colon cancer Neg Hx    Breast cancer Neg Hx    Stomach cancer Neg Hx    Pancreatic cancer Neg Hx    Liver disease Neg Hx       Physical Exam: General:   Alert,  well-nourished, pleasant and cooperative in NAD Head:  Normocephalic and atraumatic. Eyes:  Sclera clear, no icterus.   Conjunctiva pink. Ears:  Normal auditory acuity. Nose:  No deformity, discharge,  or lesions. Mouth:  No deformity or lesions.   Neck:  Supple; no masses or thyromegaly. Lungs:  Clear throughout to auscultation.   No wheezes. Heart:  Regular rate and rhythm; no murmurs. Abdomen:  Soft, mild epigastric pain with deep  palpation, central obesity, nondistended, normal bowel sounds, no rebound or guarding. No hepatosplenomegaly.   Rectal:  Deferred  Msk:  Symmetrical. No boney deformities LAD: No inguinal or umbilical LAD Extremities:  No clubbing or edema. Neurologic:  Alert and  oriented x4;  grossly nonfocal Skin:  Intact without significant lesions or rashes. Psych:  Alert and cooperative. Normal mood and affect.   Joanthan Hlavacek L. Tarri Glenn, MD, MPH 05/07/2021, 4:02 PM

## 2021-05-07 NOTE — Progress Notes (Signed)
1622 Robinul 0.1 mg IV given due large amount of secretions upon assessment.  MD made aware, vss

## 2021-05-07 NOTE — Patient Instructions (Signed)
Please read handouts provided. Continue present medications. Await pathology results.   YOU HAD AN ENDOSCOPIC PROCEDURE TODAY AT THE [facility]:   Refer to the procedure report that was given to you for any specific questions about what was found during the examination.  If the procedure report does not answer your questions, please call your gastroenterologist to clarify.  If you requested that your care partner not be given the details of your procedure findings, then the procedure report has been included in a sealed envelope for you to review at your convenience later.  YOU SHOULD EXPECT: Some feelings of bloating in the abdomen. Passage of more gas than usual.  Walking can help get rid of the air that was put into your GI tract during the procedure and reduce the bloating. If you had a lower endoscopy (such as a colonoscopy or flexible sigmoidoscopy) you may notice spotting of blood in your stool or on the toilet paper. If you underwent a bowel prep for your procedure, you may not have a normal bowel movement for a few days.  Please Note:  You might notice some irritation and congestion in your nose or some drainage.  This is from the oxygen used during your procedure.  There is no need for concern and it should clear up in a day or so.  SYMPTOMS TO REPORT IMMEDIATELY:  Following lower endoscopy (colonoscopy or flexible sigmoidoscopy):  Excessive amounts of blood in the stool  Significant tenderness or worsening of abdominal pains  Swelling of the abdomen that is new, acute  Fever of 100F or higher   For urgent or emergent issues, a gastroenterologist can be reached at any hour by calling [phone]. Do not use MyChart messaging for urgent concerns.    DIET:  We do recommend a small meal at first, but then you may proceed to your regular diet.  Drink plenty of fluids but you should avoid alcoholic beverages for 24 hours.  ACTIVITY:  You should plan to take it easy  for the rest of today and you should NOT DRIVE or use heavy machinery until tomorrow (because of the sedation medicines used during the test).    FOLLOW UP: Our staff will call the number listed on your records 48-72 hours following your procedure to check on you and address any questions or concerns that you may have regarding the information given to you following your procedure. If we do not reach you, we will leave a message.  We will attempt to reach you two times.  During this call, we will ask if you have developed any symptoms of COVID 19. If you develop any symptoms (ie: fever, flu-like symptoms, shortness of breath, cough etc.) before then, please call [phone].  If you test positive for Covid 19 in the 2 weeks post procedure, please call and report this information to us.    If any biopsies were taken you will be contacted by phone or by letter within the next 1-3 weeks.  Please call us at [phone] if you have not heard about the biopsies in 3 weeks.    SIGNATURES/CONFIDENTIALITY: You and/or your care partner have signed paperwork which will be entered into your electronic medical record.  These signatures attest to the fact that that the information above on your After Visit Summary has been reviewed and is understood.  Full responsibility of the confidentiality of this discharge information lies with you and/or your care-partner.  

## 2021-05-09 ENCOUNTER — Telehealth: Payer: Self-pay

## 2021-05-09 NOTE — Telephone Encounter (Signed)
°  Follow up Call-  Call back number 05/07/2021 04/01/2021  Post procedure Call Back phone  # [phone]  Permission to leave phone message Yes Yes  Some recent data might be hidden    1st follow up call made.  NALM

## 2021-05-09 NOTE — Telephone Encounter (Signed)
°  Follow up Call-  Call back number 05/07/2021 04/01/2021  Post procedure Call Back phone  # [phone]  Permission to leave phone message Yes Yes  Some recent data might be hidden     Patient questions:  Do you have a fever, pain , or abdominal swelling? No. Pain Score  0 *  Have you tolerated food without any problems? Yes.    Have you been able to return to your normal activities? Yes.    Do you have any questions about your discharge instructions: Diet   No. Medications  No. Follow up visit  No.  Do you have questions or concerns about your Care? No.  Actions: * If pain score is 4 or above: No action needed, pain <4.  Have you developed a fever since your procedure? no  2.   Have you had an respiratory symptoms (SOB or cough) since your procedure? no  3.   Have you tested positive for COVID 19 since your procedure no  4.   Have you had any family members/close contacts diagnosed with the COVID 19 since your procedure?  no   If yes to any of these questions please route to Joylene John, RN and Joella Prince, RN

## 2021-05-10 ENCOUNTER — Encounter: Payer: Self-pay | Admitting: Gastroenterology

## 2021-05-13 ENCOUNTER — Other Ambulatory Visit: Payer: Self-pay | Admitting: Adult Health

## 2021-05-13 ENCOUNTER — Other Ambulatory Visit: Payer: Self-pay | Admitting: Nurse Practitioner

## 2021-05-13 ENCOUNTER — Encounter: Payer: 59 | Admitting: Nurse Practitioner

## 2021-05-13 DIAGNOSIS — E785 Hyperlipidemia, unspecified: Secondary | ICD-10-CM

## 2021-05-13 DIAGNOSIS — E1169 Type 2 diabetes mellitus with other specified complication: Secondary | ICD-10-CM
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
## 2021-05-29 ENCOUNTER — Other Ambulatory Visit: Payer: Self-pay

## 2021-05-29 ENCOUNTER — Ambulatory Visit (INDEPENDENT_AMBULATORY_CARE_PROVIDER_SITE_OTHER): Payer: 59 | Admitting: Nurse Practitioner

## 2021-05-29 ENCOUNTER — Encounter: Payer: Self-pay | Admitting: Nurse Practitioner

## 2021-05-29 ENCOUNTER — Ambulatory Visit
Admission: RE | Admit: 2021-05-29 | Discharge: 2021-05-29 | Disposition: A | Discharge status: Home / Self Care | Payer: 59 | Source: Ambulatory Visit | Attending: Nurse Practitioner | Admitting: Nurse Practitioner

## 2021-05-29 VITALS — BP 122/68 | HR 110 | Temp 96.6°F | Ht 59.25 in | Wt 175.8 lb

## 2021-05-29 DIAGNOSIS — Z0001 Encounter for general adult medical examination with abnormal findings: Secondary | ICD-10-CM

## 2021-05-29 DIAGNOSIS — E782 Mixed hyperlipidemia: Secondary | ICD-10-CM

## 2021-05-29 DIAGNOSIS — M7989 Other specified soft tissue disorders: Secondary | ICD-10-CM

## 2021-05-29 DIAGNOSIS — Z0000 Encounter for general adult medical examination without abnormal findings: Secondary | ICD-10-CM | POA: Diagnosis not present

## 2021-05-29 DIAGNOSIS — R7309 Other abnormal glucose: Secondary | ICD-10-CM

## 2021-05-29 DIAGNOSIS — E559 Vitamin D deficiency, unspecified: Secondary | ICD-10-CM

## 2021-05-29 DIAGNOSIS — Z136 Encounter for screening for cardiovascular disorders: Secondary | ICD-10-CM

## 2021-05-29 DIAGNOSIS — I10 Essential (primary) hypertension: Secondary | ICD-10-CM

## 2021-05-29 DIAGNOSIS — M79672 Pain in left foot: Secondary | ICD-10-CM

## 2021-05-29 DIAGNOSIS — Z79899 Other long term (current) drug therapy: Secondary | ICD-10-CM

## 2021-05-29 DIAGNOSIS — Z1389 Encounter for screening for other disorder: Secondary | ICD-10-CM

## 2021-05-29 DIAGNOSIS — E039 Hypothyroidism, unspecified: Secondary | ICD-10-CM

## 2021-05-29 DIAGNOSIS — G43809 Other migraine, not intractable, without status migrainosus: Secondary | ICD-10-CM

## 2021-05-29 DIAGNOSIS — I700 Atherosclerosis of aorta: Secondary | ICD-10-CM

## 2021-05-29 MED ORDER — OZEMPIC (2 MG/DOSE) 8 MG/3ML ~~LOC~~ SOPN: 2.0000 mg | PEN_INJECTOR | SUBCUTANEOUS | 3 refills | Status: DC

## 2021-05-29 NOTE — Patient Instructions (Signed)
[facility] in for left foot xray   [place] what a healthy weight is for you (roughly BMI <25) and aim to maintain this   Aim for 7+ servings of fruits and vegetables daily   70-80+ fluid ounces of water or unsweet tea for healthy kidneys   Limit to max 1 drink of alcohol per day; avoid smoking/tobacco   Limit animal fats in diet for cholesterol and heart health - choose grass fed whenever available   Avoid highly processed foods, and foods high in saturated/trans fats   Aim for low stress - take time to unwind and care for your mental health   Aim for 150 min of moderate intensity exercise weekly for heart health, and weights twice weekly for bone health   Aim for 7-9 hours of sleep daily

## 2021-05-30 LAB — CBC WITH DIFFERENTIAL/PLATELET
Absolute Monocytes: 828 cells/uL (ref 200–950)
Basophils Absolute: 53 cells/uL (ref 0–200)
Basophils Relative: 0.6 %
Eosinophils Absolute: 214 cells/uL (ref 15–500)
Eosinophils Relative: 2.4 %
HCT: 39.2 % (ref 35.0–45.0)
Hemoglobin: 12.9 g/dL (ref 11.7–15.5)
Lymphs Abs: 2056 cells/uL (ref 850–3900)
MCH: 30.8 pg (ref 27.0–33.0)
MCHC: 32.9 g/dL (ref 32.0–36.0)
MCV: 93.6 fL (ref 80.0–100.0)
MPV: 9.4 fL (ref 7.5–12.5)
Monocytes Relative: 9.3 %
Neutro Abs: 5749 cells/uL (ref 1500–7800)
Neutrophils Relative %: 64.6 %
Platelets: 418 10*3/uL — ABNORMAL HIGH (ref 140–400)
RBC: 4.19 10*6/uL (ref 3.80–5.10)
RDW: 12.6 % (ref 11.0–15.0)
Total Lymphocyte: 23.1 %
WBC: 8.9 10*3/uL (ref 3.8–10.8)

## 2021-05-30 LAB — LIPID PANEL
Cholesterol: 177 mg/dL (ref <200)
HDL: 45 mg/dL — ABNORMAL LOW (ref >=50)
LDL Cholesterol (Calc): 108 mg/dL (calc) — ABNORMAL HIGH
Non-HDL Cholesterol (Calc): 132 mg/dL (calc) — ABNORMAL HIGH (ref <130)
Total CHOL/HDL Ratio: 3.9 (calc) (ref <5.0)
Triglycerides: 128 mg/dL (ref <150)

## 2021-05-30 LAB — COMPLETE METABOLIC PANEL WITH GFR
AG Ratio: 1.6 (calc) (ref 1.0–2.5)
ALT: 13 U/L (ref 6–29)
AST: 15 U/L (ref 10–35)
Albumin: 4.2 g/dL (ref 3.6–5.1)
Alkaline phosphatase (APISO): 57 U/L (ref 37–153)
BUN: 15 mg/dL (ref 7–25)
CO2: 27 mmol/L (ref 20–32)
Calcium: 9.8 mg/dL (ref 8.6–10.4)
Chloride: 104 mmol/L (ref 98–110)
Creat: 0.79 mg/dL (ref 0.50–1.05)
Globulin: 2.7 g/dL (calc) (ref 1.9–3.7)
Glucose, Bld: 79 mg/dL (ref 65–99)
Potassium: 4.1 mmol/L (ref 3.5–5.3)
Sodium: 140 mmol/L (ref 135–146)
Total Bilirubin: 0.3 mg/dL (ref 0.2–1.2)
Total Protein: 6.9 g/dL (ref 6.1–8.1)
eGFR: 86 mL/min/{1.73_m2} (ref >=60)

## 2021-05-30 LAB — HEMOGLOBIN A1C
Hgb A1c MFr Bld: 5.1 % of total Hgb (ref <5.7)
Mean Plasma Glucose: 100 mg/dL
eAG (mmol/L): 5.5 mmol/L

## 2021-05-30 LAB — URINALYSIS, ROUTINE W REFLEX MICROSCOPIC
Bilirubin Urine: NEGATIVE
Glucose, UA: NEGATIVE
Hgb urine dipstick: NEGATIVE
Ketones, ur: NEGATIVE
Leukocytes,Ua: NEGATIVE
Nitrite: NEGATIVE
Protein, ur: NEGATIVE
Specific Gravity, Urine: 1.024 (ref 1.001–1.035)
pH: 6.5 (ref 5.0–8.0)

## 2021-05-30 LAB — MICROALBUMIN / CREATININE URINE RATIO
Creatinine, Urine: 171 mg/dL (ref 20–275)
Microalb Creat Ratio: 5 mcg/mg creat (ref <30)
Microalb, Ur: 0.9 mg/dL

## 2021-05-30 LAB — MAGNESIUM: Magnesium: 1.9 mg/dL (ref 1.5–2.5)

## 2021-05-30 LAB — TSH: TSH: 1.14 mIU/L (ref 0.40–4.50)

## 2021-05-30 LAB — VITAMIN D 25 HYDROXY (VIT D DEFICIENCY, FRACTURES): Vit D, 25-Hydroxy: 81 ng/mL (ref 30–100)

## 2021-06-12 ENCOUNTER — Other Ambulatory Visit: Payer: Self-pay | Admitting: Nurse Practitioner

## 2021-07-12 ENCOUNTER — Other Ambulatory Visit: Payer: Self-pay | Admitting: Nurse Practitioner

## 2021-07-26 ENCOUNTER — Other Ambulatory Visit: Payer: Self-pay | Admitting: Adult Health

## 2021-07-26 DIAGNOSIS — I10 Essential (primary) hypertension: Secondary | ICD-10-CM

## 2021-08-16 ENCOUNTER — Other Ambulatory Visit: Payer: Self-pay | Admitting: Nurse Practitioner

## 2021-08-29 ENCOUNTER — Other Ambulatory Visit: Payer: Self-pay | Admitting: Obstetrics and Gynecology

## 2021-08-29 DIAGNOSIS — Z1231 Encounter for screening mammogram for malignant neoplasm of breast: Secondary | ICD-10-CM

## 2021-09-06 ENCOUNTER — Ambulatory Visit
Admission: RE | Admit: 2021-09-06 | Discharge: 2021-09-06 | Disposition: A | Discharge status: Home / Self Care | Payer: 59 | Source: Ambulatory Visit | Attending: Obstetrics and Gynecology | Admitting: Obstetrics and Gynecology

## 2021-09-06 DIAGNOSIS — Z1231 Encounter for screening mammogram for malignant neoplasm of breast: Secondary | ICD-10-CM

## 2021-09-09 NOTE — Progress Notes (Signed)
Assessment and Plan: ? ?Kelly Goodwin was seen today for acute visit. ? ?Diagnoses and all orders for this visit: ? ?Heel spur, left ?Wear supportive shoes, complete course of prednisone- refer to ortho for evalaution ?-     predniSONE (DELTASONE) 20 MG tablet; 3 tablets daily with food for 3 days, 2 tabs daily for 3 days, 1 tab a day for 5 days. ?-     Ambulatory referral to Orthopedics ? ?Obesity (BMI 30.0-34.9) ?Currently on Ozempic for abnormal glucose- has not lost any weight ?Previously took a weight loss medication but uncertain of name- will check at home and advise the office ?Focus on at least 20 minutes of walking daily ?Limit simple carbs such as white bread, white rice, pasta, potatoes, cereal, crackers, sugar and alcohol ? ?Essential hypertension ?- continue medications, DASH diet, exercise and monitor at home. Call if greater than 130/80.   ?  ? ? ? ?Further disposition pending results of labs. Discussed med's effects and SE's.   ?Over 30 minutes of exam, counseling, chart review, and critical decision making was performed.  ? ?Future Appointments  ?Date Time Provider [place]  ?12/03/2021  4:00 PM Magda Bernheim, NP [facility] None  ?05/29/2022  2:00 PM Magda Bernheim, NP [facility] None  ? ? ?------------------------------------------------------------------------------------------------------------------ ? ? ?HPI ?BP 130/68   Pulse (!) 101   Temp (!) 97.3 ?F (36.3 ?C)   Wt 177 lb 12.8 oz (80.6 kg)   SpO2 97%   BMI 35.61 kg/m?  ? ?61 y.o.female presents for left foot pain. She had an xray of left foot 05/29/21 and showed interphalangeal joint arthritis and Small calcaneal heel spur. She continues to have pain in left heel as well as swelling on top of foot.  ? ?BMI is Body mass index is 35.61 kg/m?., she has been working on diet and exercise. She is walking 2-3 miles a day and is watching her diet and limiting carbs. ?Wt Readings from Last 3 Encounters:  ?09/10/21 177 lb 12.8 oz (80.6 kg)  ?05/29/21 175  lb 12.8 oz (79.7 kg)  ?05/07/21 177 lb (80.3 kg)  ? ?She is currently on Olmesartan for her blood pressure and is well controlled.  She denies headaches, chest pain, shortness of breath and dizziness.  ?BP Readings from Last 3 Encounters:  ?09/10/21 130/68  ?05/29/21 122/68  ?05/07/21 116/61  ? ? ? ? ?Past Medical History:  ?Diagnosis Date  ? Arthritis   ? Constipation, chronic   ? Diverticulitis   ? Diverticulosis   ? Fibroid uterus   ? History of gallstones   ? Hyperlipidemia   ? Hypertension   ? Hypothyroidism   ? Migraine   ? PCOS (polycystic ovarian syndrome)   ? takes metformin for this  ? Prediabetes   ? Thyroid disease   ?  ? ?No Known Allergies ? ?Current Outpatient Medications on File Prior to Visit  ?Medication Sig  ? dicyclomine (BENTYL) 10 MG capsule TAKE 1 CAPSULE BY MOUTH THREE TIMES DAILY AS NEEDED FOR ABDOMINAL PAIN OR CRAMPING OR DIARRHEA  ? estradiol-norethindrone (ACTIVELLA) 1-0.5 MG tablet TAKE 1 TABLET BY MOUTH  DAILY  ? famotidine (PEPCID) 20 MG tablet TAKE 1 TABLET(20 MG) BY MOUTH TWICE DAILY BEFORE A MEAL  ? fluconazole (DIFLUCAN) 150 MG tablet TAKE 1 TABLET(150 MG) BY MOUTH 1 TIME FOR 1 DOSE  ? gabapentin (NEURONTIN) 300 MG capsule Take 1 capsule (300 mg total) by mouth 3 (three) times daily.  ? GARLIC PO Take 1 tablet  by mouth 2 (two) times daily.  ? ibuprofen (ADVIL) 800 MG tablet TAKE 1/2 TO 1 TABLET BY MOUTH 3 TIMES DAILY (EVERY 8 HOURS) WITH FOOD AS NEEDED FOR PAIN AND INFLAMMATION. TRY TO LIMIT TO 1/2 TABLET AND FREQUENCY TO AVOID PERMANENT KIDNEY DAMAGE  ? levothyroxine (SYNTHROID) 100 MCG tablet TAKE 1 TABLET DAILY ON AN EMPTY STOMACH WITH WATER ONLY FOR 30 MIN AND NO ANTACIDS, CALCIUM, MAGNESIUM FOR 4 HRS. AVOID BIOTIN.  ? metFORMIN (GLUCOPHAGE) 1000 MG tablet TAKE 1 TABLET TWICE DAILY FOR DIABETES  ? olmesartan-hydrochlorothiazide (BENICAR HCT) 40-25 MG tablet TAKE 1 TABLET BY MOUTH  DAILY FOR BLOOD PRESSURE ,  REPLACES LOSARTAN/HCTZ  TABLET  ? pantoprazole (PROTONIX) 40 MG tablet  Take 1 tablet (40 mg total) by mouth 2 (two) times daily.  ? PEG-KCl-NaCl-NaSulf-Na Asc-C (PLENVU) 140 g SOLR Use as directed. Manufacturer's coupon Universal coupon code:BIN: P2366821; GROUP: [id number]; PCN: [id number]; ID: [id number]; PAY NO MORE $50; NO prior authorization  ? rifaximin (XIFAXAN) 550 MG TABS tablet Take 1 tablet (550 mg total) by mouth 2 (two) times daily.  ? Rimegepant Sulfate (NURTEC) 75 MG TBDP Take 1 tab under tongue once daily as needed with onset of migraine.  ? Semaglutide, 2 MG/DOSE, (OZEMPIC, 2 MG/DOSE,) 8 MG/3ML SOPN Inject 2 mg into the skin once a week.  ? temazepam (RESTORIL) 30 MG capsule TAKE 1 CAPSULE BY MOUTH EVERY NIGHT AT BEDTIME AS NEEDED FOR INSOMNIA.  ? valACYclovir (VALTREX) 500 MG tablet TAKE 1 TABLET BY MOUTH  TWICE DAILY AS NEEDED FOR  COLD SORES / FEVER BLISTERS  ? ?No current facility-administered medications on file prior to visit.  ? ? ?ROS: all negative except above.  ? ?Physical Exam: ? ?BP 130/68   Pulse (!) 101   Temp (!) 97.3 ?F (36.3 ?C)   Wt 177 lb 12.8 oz (80.6 kg)   SpO2 97%   BMI 35.61 kg/m?  ? ?General Appearance: Well nourished, in no apparent distress. ?Eyes: PERRLA, EOMs, conjunctiva no swelling or erythema ?Sinuses: No Frontal/maxillary tenderness ?ENT/Mouth: Ext aud canals clear, TMs without erythema, bulging. No erythema, swelling, or exudate on post pharynx.  Tonsils not swollen or erythematous. Hearing normal.  ?Neck: Supple, thyroid normal.  ?Respiratory: Respiratory effort normal, BS equal bilaterally without rales, rhonchi, wheezing or stridor.  ?Cardio: RRR with no MRGs. Brisk peripheral pulses . 1+ nonpitting edema of left foot  ?Abdomen: Soft, + BS.  Non tender, no guarding, rebound, hernias, masses. ?Lymphatics: Non tender without lymphadenopathy.  ?Musculoskeletal: Full ROM, 5/5 strength, normal gait. Tenderness of left calcaneus with palpation ?Skin: Warm, dry without rashes, lesions, ecchymosis.  ?Neuro: Cranial nerves intact. Normal muscle  tone, no cerebellar symptoms. Sensation intact.  ?Psych: Awake and oriented X 3, normal affect, Insight and Judgment appropriate.  ?  ? ?Magda Bernheim, NP ?4:04 PM ?[facility] ? ?

## 2021-09-10 ENCOUNTER — Encounter: Payer: Self-pay | Admitting: Nurse Practitioner

## 2021-09-10 ENCOUNTER — Other Ambulatory Visit: Payer: Self-pay | Admitting: Obstetrics and Gynecology

## 2021-09-10 ENCOUNTER — Ambulatory Visit (INDEPENDENT_AMBULATORY_CARE_PROVIDER_SITE_OTHER): Payer: 59 | Admitting: Nurse Practitioner

## 2021-09-10 VITALS — BP 130/68 | HR 101 | Temp 97.3°F | Wt 177.8 lb

## 2021-09-10 DIAGNOSIS — M7732 Calcaneal spur, left foot: Secondary | ICD-10-CM | POA: Diagnosis not present

## 2021-09-10 DIAGNOSIS — I10 Essential (primary) hypertension: Secondary | ICD-10-CM

## 2021-09-10 DIAGNOSIS — E669 Obesity, unspecified: Secondary | ICD-10-CM | POA: Diagnosis not present

## 2021-09-10 DIAGNOSIS — R928 Other abnormal and inconclusive findings on diagnostic imaging of breast: Secondary | ICD-10-CM

## 2021-09-10 MED ORDER — PREDNISONE 20 MG PO TABS: ORAL_TABLET | ORAL | 0 refills | Status: AC

## 2021-09-17 ENCOUNTER — Other Ambulatory Visit: Payer: Self-pay | Admitting: Nurse Practitioner

## 2021-09-18 ENCOUNTER — Other Ambulatory Visit: Payer: Self-pay | Admitting: Nurse Practitioner

## 2021-09-18 ENCOUNTER — Other Ambulatory Visit: Payer: Self-pay | Admitting: Obstetrics and Gynecology

## 2021-09-18 DIAGNOSIS — R928 Other abnormal and inconclusive findings on diagnostic imaging of breast: Secondary | ICD-10-CM

## 2021-09-19 ENCOUNTER — Other Ambulatory Visit: Payer: 59

## 2021-09-19 ENCOUNTER — Other Ambulatory Visit: Payer: Self-pay | Admitting: Obstetrics and Gynecology

## 2021-09-19 ENCOUNTER — Ambulatory Visit
Admission: RE | Admit: 2021-09-19 | Discharge: 2021-09-19 | Disposition: A | Discharge status: Home / Self Care | Payer: 59 | Source: Ambulatory Visit | Attending: Nurse Practitioner | Admitting: Nurse Practitioner

## 2021-09-19 ENCOUNTER — Ambulatory Visit (INDEPENDENT_AMBULATORY_CARE_PROVIDER_SITE_OTHER): Payer: 59 | Admitting: Orthopedic Surgery

## 2021-09-19 DIAGNOSIS — M25572 Pain in left ankle and joints of left foot: Secondary | ICD-10-CM | POA: Diagnosis not present

## 2021-09-19 DIAGNOSIS — N6310 Unspecified lump in the right breast, unspecified quadrant: Secondary | ICD-10-CM

## 2021-09-19 DIAGNOSIS — R928 Other abnormal and inconclusive findings on diagnostic imaging of breast: Secondary | ICD-10-CM

## 2021-09-24 ENCOUNTER — Other Ambulatory Visit: Payer: Self-pay | Admitting: Obstetrics and Gynecology

## 2021-09-24 DIAGNOSIS — E2839 Other primary ovarian failure: Secondary | ICD-10-CM

## 2021-09-26 ENCOUNTER — Ambulatory Visit
Admission: RE | Admit: 2021-09-26 | Discharge: 2021-09-26 | Disposition: A | Discharge status: Home / Self Care | Payer: 59 | Source: Ambulatory Visit | Attending: Obstetrics and Gynecology | Admitting: Obstetrics and Gynecology

## 2021-09-26 ENCOUNTER — Encounter: Payer: Self-pay | Admitting: Orthopedic Surgery

## 2021-09-26 DIAGNOSIS — N6310 Unspecified lump in the right breast, unspecified quadrant: Secondary | ICD-10-CM
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
## 2021-10-02 ENCOUNTER — Other Ambulatory Visit: Payer: Self-pay | Admitting: Nurse Practitioner

## 2021-10-02 DIAGNOSIS — R7309 Other abnormal glucose: Secondary | ICD-10-CM

## 2021-10-04 ENCOUNTER — Other Ambulatory Visit: Payer: Self-pay | Admitting: Gastroenterology

## 2021-10-04 ENCOUNTER — Other Ambulatory Visit: Payer: Self-pay | Admitting: Physician Assistant

## 2021-10-04 DIAGNOSIS — R1010 Upper abdominal pain, unspecified: Secondary | ICD-10-CM

## 2021-10-04 DIAGNOSIS — K209 Esophagitis, unspecified without bleeding: Secondary | ICD-10-CM

## 2021-10-04 DIAGNOSIS — R110 Nausea: Secondary | ICD-10-CM

## 2021-10-04 DIAGNOSIS — K2970 Gastritis, unspecified, without bleeding: Secondary | ICD-10-CM

## 2021-10-18 ENCOUNTER — Other Ambulatory Visit: Payer: Self-pay | Admitting: Adult Health

## 2021-11-08 ENCOUNTER — Other Ambulatory Visit: Payer: Self-pay | Admitting: Adult Health

## 2021-11-15 ENCOUNTER — Encounter: Payer: Self-pay | Admitting: Nurse Practitioner

## 2021-11-16 ENCOUNTER — Other Ambulatory Visit: Payer: Self-pay | Admitting: Nurse Practitioner

## 2021-11-16 DIAGNOSIS — F909 Attention-deficit hyperactivity disorder, unspecified type: Secondary | ICD-10-CM

## 2021-11-16 MED ORDER — LISDEXAMFETAMINE DIMESYLATE 30 MG PO CAPS: 30.0000 mg | ORAL_CAPSULE | Freq: Every day | ORAL | 0 refills | Status: DC

## 2021-11-20 ENCOUNTER — Other Ambulatory Visit: Payer: Self-pay

## 2021-11-20 MED ORDER — TEMAZEPAM 30 MG PO CAPS: ORAL_CAPSULE | ORAL | 0 refills | Status: DC

## 2021-11-25 ENCOUNTER — Other Ambulatory Visit: Payer: Self-pay | Admitting: Nurse Practitioner

## 2021-11-25 ENCOUNTER — Telehealth: Payer: Self-pay

## 2021-11-25 MED ORDER — TEMAZEPAM 30 MG PO CAPS
ORAL_CAPSULE | ORAL | 0 refills | Status: DC
Start: 2021-11-25 — End: 2021-11-26

## 2021-11-25 NOTE — Progress Notes (Signed)
PDMP is reviewed and appropriate  

## 2021-11-25 NOTE — Telephone Encounter (Signed)
Refill request for Temazepam.

## 2021-11-26 ENCOUNTER — Other Ambulatory Visit: Payer: Self-pay | Admitting: Nurse Practitioner

## 2021-11-26 MED ORDER — TEMAZEPAM 30 MG PO CAPS
ORAL_CAPSULE | ORAL | 0 refills | Status: DC
Start: 2021-11-26 — End: 2021-12-23

## 2021-11-26 NOTE — Telephone Encounter (Signed)
I sent the refill in to [facility]

## 2021-12-03 ENCOUNTER — Ambulatory Visit: Payer: 59 | Admitting: Nurse Practitioner

## 2021-12-20 NOTE — Progress Notes (Unsigned)
3 MONTH FOLLOW UP   Assessment and Plan:   Aortic atherosclerosis ([place]) Per CT Abdomen 09/2019 Control blood pressure, cholesterol, glucose, increase exercise.   Essential hypertension - continue medications, DASH diet, exercise and monitor at home. Call if greater than 130/80.  -     CBC with Diff -     COMPLETE METABOLIC PANEL WITH GFR  Mixed hyperlipidemia decrease fatty foods (saturated fat) increase fiber increase activity. -     Lipid Profile  PCOS/other abnormal glucose (prediabetes) Discussed disease and risks Discussed diet/exercise, weight management  Check A1C q39m defer this visit - patient has lost weight, now in normal range, will continue to monitor  Hypothyroidism, unspecified type Continue current medications Reminder to take on an empty stomach 30-663ms before first meal of the day. No antacid medications for 4 hours. -     TSH  CKD stage 2 (Mild) Increase fluids, avoid NSAIDS, monitor sugars, will monitor  -CMP  Obesity - BMI 33 with comorbidities Has + binge eating; vyvanse- does give dry mouth.  Will try taking every other day and use biotene moisturizing products Has tried and failed phentermine, contrave, qysema, belviq, can not afford saxenda, topamax had AE's.  Improved with vyvanse and ozempic Continue to increase activity and decrease saturated fats and simple carbs  Medication management -     Magnesium  Vitamin D deficiency Continue Vit D supplementation  Insomnia Continue Temazepam nightly as needed Practice good sleep hygiene  Mosquito bite reaction Try Triamcinolone ointment to affected areas  Discussed med's effects and SE's.  Further disposition pending results if labs check today. Discussed med's effects and SE's.   Over 30 minutes of face to face interview, exam, counseling, chart review, and critical decision making was performed.   Future Appointments  Date Time Provider [place]/21/2023  4:00 PM [facility] DX  DEXA 1 [facility]  05/29/2022  2:00 PM WiAlycia RossettiNP [facility] None      HPI  [age].o. female  presents for 6 month follow up on htn, hyperlipidemia, PCOS/anormal glucose, obesity, vitamin D.    She has IBS C/D, hx of diverticulosis, prone to constipation. She is currently on Rifaximin, had run out and is no longer taking,  She uses laxatives, miralax daily and tea.   She does use Vyvanse 30 mg daily for binge eating .  It is giving her very dry mouth.   Left foot remains swollen, was previously seen by Dr. DuSharol GivenShe needs to make a follow up appointment.  She has been getting mosquito bites that will turn into sores and scar. They will sting and burn.   BMI is Body mass index is 33.65 kg/m., she has been working on diet, trying to make better choices . Limited benefit with phentermine, belvia, qysima, topiramate (SE), contrave, cost barrier with saxenda. Some benefit with vyvanse for binge eating behaviors but not currently taking. Currently on Ozempic 2 mg SQ QW Wt Readings from Last 3 Encounters:  12/23/21 168 lb (76.2 kg)  09/10/21 177 lb 12.8 oz (80.6 kg)  05/29/21 175 lb 12.8 oz (79.7 kg)   Her blood pressure has been controlled at home, today their BP is BP: (!) 140/78. She is walking 2-3 miles 4 times a week and doing weight twice a week.  BP Readings from Last 3 Encounters:  12/23/21 (!) 140/78  09/10/21 130/68  05/29/21 122/68   She does not workout. She denies chest pain, shortness of breath, dizziness.  She is not on cholesterol medication and denies myalgias. Her cholesterol is at goal. The cholesterol last visit was:  Lab Results  Component Value Date   CHOL 177 05/29/2021   HDL 45 (L) 05/29/2021   LDLCALC 108 (H) 05/29/2021   TRIG 128 05/29/2021   CHOLHDL 3.9 05/29/2021  . She has been working on diet and exercise for PCOS, was on metoformin 2000 mg since her 20's, and on ozempic 2 mg SQ QW she is on bASA, she is on ACE/ARB and denies foot  ulcerations, hyperglycemia, hypoglycemia , increased appetite, nausea, paresthesia of the feet, polydipsia, polyuria, visual disturbances, vomiting and weight loss. Last A1C in the office was:  Lab Results  Component Value Date   HGBA1C 5.1 05/29/2021   Patient is on Vitamin D supplement, reports was taking high dose supplement previous, now taking calcium+ D3 supplement only, ? 600 IU Lab Results  Component Value Date   VD25OH 66 05/29/2021     She is on thyroid medication. Her medication was not changed last visit.  She tried armour thyroid but states she was too tired. She is alternating 100 mcg and 50 mcg every other day, hasn't changed dose recently.  Lab Results  Component Value Date   TSH 1.14 05/29/2021     Current Medications:   Current Outpatient Medications (Endocrine & Metabolic):    estradiol-norethindrone (ACTIVELLA) 1-0.5 MG tablet, TAKE 1 TABLET BY MOUTH  DAILY   levothyroxine (SYNTHROID) 100 MCG tablet, TAKE 1 TABLET DAILY ON AN  EMPTY STOMACH WITH WATER  ONLY FOR 30 MIN AND NO  ANTACIDS, CALCIUM,  MAGNESIUM FOR 4 HRS. AVOID  BIOTIN.   metFORMIN (GLUCOPHAGE) 1000 MG tablet, TAKE 1 TABLET TWICE DAILY FOR DIABETES   OZEMPIC, 2 MG/DOSE, 8 MG/3ML SOPN, INJECT 2 MG INTO THE SKIN ONCE A WEEK  Current Outpatient Medications (Cardiovascular):    olmesartan-hydrochlorothiazide (BENICAR HCT) 40-25 MG tablet, TAKE 1 TABLET BY MOUTH  DAILY FOR BLOOD PRESSURE ,  REPLACES LOSARTAN/HCTZ  TABLET   Current Outpatient Medications (Analgesics):    ibuprofen (ADVIL) 800 MG tablet, TAKE 1/2 TO 1 TABLET BY MOUTH 3 TIMES DAILY (EVERY 8 HOURS) WITH FOOD AS NEEDED FOR PAIN AND INFLAMMATION. TRY TO LIMIT TO 1/2 TABLET AND FREQUENCY TO AVOID PERMANENT KIDNEY DAMAGE   Rimegepant Sulfate (NURTEC) 75 MG TBDP, Take 1 tab under tongue once daily as needed with onset of migraine.   Current Outpatient Medications (Other):    COLLAGEN PO, Take by mouth.   dicyclomine (BENTYL) 10 MG capsule, TAKE 1  CAPSULE BY MOUTH THREE TIMES DAILY AS NEEDED FOR ABDOMINAL PAIN OR CRAMPING OR DIARRHEA   famotidine (PEPCID) 20 MG tablet, TAKE 1 TABLET(20 MG) BY MOUTH TWICE DAILY BEFORE A MEAL   fluconazole (DIFLUCAN) 150 MG tablet, TAKE 1 TABLET(150 MG) BY MOUTH 1 TIME FOR 1 DOSE   gabapentin (NEURONTIN) 300 MG capsule, Take 1 capsule (300 mg total) by mouth 3 (three) times daily. (Patient not taking: Reported on 11/07/4578)   GARLIC PO, Take 1 tablet by mouth 2 (two) times daily.   lisdexamfetamine (VYVANSE) 30 MG capsule, Take 1 capsule (30 mg total) by mouth daily.   Multiple Vitamins-Minerals (MULTIPLE VITAMINS/WOMENS PO), Take by mouth.   OVER THE COUNTER MEDICATION, Sea moss   pantoprazole (PROTONIX) 40 MG tablet, TAKE 1 TABLET(40 MG) BY MOUTH TWICE DAILY   PEG-KCl-NaCl-NaSulf-Na Asc-C (PLENVU) 140 g SOLR, Use as directed. Manufacturer's coupon Universal coupon code:BIN: P2366821; GROUP: [id number]; PCN: [id number]; ID: [id number]; PAY NO  MORE $50; NO prior authorization   rifaximin (XIFAXAN) 550 MG TABS tablet, Take 1 tablet (550 mg total) by mouth 2 (two) times daily.   temazepam (RESTORIL) 30 MG capsule, TAKE 1 CAPSULE BY MOUTH EVERY NIGHT AT BEDTIME AS NEEDED FOR INSOMNIA   valACYclovir (VALTREX) 500 MG tablet, TAKE 1 TABLET BY MOUTH  TWICE DAILY AS NEEDED FOR  COLD SORES / FEVER BLISTERS  Medical History:  Past Medical History:  Diagnosis Date   Arthritis    Constipation, chronic    Diverticulitis    Diverticulosis    Fibroid uterus    History of gallstones    Hyperlipidemia    Hypertension    Hypothyroidism    Migraine    PCOS (polycystic ovarian syndrome)    takes metformin for this   Prediabetes    Thyroid disease    Allergies No Known Allergies  SURGICAL HISTORY She  has a past surgical history that includes Breast reduction surgery (1988); Cholecystectomy (1995); Tonsillectomy and adenoidectomy (1990's); LASIK (Left); Refractive surgery (Left); Breast surgery; and Colonoscopy. FAMILY  HISTORY Her family history includes COPD in her mother; Diabetes in her mother and sister; Diverticulitis in her mother; Heart disease in her mother; Leukemia in her mother; Lung cancer in her mother. SOCIAL HISTORY She  reports that she has never smoked. She has never used smokeless tobacco. She reports current alcohol use. She reports that she does not use drugs.   Review of Systems: Review of Systems  Constitutional:  Negative for malaise/fatigue and weight loss.  HENT:  Negative for congestion, hearing loss, sore throat and tinnitus.        Dry mouth with Vyvanse  Eyes:  Negative for blurred vision and double vision.  Respiratory:  Negative for cough, shortness of breath and wheezing.   Cardiovascular:  Negative for chest pain, palpitations, orthopnea, claudication and leg swelling.  Gastrointestinal:  Positive for constipation. Negative for abdominal pain, blood in stool, diarrhea, heartburn, melena, nausea and vomiting.  Genitourinary: Negative.   Musculoskeletal:  Positive for joint pain (left foot). Negative for myalgias.  Skin:  Negative for rash.       Mosquito bite reaction  Neurological:  Negative for dizziness, tingling, sensory change, weakness and headaches.  Endo/Heme/Allergies:  Negative for polydipsia.  Psychiatric/Behavioral: Negative.  Negative for depression and substance abuse. The patient is not nervous/anxious.   All other systems reviewed and are negative.   Physical Exam: Estimated body mass index is 33.65 kg/m as calculated from the following:   Height as of this encounter: 4' 11.25" (1.505 m).   Weight as of this encounter: 168 lb (76.2 kg). BP (!) 140/78   Pulse 78   Temp (!) 97.5 F (36.4 C)   Ht 4' 11.25" (1.505 m)   Wt 168 lb (76.2 kg)   SpO2 98%   BMI 33.65 kg/m   General Appearance: Well nourished well developed, in no apparent distress.  Eyes: PERRLA, EOMs, conjunctiva no swelling or erythema ENT/Mouth: Ear canals normal without  obstruction, swelling, erythema, or discharge.  TMs normal bilaterally with no erythema, bulging, retraction, or loss of landmark.  Oropharynx moist and clear with no exudate, erythema, or swelling.   Neck: Supple, thyroid nodule left side. No bruits.  No cervical adenopathy Respiratory: Respiratory effort normal, Breath sounds clear A&P without wheeze, rhonchi, rales.   Cardio: RRR without murmurs, rubs or gallops. Brisk peripheral pulses without edema.  Chest: symmetric, with normal excursions Abdomen: Soft, obese, non-distended, vague discomfort with LLQ palpation,  no guarding, rebound, hernias, palpable masses, or organomegaly.  Lymphatics: Non tender without lymphadenopathy.  Musculoskeletal: Full ROM all peripheral extremities,5/5 strength, and normal gait. Skin: Warm, dry . 2-3 small blackened areas where she has been bitten by mosquitoes Neuro: Awake and oriented X 3, Cranial nerves intact, reflexes equal bilaterally. Normal muscle tone, no cerebellar symptoms. Sensation intact.  Psych:  normal affect, Insight and Judgment appropriate.   Alycia Rossetti, NP 4:22 PM [facility]

## 2021-12-23 ENCOUNTER — Encounter: Payer: Self-pay | Admitting: Nurse Practitioner

## 2021-12-23 ENCOUNTER — Ambulatory Visit (INDEPENDENT_AMBULATORY_CARE_PROVIDER_SITE_OTHER): Payer: 59 | Admitting: Nurse Practitioner

## 2021-12-23 VITALS — BP 140/78 | HR 78 | Temp 97.5°F | Ht 59.25 in | Wt 168.0 lb

## 2021-12-23 DIAGNOSIS — G4700 Insomnia, unspecified: Secondary | ICD-10-CM

## 2021-12-23 DIAGNOSIS — W57XXXA Bitten or stung by nonvenomous insect and other nonvenomous arthropods, initial encounter: Secondary | ICD-10-CM

## 2021-12-23 DIAGNOSIS — I10 Essential (primary) hypertension: Secondary | ICD-10-CM | POA: Diagnosis not present

## 2021-12-23 DIAGNOSIS — I700 Atherosclerosis of aorta: Secondary | ICD-10-CM | POA: Diagnosis not present

## 2021-12-23 DIAGNOSIS — R7309 Other abnormal glucose: Secondary | ICD-10-CM

## 2021-12-23 DIAGNOSIS — N182 Chronic kidney disease, stage 2 (mild): Secondary | ICD-10-CM

## 2021-12-23 DIAGNOSIS — Z79899 Other long term (current) drug therapy: Secondary | ICD-10-CM

## 2021-12-23 DIAGNOSIS — E782 Mixed hyperlipidemia: Secondary | ICD-10-CM

## 2021-12-23 DIAGNOSIS — E559 Vitamin D deficiency, unspecified: Secondary | ICD-10-CM

## 2021-12-23 DIAGNOSIS — E039 Hypothyroidism, unspecified: Secondary | ICD-10-CM

## 2021-12-23 DIAGNOSIS — E669 Obesity, unspecified: Secondary | ICD-10-CM

## 2021-12-23 MED ORDER — TRIAMCINOLONE ACETONIDE 0.1 % EX CREA: 1.0000 | TOPICAL_CREAM | Freq: Two times a day (BID) | CUTANEOUS | 0 refills | Status: AC

## 2021-12-23 MED ORDER — TEMAZEPAM 30 MG PO CAPS: ORAL_CAPSULE | ORAL | 0 refills | Status: DC

## 2021-12-23 NOTE — Patient Instructions (Signed)
DR. Beverely Low DUDA [phone]  Fair life protein shakes Eat more frequently - try not to go more than 6 hours without protein Aim for 90 grams of protein a day- 30 breakfast/30 lunch 30 dinner Try to keep net carbs less than 50 Net Carbs=Total Carbs-fiber- sugar alcohols Exercise heartrate 120-140(fat burning zone)- walking 20-30 minutes 4 days a week

## 2021-12-24 LAB — COMPLETE METABOLIC PANEL WITH GFR
AG Ratio: 1.6 (calc) (ref 1.0–2.5)
ALT: 13 U/L (ref 6–29)
AST: 10 U/L (ref 10–35)
Albumin: 4.1 g/dL (ref 3.6–5.1)
Alkaline phosphatase (APISO): 49 U/L (ref 37–153)
BUN: 13 mg/dL (ref 7–25)
CO2: 27 mmol/L (ref 20–32)
Calcium: 10.2 mg/dL (ref 8.6–10.4)
Chloride: 103 mmol/L (ref 98–110)
Creat: 0.64 mg/dL (ref 0.50–1.05)
Globulin: 2.6 g/dL (calc) (ref 1.9–3.7)
Glucose, Bld: 81 mg/dL (ref 65–99)
Potassium: 4.1 mmol/L (ref 3.5–5.3)
Sodium: 139 mmol/L (ref 135–146)
Total Bilirubin: 0.3 mg/dL (ref 0.2–1.2)
Total Protein: 6.7 g/dL (ref 6.1–8.1)
eGFR: 100 mL/min/{1.73_m2} (ref >=60)

## 2021-12-24 LAB — LIPID PANEL
Cholesterol: 184 mg/dL (ref <200)
HDL: 47 mg/dL — ABNORMAL LOW (ref >=50)
LDL Cholesterol (Calc): 117 mg/dL (calc) — ABNORMAL HIGH
Non-HDL Cholesterol (Calc): 137 mg/dL (calc) — ABNORMAL HIGH (ref <130)
Total CHOL/HDL Ratio: 3.9 (calc) (ref <5.0)
Triglycerides: 95 mg/dL (ref <150)

## 2021-12-24 LAB — CBC WITH DIFFERENTIAL/PLATELET
Absolute Monocytes: 638 cells/uL (ref 200–950)
Basophils Absolute: 53 cells/uL (ref 0–200)
Basophils Relative: 0.7 %
Eosinophils Absolute: 263 cells/uL (ref 15–500)
Eosinophils Relative: 3.5 %
HCT: 37.3 % (ref 35.0–45.0)
Hemoglobin: 12.4 g/dL (ref 11.7–15.5)
Lymphs Abs: 2318 cells/uL (ref 850–3900)
MCH: 29.2 pg (ref 27.0–33.0)
MCHC: 33.2 g/dL (ref 32.0–36.0)
MCV: 88 fL (ref 80.0–100.0)
MPV: 9.4 fL (ref 7.5–12.5)
Monocytes Relative: 8.5 %
Neutro Abs: 4230 cells/uL (ref 1500–7800)
Neutrophils Relative %: 56.4 %
Platelets: 353 10*3/uL (ref 140–400)
RBC: 4.24 10*6/uL (ref 3.80–5.10)
RDW: 12.9 % (ref 11.0–15.0)
Total Lymphocyte: 30.9 %
WBC: 7.5 10*3/uL (ref 3.8–10.8)

## 2021-12-24 LAB — TSH: TSH: 1.05 mIU/L (ref 0.40–4.50)

## 2021-12-26 ENCOUNTER — Other Ambulatory Visit: Payer: Self-pay | Admitting: Physician Assistant

## 2021-12-26 ENCOUNTER — Other Ambulatory Visit: Payer: Self-pay

## 2021-12-26 DIAGNOSIS — R7303 Prediabetes: Secondary | ICD-10-CM

## 2021-12-26 MED ORDER — METFORMIN HCL 1000 MG PO TABS: ORAL_TABLET | ORAL | 3 refills | Status: DC

## 2021-12-27 ENCOUNTER — Other Ambulatory Visit: Payer: Self-pay | Admitting: *Deleted

## 2021-12-27 DIAGNOSIS — K209 Esophagitis, unspecified without bleeding: Secondary | ICD-10-CM

## 2021-12-27 DIAGNOSIS — R110 Nausea: Secondary | ICD-10-CM

## 2021-12-27 DIAGNOSIS — R1010 Upper abdominal pain, unspecified: Secondary | ICD-10-CM

## 2021-12-27 DIAGNOSIS — K2970 Gastritis, unspecified, without bleeding: Secondary | ICD-10-CM

## 2021-12-27 MED ORDER — PANTOPRAZOLE SODIUM 40 MG PO TBEC: DELAYED_RELEASE_TABLET | ORAL | 3 refills | Status: DC

## 2021-12-27 MED ORDER — FAMOTIDINE 20 MG PO TABS: 20.0000 mg | ORAL_TABLET | Freq: Two times a day (BID) | ORAL | 3 refills | Status: DC

## 2021-12-28 ENCOUNTER — Other Ambulatory Visit: Payer: Self-pay | Admitting: Nurse Practitioner

## 2022-01-01 ENCOUNTER — Other Ambulatory Visit: Payer: Self-pay

## 2022-01-01 DIAGNOSIS — R7303 Prediabetes: Secondary | ICD-10-CM

## 2022-01-01 MED ORDER — METFORMIN HCL 1000 MG PO TABS: ORAL_TABLET | ORAL | 3 refills | Status: DC

## 2022-01-24 ENCOUNTER — Other Ambulatory Visit: Payer: Self-pay | Admitting: Nurse Practitioner

## 2022-01-24 ENCOUNTER — Telehealth: Payer: Self-pay | Admitting: Nurse Practitioner

## 2022-01-24 DIAGNOSIS — G4700 Insomnia, unspecified: Secondary | ICD-10-CM

## 2022-01-24 MED ORDER — TEMAZEPAM 30 MG PO CAPS: ORAL_CAPSULE | ORAL | 0 refills | Status: DC

## 2022-01-24 NOTE — Telephone Encounter (Signed)
Needing a refill on Temazepam to go to [facility] on [address]

## 2022-01-24 NOTE — Telephone Encounter (Signed)
Can you please fill this?

## 2022-02-13 ENCOUNTER — Other Ambulatory Visit: Payer: Self-pay | Admitting: Nurse Practitioner

## 2022-02-13 DIAGNOSIS — R7309 Other abnormal glucose: Secondary | ICD-10-CM

## 2022-02-14 ENCOUNTER — Other Ambulatory Visit: Payer: Self-pay | Admitting: Internal Medicine

## 2022-02-14 DIAGNOSIS — R519 Headache, unspecified: Secondary | ICD-10-CM

## 2022-02-24 ENCOUNTER — Other Ambulatory Visit: Payer: Self-pay | Admitting: Nurse Practitioner

## 2022-02-24 ENCOUNTER — Telehealth: Payer: Self-pay | Admitting: Nurse Practitioner

## 2022-02-24 DIAGNOSIS — G4700 Insomnia, unspecified: Secondary | ICD-10-CM

## 2022-02-24 MED ORDER — TEMAZEPAM 30 MG PO CAPS: ORAL_CAPSULE | ORAL | 0 refills | Status: DC

## 2022-02-24 MED ORDER — GABAPENTIN 300 MG PO CAPS: 300.0000 mg | ORAL_CAPSULE | Freq: Three times a day (TID) | ORAL | 2 refills | Status: AC

## 2022-02-24 NOTE — Telephone Encounter (Signed)
Pt is requesting a refill on temazepam and gabapentin to go to [facility] on [address]

## 2022-03-17 ENCOUNTER — Encounter: Payer: Self-pay | Admitting: Nurse Practitioner

## 2022-03-17 ENCOUNTER — Ambulatory Visit (INDEPENDENT_AMBULATORY_CARE_PROVIDER_SITE_OTHER): Payer: 59 | Admitting: Nurse Practitioner

## 2022-03-17 VITALS — BP 156/80 | HR 85 | Temp 97.5°F | Ht 59.25 in | Wt 163.4 lb

## 2022-03-17 DIAGNOSIS — L710 Perioral dermatitis: Secondary | ICD-10-CM | POA: Diagnosis not present

## 2022-03-17 DIAGNOSIS — I10 Essential (primary) hypertension: Secondary | ICD-10-CM

## 2022-03-17 MED ORDER — DOXYCYCLINE HYCLATE 100 MG PO CAPS: 100.0000 mg | ORAL_CAPSULE | Freq: Two times a day (BID) | ORAL | 0 refills | Status: AC

## 2022-03-17 NOTE — Patient Instructions (Signed)
Perioral dermatitis  Perioral dermatitis is an eruption which is usually located around the mouth and nose.  It can be a rash and/or red bumps.  It occasionally occurs around the eyes.  It may be itchy and may burn.  The exact cause is unknown.  Some types of makeup, moisturizers, dental products, and prescription creams may be partially responsible for the eruption.  Topical steroids such as cortisone creams can temporarily make the rash better but with discontinuation the rash tends to recur and worsen.  If you have been using topical steroids, your dermatologist may need to gradually taper the strength of steroids.  Topical antibiotics, elidel cream, protopic ointment, and oral antiobiotics may be prescribed to treat this condition.  Although perioral dermatitis is not an infection, some antibiotics have anti-inflammatory properties that help it greatly.  Avoid Parabens and sulfates, keep everything scent free.   Can use organic coconut oil

## 2022-03-17 NOTE — Progress Notes (Signed)
Assessment and Plan: Kelly Goodwin was seen today for acute visit.  Diagnoses and all orders for this visit:  Essential hypertension - continue medications, DASH diet, exercise and monitor at home. Call if greater than 130/80.  Go to the ER if any chest pain, shortness of breath, nausea, dizziness, severe HA, changes vision/speech  Perioral dermatitis Avoid Parabens and sulfates, keep everything scent free.  Can use organic coconut oil -     doxycycline (VIBRAMYCIN) 100 MG capsule; Take 1 capsule (100 mg total) by mouth 2 (two) times daily for 21 days.       Further disposition pending results of labs. Discussed med's effects and SE's.   Over 30 minutes of exam, counseling, chart review, and critical decision making was performed.   Future Appointments  Date Time Provider [place]  03/18/2022  4:00 PM [facility] DX DEXA 1 [facility]  06/02/2022  3:00 PM Alycia Rossetti, NP [facility] None    ------------------------------------------------------------------------------------------------------------------   HPI BP (!) 156/80   Pulse 85   Temp (!) 97.5 F (36.4 C)   Ht 4' 11.25" (1.505 m)   Wt 163 lb 6.4 oz (74.1 kg)   SpO2 96%   BMI 32.72 kg/m    61 y.o.female presents for facial acne that began 3 months ago. It is occuring all around her mouth /chin.     She is currently on Benicar HCT 40/25 mg PO QD.  Denies headaches, chest pain, shortness of breath and dizziness BP Readings from Last 3 Encounters:  03/17/22 (!) 156/80  12/23/21 (!) 140/78  09/10/21 130/68   BMI is Body mass index is 32.72 kg/m., she has been working on diet and exercise. Wt Readings from Last 3 Encounters:  03/17/22 163 lb 6.4 oz (74.1 kg)  12/23/21 168 lb (76.2 kg)  09/10/21 177 lb 12.8 oz (80.6 kg)    She is currently on Levothyroxine 100 mcg daily Lab Results  Component Value Date   TSH 1.05 12/23/2021      Past Medical History:  Diagnosis Date   Arthritis     Constipation, chronic    Diverticulitis    Diverticulosis    Fibroid uterus    History of gallstones    Hyperlipidemia    Hypertension    Hypothyroidism    Migraine    PCOS (polycystic ovarian syndrome)    takes metformin for this   Prediabetes    Thyroid disease      No Known Allergies  Current Outpatient Medications on File Prior to Visit  Medication Sig   COLLAGEN PO Take by mouth.   dicyclomine (BENTYL) 10 MG capsule TAKE 1 CAPSULE BY MOUTH THREE TIMES DAILY AS NEEDED FOR ABDOMINAL PAIN OR CRAMPING OR DIARRHEA   estradiol-norethindrone (ACTIVELLA) 1-0.5 MG tablet TAKE 1 TABLET BY MOUTH  DAILY   famotidine (PEPCID) 20 MG tablet Take 1 tablet (20 mg total) by mouth 2 (two) times daily.   fluconazole (DIFLUCAN) 150 MG tablet TAKE 1 TABLET(150 MG) BY MOUTH 1 TIME FOR 1 DOSE   gabapentin (NEURONTIN) 300 MG capsule Take 1 capsule (300 mg total) by mouth 3 (three) times daily.   GARLIC PO Take 1 tablet by mouth 2 (two) times daily.   ibuprofen (ADVIL) 800 MG tablet Take  1/2 to 1 tablet  3 x /day with Food ( every 8 hours) as needed                                                         /  TAKE                                    BY                                                                            MOUTH   levothyroxine (SYNTHROID) 100 MCG tablet TAKE 1 TABLET DAILY ON AN  EMPTY STOMACH WITH WATER  ONLY FOR 30 MIN AND NO  ANTACIDS, CALCIUM,  MAGNESIUM FOR 4 HRS. AVOID  BIOTIN.   metFORMIN (GLUCOPHAGE) 1000 MG tablet TAKE 1 TABLET TWICE DAILY FOR DIABETES   Multiple Vitamins-Minerals (MULTIPLE VITAMINS/WOMENS PO) Take by mouth.   olmesartan-hydrochlorothiazide (BENICAR HCT) 40-25 MG tablet TAKE 1 TABLET BY MOUTH  DAILY FOR BLOOD PRESSURE ,  REPLACES LOSARTAN/HCTZ  TABLET   OVER THE COUNTER MEDICATION Sea moss   OZEMPIC, 2 MG/DOSE, 8 MG/3ML SOPN INJECT '2MG'$  INTO THE SKIN ONCE A WEEK   pantoprazole (PROTONIX) 40 MG tablet TAKE 1 TABLET(40  MG) BY MOUTH TWICE DAILY   rifaximin (XIFAXAN) 550 MG TABS tablet Take 1 tablet (550 mg total) by mouth 2 (two) times daily.   Rimegepant Sulfate (NURTEC) 75 MG TBDP Take 1 tab under tongue once daily as needed with onset of migraine.   temazepam (RESTORIL) 30 MG capsule TAKE 1 CAPSULE BY MOUTH EVERY NIGHT AT BEDTIME AS NEEDED FOR INSOMNIA   triamcinolone cream (KENALOG) 0.1 % Apply 1 Application topically 2 (two) times daily.   valACYclovir (VALTREX) 500 MG tablet TAKE 1 TABLET BY MOUTH  TWICE DAILY AS NEEDED FOR  COLD SORES / FEVER BLISTERS   lisdexamfetamine (VYVANSE) 30 MG capsule Take 1 capsule (30 mg total) by mouth daily. (Patient not taking: Reported on 03/17/2022)   No current facility-administered medications on file prior to visit.    ROS: all negative except above.   Physical Exam:  BP (!) 156/80   Pulse 85   Temp (!) 97.5 F (36.4 C)   Ht 4' 11.25" (1.505 m)   Wt 163 lb 6.4 oz (74.1 kg)   SpO2 96%   BMI 32.72 kg/m   General Appearance: Well nourished, in no apparent distress. Eyes: PERRLA, EOMs, conjunctiva no swelling or erythema Sinuses: No Frontal/maxillary tenderness ENT/Mouth: Ext aud canals clear, TMs without erythema, bulging. No erythema, swelling, or exudate on post pharynx.  Tonsils not swollen or erythematous. Hearing normal.  Neck: Supple, thyroid normal.  Respiratory: Respiratory effort normal, BS equal bilaterally without rales, rhonchi, wheezing or stridor.  Cardio: RRR with no MRGs. Brisk peripheral pulses without edema.  Abdomen: Soft, + BS.  Non tender, no guarding, rebound, hernias, masses. Lymphatics: Non tender without lymphadenopathy.  Musculoskeletal: Full ROM, 5/5 strength, normal gait.  Skin: Warm, dry . Some papules around mouth and chin, no drainage Neuro: Cranial nerves intact. Normal muscle tone, no cerebellar symptoms. Sensation intact.  Psych: Awake and oriented X 3, normal affect, Insight and Judgment appropriate.     Alycia Rossetti, NP 4:24 PM [facility]

## 2022-03-18 ENCOUNTER — Ambulatory Visit
Admission: RE | Admit: 2022-03-18 | Discharge: 2022-03-18 | Disposition: A | Discharge status: Home / Self Care | Payer: 59 | Source: Ambulatory Visit | Attending: Obstetrics and Gynecology | Admitting: Obstetrics and Gynecology

## 2022-03-18 DIAGNOSIS — E2839 Other primary ovarian failure: Secondary | ICD-10-CM

## 2022-03-25 ENCOUNTER — Other Ambulatory Visit: Payer: Self-pay | Admitting: Nurse Practitioner

## 2022-03-25 DIAGNOSIS — L710 Perioral dermatitis: Secondary | ICD-10-CM

## 2022-03-25 DIAGNOSIS — G4700 Insomnia, unspecified: Secondary | ICD-10-CM

## 2022-03-25 MED ORDER — TEMAZEPAM 30 MG PO CAPS: ORAL_CAPSULE | ORAL | 0 refills | Status: DC

## 2022-03-25 MED ORDER — METRONIDAZOLE 0.75 % EX GEL: 1.0000 | Freq: Two times a day (BID) | CUTANEOUS | 0 refills | Status: AC

## 2022-03-25 NOTE — Telephone Encounter (Signed)
Will you put in her vaccination records

## 2022-04-03 ENCOUNTER — Other Ambulatory Visit: Payer: Self-pay | Admitting: Nurse Practitioner

## 2022-04-03 DIAGNOSIS — F909 Attention-deficit hyperactivity disorder, unspecified type: Secondary | ICD-10-CM

## 2022-04-03 MED ORDER — LISDEXAMFETAMINE DIMESYLATE 30 MG PO CAPS: 30.0000 mg | ORAL_CAPSULE | Freq: Every day | ORAL | 0 refills | Status: DC

## 2022-04-03 NOTE — Progress Notes (Signed)
PDMP is reviewed and appropriate  

## 2022-04-08 ENCOUNTER — Encounter: Payer: Self-pay | Admitting: Internal Medicine

## 2022-04-11 ENCOUNTER — Other Ambulatory Visit: Payer: Self-pay | Admitting: Nurse Practitioner

## 2022-04-11 DIAGNOSIS — J029 Acute pharyngitis, unspecified: Secondary | ICD-10-CM

## 2022-04-11 DIAGNOSIS — L709 Acne, unspecified: Secondary | ICD-10-CM

## 2022-04-11 MED ORDER — AZELEX 20 % EX CREA: TOPICAL_CREAM | Freq: Two times a day (BID) | CUTANEOUS | 0 refills | Status: AC

## 2022-04-30 ENCOUNTER — Other Ambulatory Visit: Payer: Self-pay | Admitting: Nurse Practitioner

## 2022-04-30 DIAGNOSIS — G4700 Insomnia, unspecified: Secondary | ICD-10-CM

## 2022-04-30 MED ORDER — TEMAZEPAM 30 MG PO CAPS: ORAL_CAPSULE | ORAL | 0 refills | Status: DC

## 2022-05-08 ENCOUNTER — Other Ambulatory Visit: Payer: Self-pay | Admitting: Internal Medicine

## 2022-05-29 ENCOUNTER — Other Ambulatory Visit: Payer: Self-pay | Admitting: Nurse Practitioner

## 2022-05-29 ENCOUNTER — Encounter: Payer: 59 | Admitting: Nurse Practitioner

## 2022-05-29 ENCOUNTER — Telehealth: Payer: Self-pay | Admitting: Nurse Practitioner

## 2022-05-29 DIAGNOSIS — G4700 Insomnia, unspecified: Secondary | ICD-10-CM

## 2022-05-29 MED ORDER — TEMAZEPAM 30 MG PO CAPS: ORAL_CAPSULE | ORAL | 0 refills | Status: DC

## 2022-05-29 NOTE — Telephone Encounter (Signed)
Pt is needing a refill on temazepam, she has 2 pills left and will run out over the weekend
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
## 2022-06-02 ENCOUNTER — Ambulatory Visit (INDEPENDENT_AMBULATORY_CARE_PROVIDER_SITE_OTHER): Payer: 59 | Admitting: Nurse Practitioner

## 2022-06-02 ENCOUNTER — Encounter: Payer: Self-pay | Admitting: Nurse Practitioner

## 2022-06-02 VITALS — BP 144/84 | HR 117 | Temp 97.5°F | Ht 59.25 in | Wt 166.4 lb

## 2022-06-02 DIAGNOSIS — R7309 Other abnormal glucose: Secondary | ICD-10-CM

## 2022-06-02 DIAGNOSIS — I10 Essential (primary) hypertension: Secondary | ICD-10-CM

## 2022-06-02 DIAGNOSIS — E559 Vitamin D deficiency, unspecified: Secondary | ICD-10-CM

## 2022-06-02 DIAGNOSIS — Z136 Encounter for screening for cardiovascular disorders: Secondary | ICD-10-CM | POA: Diagnosis not present

## 2022-06-02 DIAGNOSIS — E782 Mixed hyperlipidemia: Secondary | ICD-10-CM

## 2022-06-02 DIAGNOSIS — Z0000 Encounter for general adult medical examination without abnormal findings: Secondary | ICD-10-CM | POA: Diagnosis not present

## 2022-06-02 DIAGNOSIS — E039 Hypothyroidism, unspecified: Secondary | ICD-10-CM

## 2022-06-02 DIAGNOSIS — Z1389 Encounter for screening for other disorder: Secondary | ICD-10-CM

## 2022-06-02 DIAGNOSIS — I700 Atherosclerosis of aorta: Secondary | ICD-10-CM

## 2022-06-02 DIAGNOSIS — Z79899 Other long term (current) drug therapy: Secondary | ICD-10-CM

## 2022-06-02 DIAGNOSIS — L989 Disorder of the skin and subcutaneous tissue, unspecified: Secondary | ICD-10-CM

## 2022-06-02 DIAGNOSIS — Z0001 Encounter for general adult medical examination with abnormal findings: Secondary | ICD-10-CM

## 2022-06-02 NOTE — Patient Instructions (Signed)

## 2022-06-03 LAB — COMPLETE METABOLIC PANEL WITH GFR
AG Ratio: 1.5 (calc) (ref 1.0–2.5)
ALT: 33 U/L — ABNORMAL HIGH (ref 6–29)
AST: 20 U/L (ref 10–35)
Albumin: 4.1 g/dL (ref 3.6–5.1)
Alkaline phosphatase (APISO): 61 U/L (ref 37–153)
BUN: 9 mg/dL (ref 7–25)
CO2: 26 mmol/L (ref 20–32)
Calcium: 9.8 mg/dL (ref 8.6–10.4)
Chloride: 107 mmol/L (ref 98–110)
Creat: 0.61 mg/dL (ref 0.50–1.05)
Globulin: 2.8 g/dL (calc) (ref 1.9–3.7)
Glucose, Bld: 83 mg/dL (ref 65–99)
Potassium: 4.4 mmol/L (ref 3.5–5.3)
Sodium: 143 mmol/L (ref 135–146)
Total Bilirubin: 0.2 mg/dL (ref 0.2–1.2)
Total Protein: 6.9 g/dL (ref 6.1–8.1)
eGFR: 102 mL/min/{1.73_m2} (ref >=60)

## 2022-06-03 LAB — CBC WITH DIFFERENTIAL/PLATELET
Absolute Monocytes: 693 cells/uL (ref 200–950)
Basophils Absolute: 92 cells/uL (ref 0–200)
Basophils Relative: 1.2 %
Eosinophils Absolute: 231 cells/uL (ref 15–500)
Eosinophils Relative: 3 %
HCT: 38.4 % (ref 35.0–45.0)
Hemoglobin: 12.7 g/dL (ref 11.7–15.5)
Lymphs Abs: 2426 cells/uL (ref 850–3900)
MCH: 29.1 pg (ref 27.0–33.0)
MCHC: 33.1 g/dL (ref 32.0–36.0)
MCV: 88.1 fL (ref 80.0–100.0)
MPV: 9.7 fL (ref 7.5–12.5)
Monocytes Relative: 9 %
Neutro Abs: 4258 cells/uL (ref 1500–7800)
Neutrophils Relative %: 55.3 %
Platelets: 376 10*3/uL (ref 140–400)
RBC: 4.36 10*6/uL (ref 3.80–5.10)
RDW: 13.6 % (ref 11.0–15.0)
Total Lymphocyte: 31.5 %
WBC: 7.7 10*3/uL (ref 3.8–10.8)

## 2022-06-03 LAB — LIPID PANEL
Cholesterol: 193 mg/dL (ref <200)
HDL: 49 mg/dL — ABNORMAL LOW (ref >=50)
LDL Cholesterol (Calc): 120 mg/dL (calc) — ABNORMAL HIGH
Non-HDL Cholesterol (Calc): 144 mg/dL (calc) — ABNORMAL HIGH (ref <130)
Total CHOL/HDL Ratio: 3.9 (calc) (ref <5.0)
Triglycerides: 127 mg/dL (ref <150)

## 2022-06-03 LAB — URINALYSIS W MICROSCOPIC + REFLEX CULTURE
Bacteria, UA: NONE SEEN /HPF
Bilirubin Urine: NEGATIVE
Glucose, UA: NEGATIVE
Hgb urine dipstick: NEGATIVE
Hyaline Cast: NONE SEEN /LPF
Ketones, ur: NEGATIVE
Leukocyte Esterase: NEGATIVE
Nitrites, Initial: NEGATIVE
Protein, ur: NEGATIVE
RBC / HPF: NONE SEEN /HPF (ref 0–2)
Specific Gravity, Urine: 1.004 (ref 1.001–1.035)
Squamous Epithelial / HPF: NONE SEEN /HPF (ref <=5)
WBC, UA: NONE SEEN /HPF (ref 0–5)
pH: 7 (ref 5.0–8.0)

## 2022-06-03 LAB — MAGNESIUM: Magnesium: 2 mg/dL (ref 1.5–2.5)

## 2022-06-03 LAB — HEMOGLOBIN A1C
Hgb A1c MFr Bld: 5.4 % of total Hgb (ref <5.7)
Mean Plasma Glucose: 108 mg/dL
eAG (mmol/L): 6 mmol/L

## 2022-06-03 LAB — NO CULTURE INDICATED

## 2022-06-03 LAB — MICROALBUMIN / CREATININE URINE RATIO
Creatinine, Urine: 18 mg/dL — ABNORMAL LOW (ref 20–275)
Microalb Creat Ratio: 17 mcg/mg creat (ref <30)
Microalb, Ur: 0.3 mg/dL

## 2022-06-03 LAB — TSH: TSH: 1.57 mIU/L (ref 0.40–4.50)

## 2022-06-03 LAB — VITAMIN D 25 HYDROXY (VIT D DEFICIENCY, FRACTURES): Vit D, 25-Hydroxy: 111 ng/mL — ABNORMAL HIGH (ref 30–100)

## 2022-06-18 ENCOUNTER — Other Ambulatory Visit: Payer: Self-pay | Admitting: Nurse Practitioner

## 2022-06-18 DIAGNOSIS — R7309 Other abnormal glucose: Secondary | ICD-10-CM

## 2022-06-30 ENCOUNTER — Encounter: Payer: Self-pay | Admitting: Dermatology

## 2022-06-30 ENCOUNTER — Other Ambulatory Visit: Payer: Self-pay | Admitting: Nurse Practitioner

## 2022-06-30 ENCOUNTER — Telehealth: Payer: Self-pay | Admitting: Nurse Practitioner

## 2022-06-30 ENCOUNTER — Ambulatory Visit (INDEPENDENT_AMBULATORY_CARE_PROVIDER_SITE_OTHER): Payer: 59 | Admitting: Dermatology

## 2022-06-30 VITALS — BP 135/85

## 2022-06-30 DIAGNOSIS — L700 Acne vulgaris: Secondary | ICD-10-CM

## 2022-06-30 DIAGNOSIS — L810 Postinflammatory hyperpigmentation: Secondary | ICD-10-CM | POA: Diagnosis not present

## 2022-06-30 DIAGNOSIS — G4700 Insomnia, unspecified: Secondary | ICD-10-CM

## 2022-06-30 MED ORDER — DOXYCYCLINE MONOHYDRATE 100 MG PO CAPS: 100.0000 mg | ORAL_CAPSULE | Freq: Two times a day (BID) | ORAL | 1 refills | Status: DC

## 2022-06-30 MED ORDER — TEMAZEPAM 30 MG PO CAPS: ORAL_CAPSULE | ORAL | 0 refills | Status: DC

## 2022-06-30 MED ORDER — TRETINOIN 0.025 % EX CREA: TOPICAL_CREAM | CUTANEOUS | 2 refills | Status: DC

## 2022-06-30 MED ORDER — FLUCONAZOLE 150 MG PO TABS: 150.0000 mg | ORAL_TABLET | Freq: Every day | ORAL | 1 refills | Status: DC

## 2022-06-30 MED ORDER — CLINDAMYCIN PHOSPHATE 1 % EX LOTN: TOPICAL_LOTION | Freq: Every morning | CUTANEOUS | 2 refills | Status: DC

## 2022-06-30 NOTE — Telephone Encounter (Signed)
Pt is requesting a refill on temazepam to go to [facility] on [address]

## 2022-06-30 NOTE — Progress Notes (Signed)
   New Patient Visit  Subjective  Kelly Goodwin is a 62 y.o. female who presents for the following: Acne (PCP suggested Derm appointment for hormonal acne. Started October 2023, spread since then. Around chin and under chin. Pt states has not had acne since highschool. PCP gave two creams. Neither worked at all. Metronidazole and azelaic acid used BID. Tried peroxide. No new medications or skin care products, no face makeup, Does not wear mask. Postmenopausal. /).    Objective  Well appearing patient in no apparent distress; mood and affect are within normal limits.  A focused examination was performed including face. Relevant physical exam findings are noted in the Assessment and Plan.  Face Open and closed comedones No active cysts today   Chin Hyperpigmented macules    Assessment & Plan  Acne vulgaris Face  Doxycycline '100mg'$  BID for five days PRN flares Tretinoin pea sized amount at night 3x/week after washing with Cetaphil and using a thin layer of moisturizer  Clindamycin lotion apply in the morning Fluconazole one tablet daily PRN for onset of yeast infection    clindamycin (CLEOCIN-T) 1 % lotion - Face Apply topically in the morning. To face  tretinoin (RETIN-A) 0.025 % cream - Face Use pea sized amount to face 3 times/week.(M-W-F)  doxycycline (MONODOX) 100 MG capsule - Face Take 1 capsule (100 mg total) by mouth in the morning and at bedtime. For 5 days as needed for flares  fluconazole (DIFLUCAN) 150 MG tablet - Face Take 1 tablet (150 mg total) by mouth daily. As needed for symptoms of yeast infection.  Postinflammatory hyperpigmentation Chin  Restart the azelaic acid on the off days from the tretinoin    Return in about 3 months (around 09/30/2022) for Acne .  I, Zigmund Gottron, CMA, am acting as scribe for Talbot Grumbling, MD.   Documentation: I have reviewed the above documentation for accuracy and completeness, and I agree with the  above  [place], DO

## 2022-06-30 NOTE — Patient Instructions (Addendum)
Understanding Acne: A Dermatology Patient Handout  What is Acne? Acne is a common skin condition that occurs when hair follicles become clogged with oil and dead skin cells. This often leads to the formation of pimples, blackheads, whiteheads, or cysts on the skin.  Causes and Mechanism of Action: Acne can be caused by various factors, including:  1. Excess Oil Production: Overproduction of oil (sebum) by the sebaceous glands can clog pores and lead to acne. 2. Dead Skin Cells: Buildup of dead skin cells on the skin's surface can block pores and contribute to acne. 3. Bacteria: Propionibacterium acnes (P. acnes) bacteria can multiply in clogged pores, leading to inflammation and acne. 4. Hormonal Changes: Fluctuations in hormone levels, particularly during puberty, menstruation, pregnancy, or hormonal disorders, can trigger acne.  Hygiene and Environmental Factors: Maintaining good hygiene practices can help prevent and manage acne. It's essential to:  [facility] your face twice daily with a gentle cleanser to remove excess oil, dirt, and dead skin cells. - Avoid excessive scrubbing or harsh products, as they can irritate the skin and worsen acne. - Keep hair clean and away from the face, as oils and dirt from the hair can exacerbate acne. - Avoid picking or squeezing pimples, as this can lead to scarring and further inflammation. - Consider environmental factors such as humidity, pollution, and sweating, which can aggravate acne.  Importance of Diet: While diet alone may not cause acne, certain foods can potentially worsen or trigger breakouts in some individuals. It's advisable to:  - Limit intake of high-glycemic foods like sugary snacks and processed carbohydrates, as they may contribute to acne. - Consider reducing dairy consumption, as some studies suggest a link between dairy products and acne. - Stay hydrated by drinking plenty of water, as dehydration can affect skin health.  Common  Treatments: Treatment for acne depends on its severity and underlying causes. Common treatment options include:  1. Topical Treatments:    - Over-the-counter creams or gels containing benzoyl peroxide, salicylic acid, or retinoids can help unclog pores and reduce inflammation.    - Prescription-strength topical medications like retinoids, antibiotics, or combination therapies may be recommended for more severe acne.  2. Oral Medications:    - Oral antibiotics such as doxycycline or minocycline may be prescribed to reduce acne-causing bacteria and inflammation.    - Hormonal therapies like oral contraceptives or spironolactone may be prescribed for women with hormonal acne.    - Isotretinoin (Accutane) is a potent oral medication reserved for severe, treatment-resistant acne. It reduces oil production and prevents pore blockage.  Why Prescriptions and Oral Medications May Be Needed: In cases of severe or hormonal acne, topical treatments alone may not be sufficient to control breakouts. Prescription medications and oral treatments may be necessary to:  - Address underlying hormonal imbalances contributing to acne. - Target acne-causing bacteria more effectively. - Reduce inflammation and prevent scarring.  Daily Regimen Template:  Morning: 1. Wash your face with a gentle cleanser (Cetaphil, CeraVe, Neutrogena) 2. Apply a pea-sized amount of topical CLINDAMYCIN 1% LOTION 3. Follow with a moisturizer and sunscreen suitable for acne-prone skin.  Evening: 1. Wash your face with a gentle cleanser (Cetaphil, CeraVe, Neutrogena) 2. Apply a pea-sized amount of TRETINOINT 0.025% CREAM to entire face.  Start only using 2-3 night per week and gradually increase as tolerated. 3. On nights when not using TRETINOIN apply AZELEIC ACID Cream to entire face 4. Apply a non-comedogenic moisturizer to keep the skin hydrated overnight  (Neutrogena, CeraVe,  Cetaphil)  Note: Always follow your  dermatologist's recommendations and treatment plan for best results. Consistency is key to managing acne effectively. If you experience any severe side effects or worsening of symptoms, consult your healthcare provider promptly.     Doxycycline should be taken with food to prevent nausea. Do not lay down for 30 minutes after taking. Be cautious with sun exposure and use good sun protection while on this medication. Pregnant women should not take this medication.    Topical retinoid medications like tretinoin/Retin-A, adapalene/Differin, tazarotene/Fabior, and Epiduo/Epiduo Forte can cause dryness and irritation when first started. Only apply a pea-sized amount to the entire affected area. Avoid applying it around the eyes, edges of mouth and creases at the nose. If you experience irritation, use a good moisturizer first and/or apply the medicine less often. If you are doing well with the medicine, you can increase how often you use it until you are applying every night. Be careful with sun protection while using this medication as it can make you sensitive to the sun. This medicine should not be used by pregnant women.

## 2022-07-29 ENCOUNTER — Telehealth: Payer: Self-pay | Admitting: Nurse Practitioner

## 2022-07-29 NOTE — Telephone Encounter (Signed)
Patient is requesting a refill on Temazepam to [facility] on [address] and [address]

## 2022-07-30 ENCOUNTER — Other Ambulatory Visit: Payer: Self-pay | Admitting: Nurse Practitioner

## 2022-07-30 DIAGNOSIS — G4700 Insomnia, unspecified: Secondary | ICD-10-CM

## 2022-07-30 MED ORDER — TEMAZEPAM 30 MG PO CAPS: ORAL_CAPSULE | ORAL | 0 refills | Status: DC

## 2022-08-14 ENCOUNTER — Other Ambulatory Visit: Payer: Self-pay

## 2022-08-14 DIAGNOSIS — I10 Essential (primary) hypertension: Secondary | ICD-10-CM

## 2022-08-14 MED ORDER — OLMESARTAN MEDOXOMIL-HCTZ 40-25 MG PO TABS: ORAL_TABLET | ORAL | 3 refills | Status: AC

## 2022-08-29 ENCOUNTER — Other Ambulatory Visit: Payer: Self-pay | Admitting: Nurse Practitioner

## 2022-08-29 ENCOUNTER — Telehealth: Payer: Self-pay | Admitting: Nurse Practitioner

## 2022-08-29 DIAGNOSIS — G4700 Insomnia, unspecified: Secondary | ICD-10-CM

## 2022-08-29 MED ORDER — TEMAZEPAM 30 MG PO CAPS: ORAL_CAPSULE | ORAL | 0 refills | Status: DC

## 2022-08-29 NOTE — Telephone Encounter (Signed)
Patient is requesting a refill on Temazepam to [facility] on [facility] and [address]

## 2022-09-30 ENCOUNTER — Encounter: Payer: Self-pay | Admitting: Dermatology

## 2022-09-30 ENCOUNTER — Ambulatory Visit (INDEPENDENT_AMBULATORY_CARE_PROVIDER_SITE_OTHER): Payer: 59 | Admitting: Dermatology

## 2022-09-30 VITALS — BP 112/72

## 2022-09-30 DIAGNOSIS — L700 Acne vulgaris: Secondary | ICD-10-CM

## 2022-09-30 MED ORDER — SPIRONOLACTONE 50 MG PO TABS: 50.0000 mg | ORAL_TABLET | Freq: Every day | ORAL | 3 refills | Status: DC

## 2022-09-30 MED ORDER — TRETINOIN 0.05 % EX CREA: TOPICAL_CREAM | Freq: Every day | CUTANEOUS | 3 refills | Status: DC

## 2022-09-30 MED ORDER — CLINDAMYCIN PHOSPHATE 1 % EX LOTN: TOPICAL_LOTION | Freq: Every day | CUTANEOUS | 3 refills | Status: AC

## 2022-09-30 NOTE — Progress Notes (Signed)
   Follow-Up Visit   Subjective  Kelly Goodwin is a 62 y.o. female who presents for the following: Acne Vulgaris  She states it is only slightly better. The frequency of flares is a less.  Current treatment: Doxycycline 100mg  BID for five days PRN flares. She is taking it every other week. Tretinoin 0.025% pea sized amount at night 3x/week after washing with Cetaphil and using a thin layer of moisturizer. She is using Azelaic acid every other night when not using Tretinoin  Clindamycin lotion apply in the morning Fluconazole one tablet daily PRN for onset of yeast infection  The following portions of the chart were reviewed this encounter and updated as appropriate: medications, allergies, medical history  Review of Systems:  No other skin or systemic complaints except as noted in HPI or Assessment and Plan.  Objective  Well appearing patient in no apparent distress; mood and affect are within normal limits.  Areas Examined: Face, chest and back  Relevant exam findings are noted in the Assessment and Plan.   Assessment & Plan    ACNE VULGARIS Exam: Open comedones and inflammatory papules with a few acne cysts at the chin  Not at goal  Treatment Plan: Spironolactone 50mg  daily Tretinoin 0.05% pea sized amount at night 3x/week. She is using Azelaic acid every other night when not using Tretinoin cream. Clindamycin lotion apply in the morning Neutrogena acne patches for cysts Samples of Neutrogena Hydro boost cleanser and moisturizer given.   Return in about 3 months (around 12/31/2022) for Acne Follow Up.  Jaclynn Guarneri, CMA, am acting as scribe for [employer], DO.   Documentation: I have reviewed the above documentation for accuracy and completeness, and I agree with the above.  Langston Reusing, DO

## 2022-09-30 NOTE — Patient Instructions (Addendum)
Due to recent changes in healthcare laws, you may see results of your pathology and/or laboratory studies on MyChart before the doctors have had a chance to review them. We understand that in some cases there may be results that are confusing or concerning to you. Please understand that not all results are received at the same time and often the doctors may need to interpret multiple results in order to provide you with the best plan of care or course of treatment. Therefore, we ask that you please give us 2 business days to thoroughly review all your results before contacting the office for clarification. Should we see a critical lab result, you will be contacted sooner.   If You Need Anything After Your Visit  If you have any questions or concerns for your doctor, please call our main line at [phone] If no one answers, please leave a voicemail as directed and we will return your call as soon as possible. Messages left after 4 pm will be answered the following business day.   You may also send us a message via MyChart. We typically respond to MyChart messages within 1-2 business days.  For prescription refills, please ask your pharmacy to contact our office. Our fax number is [phone].  If you have an urgent issue when the clinic is closed that cannot wait until the next business day, you can page your doctor at the number below.    Please note that while we do our best to be available for urgent issues outside of office hours, we are not available 24/7.   If you have an urgent issue and are unable to reach us, you may choose to seek medical care at your doctor's office, retail clinic, urgent care center, or emergency room.  If you have a medical emergency, please immediately call 911 or go to the emergency department. In the event of inclement weather, please call our main line at [phone] for an update on the status of any delays or closures.  Dermatology Medication Tips: Please  keep the boxes that topical medications come in in order to help keep track of the instructions about where and how to use these. Pharmacies typically print the medication instructions only on the boxes and not directly on the medication tubes.   If your medication is too expensive, please contact our office at [phone] or send us a message through MyChart.   We are unable to tell what your co-pay for medications will be in advance as this is different depending on your insurance coverage. However, we may be able to find a substitute medication at lower cost or fill out paperwork to get insurance to cover a needed medication.   If a prior authorization is required to get your medication covered by your insurance company, please allow us 1-2 business days to complete this process.  Drug prices often vary depending on where the prescription is filled and some pharmacies may offer cheaper prices.  The website www.goodrx.com contains coupons for medications through different pharmacies. The prices here do not account for what the cost may be with help from insurance (it may be cheaper with your insurance), but the website can give you the price if you did not use any insurance.  - You can print the associated coupon and take it with your prescription to the pharmacy.  - You may also stop by our office during regular business hours and pick up a GoodRx coupon card.  - If you need your   prescription sent electronically to a different pharmacy, notify our office through [facility] MyChart or by phone at [phone]     

## 2022-10-02 ENCOUNTER — Telehealth: Payer: Self-pay | Admitting: Nurse Practitioner

## 2022-10-02 ENCOUNTER — Other Ambulatory Visit: Payer: Self-pay | Admitting: Nurse Practitioner

## 2022-10-02 DIAGNOSIS — G4700 Insomnia, unspecified: Secondary | ICD-10-CM

## 2022-10-02 MED ORDER — TEMAZEPAM 30 MG PO CAPS: ORAL_CAPSULE | ORAL | 0 refills | Status: DC

## 2022-10-02 NOTE — Telephone Encounter (Signed)
Patient is requesting a refill on Temazepam to [facility] at [facility]

## 2022-10-07 ENCOUNTER — Other Ambulatory Visit: Payer: Self-pay | Admitting: Nurse Practitioner

## 2022-10-07 DIAGNOSIS — R7309 Other abnormal glucose: Secondary | ICD-10-CM

## 2022-10-08 ENCOUNTER — Other Ambulatory Visit: Payer: Self-pay | Admitting: Obstetrics and Gynecology

## 2022-10-08 DIAGNOSIS — Z1231 Encounter for screening mammogram for malignant neoplasm of breast: Secondary | ICD-10-CM

## 2022-10-23 ENCOUNTER — Ambulatory Visit
Admission: RE | Admit: 2022-10-23 | Discharge: 2022-10-23 | Disposition: A | Discharge status: Home / Self Care | Payer: 59 | Source: Ambulatory Visit | Attending: Obstetrics and Gynecology | Admitting: Obstetrics and Gynecology

## 2022-10-23 DIAGNOSIS — Z1231 Encounter for screening mammogram for malignant neoplasm of breast: Secondary | ICD-10-CM

## 2022-10-27 ENCOUNTER — Other Ambulatory Visit: Payer: Self-pay | Admitting: Nurse Practitioner

## 2022-10-27 DIAGNOSIS — R7303 Prediabetes: Secondary | ICD-10-CM

## 2022-11-03 ENCOUNTER — Other Ambulatory Visit: Payer: Self-pay | Admitting: Nurse Practitioner

## 2022-11-03 ENCOUNTER — Telehealth: Payer: Self-pay | Admitting: Nurse Practitioner

## 2022-11-03 DIAGNOSIS — G4700 Insomnia, unspecified: Secondary | ICD-10-CM

## 2022-11-03 MED ORDER — TEMAZEPAM 30 MG PO CAPS
ORAL_CAPSULE | ORAL | 0 refills | Status: DC
Start: 2022-11-03 — End: 2022-12-05

## 2022-11-03 NOTE — Telephone Encounter (Signed)
Patient is requesting refill on Temazepam to [facility] on [facility].

## 2022-12-01 NOTE — Progress Notes (Unsigned)
6 MONTH FOLLOW UP   Assessment and Plan:   Aortic atherosclerosis (HCC) Per CT Abdomen 09/2019 Control blood pressure, cholesterol, glucose, increase exercise.   Essential hypertension - continue medications, DASH diet, exercise and monitor at home. Call if greater than 130/80.  -     CBC with Diff -     COMPLETE METABOLIC PANEL WITH GFR  Mixed hyperlipidemia decrease fatty foods (saturated fat) increase fiber increase activity. -     Lipid Profile  PCOS/other abnormal glucose (prediabetes) Discussed disease and risks Discussed diet/exercise, weight management  Check A1C q45m; defer this visit - patient has lost weight, now in normal range, will continue to monitor  Hypothyroidism, unspecified type Continue current medications Reminder to take on an empty stomach 30-39mins before first meal of the day. No antacid medications for 4 hours. -     TSH  CKD stage 2 (Mild) Increase fluids, avoid NSAIDS, monitor sugars, will monitor  -CMP  Obesity - BMI 33 with comorbidities Has + binge eating; vyvanse- does give dry mouth.  Will try taking every other day and use biotene moisturizing products Has tried and failed phentermine, contrave, qysema, belviq, can not afford saxenda, topamax had AE's.  Improved with vyvanse and ozempic Continue to increase activity and decrease saturated fats and simple carbs  Medication management -     Magnesium  Vitamin D deficiency Continue Vit D supplementation  Insomnia Continue Temazepam nightly as needed Practice good sleep hygiene    Discussed med's effects and SE's.  Further disposition pending results if labs check today. Discussed med's effects and SE's.   Over 30 minutes of face to face interview, exam, counseling, chart review, and critical decision making was performed.   Future Appointments  Date Time Provider Department Center  12/02/2022  4:00 PM Raynelle Dick, NP [facility] None  12/30/2022  4:15 PM Terri Piedra, DO  [facility] None  06/04/2023  3:00 PM Raynelle Dick, NP [facility] None      HPI  62 y.o. female  presents for 6 month follow up on htn, hyperlipidemia, PCOS/anormal glucose, obesity, vitamin D.    She has IBS C/D, hx of diverticulosis, prone to constipation. She is currently on Rifaximin, had run out and is no longer taking,  She uses laxatives, miralax daily and tea.   She does use Vyvanse 30 mg daily for binge eating .  It is giving her very dry mouth.   Left foot remains swollen, was previously seen by Dr. Lajoyce Corners. She needs to make a follow up appointment.  She has been getting mosquito bites that will turn into sores and scar. They will sting and burn.   BMI is There is no height or weight on file to calculate BMI., she has been working on diet, trying to make better choices . Limited benefit with phentermine, belvia, qysima, topiramate (SE), contrave, cost barrier with saxenda. Some benefit with vyvanse for binge eating behaviors but not currently taking. Currently on Ozempic 2 mg SQ QW Wt Readings from Last 3 Encounters:  06/02/22 166 lb 6.4 oz (75.5 kg)  03/17/22 163 lb 6.4 oz (74.1 kg)  12/23/21 168 lb (76.2 kg)   Her blood pressure has been controlled at home, today their BP is  . She is walking 2-3 miles 4 times a week and doing weight twice a week.  BP Readings from Last 3 Encounters:  09/30/22 112/72  06/30/22 135/85  06/02/22 (!) 144/84   She does not workout. She denies chest  pain, shortness of breath, dizziness.    She is not on cholesterol medication and denies myalgias. Her cholesterol is at goal. The cholesterol last visit was:  Lab Results  Component Value Date   CHOL 193 06/02/2022   HDL 49 (L) 06/02/2022   LDLCALC 120 (H) 06/02/2022   TRIG 127 06/02/2022   CHOLHDL 3.9 06/02/2022  . She has been working on diet and exercise for PCOS, was on metoformin 2000 mg since her 20's, and on ozempic 2 mg SQ QW she is on bASA, she is on ACE/ARB and denies foot  ulcerations, hyperglycemia, hypoglycemia , increased appetite, nausea, paresthesia of the feet, polydipsia, polyuria, visual disturbances, vomiting and weight loss. Last A1C in the office was:  Lab Results  Component Value Date   HGBA1C 5.4 06/02/2022   Patient is on Vitamin D supplement, reports was taking high dose supplement previous, now taking calcium+ D3 supplement only, ? 600 IU Lab Results  Component Value Date   VD25OH 111 (H) 06/02/2022     She is on thyroid medication. Her medication was not changed last visit.  She tried armour thyroid but states she was too tired. She is alternating 100 mcg and 50 mcg every other day, hasn't changed dose recently.  Lab Results  Component Value Date   TSH 1.57 06/02/2022     Current Medications:   Current Outpatient Medications (Endocrine & Metabolic):    estradiol-norethindrone (ACTIVELLA) 1-0.5 MG tablet, TAKE 1 TABLET BY MOUTH  DAILY   levothyroxine (SYNTHROID) 100 MCG tablet, TAKE 1 TABLET DAILY ON AN  EMPTY STOMACH WITH WATER  ONLY FOR 30 MIN AND NO  ANTACIDS, CALCIUM,  MAGNESIUM FOR 4 HRS. AVOID  BIOTIN.   metFORMIN (GLUCOPHAGE) 1000 MG tablet, TAKE 1 TABLET BY MOUTH TWICE  DAILY FOR DIABETES   OZEMPIC, 2 MG/DOSE, 8 MG/3ML SOPN, INJECT 2MG  INTO THE SKIN ONCE A WEEK  Current Outpatient Medications (Cardiovascular):    olmesartan-hydrochlorothiazide (BENICAR HCT) 40-25 MG tablet, TAKE 1 TABLET BY MOUTH  DAILY FOR BLOOD PRESSURE ,  REPLACES LOSARTAN/HCTZ  TABLET   spironolactone (ALDACTONE) 50 MG tablet, Take 1 tablet (50 mg total) by mouth daily. Take one tablet daily with a full glass of water   Current Outpatient Medications (Analgesics):    ibuprofen (ADVIL) 800 MG tablet, Take  1/2 to 1 tablet  3 x /day with Food ( every 8 hours) as needed                                                         /                                               TAKE                                    BY  MOUTH   Rimegepant Sulfate (NURTEC) 75 MG TBDP, Take 1 tab under tongue once daily as needed with onset of migraine.   Current Outpatient Medications (Other):    azelaic acid (AZELEX) 20 % cream, Apply topically 2 (two) times daily. After skin is thoroughly washed and patted dry, gently but thoroughly massage a thin film of azelaic acid cream into the affected area twice daily, in the morning and evening.   clindamycin (CLEOCIN-T) 1 % lotion, Apply topically in the morning. To face   clindamycin (CLEOCIN-T) 1 % lotion, Apply topically daily. Apply every morning   COLLAGEN PO, Take by mouth.   dicyclomine (BENTYL) 10 MG capsule, TAKE 1 CAPSULE BY MOUTH THREE TIMES DAILY AS NEEDED FOR ABDOMINAL PAIN OR CRAMPING OR DIARRHEA   doxycycline (MONODOX) 100 MG capsule, Take 1 capsule (100 mg total) by mouth in the morning and at bedtime. For 5 days as needed for flares   famotidine (PEPCID) 20 MG tablet, Take 1 tablet (20 mg total) by mouth 2 (two) times daily.   fluconazole (DIFLUCAN) 150 MG tablet, Take 1 tablet (150 mg total) by mouth daily. As needed for symptoms of yeast infection.   gabapentin (NEURONTIN) 300 MG capsule, Take 1 capsule (300 mg total) by mouth 3 (three) times daily.   GARLIC PO, Take 1 tablet by mouth 2 (two) times daily.   lisdexamfetamine (VYVANSE) 30 MG capsule, Take 1 capsule (30 mg total) by mouth daily.   metroNIDAZOLE (METROGEL) 0.75 % gel, Apply 1 Application topically 2 (two) times daily.   Multiple Vitamins-Minerals (MULTIPLE VITAMINS/WOMENS PO), Take by mouth.   OVER THE COUNTER MEDICATION, Sea moss   pantoprazole (PROTONIX) 40 MG tablet, TAKE 1 TABLET(40 MG) BY MOUTH TWICE DAILY   rifaximin (XIFAXAN) 550 MG TABS tablet, Take 1 tablet (550 mg total) by mouth 2 (two) times daily.   temazepam (RESTORIL) 30 MG capsule, TAKE 1 CAPSULE BY MOUTH EVERY NIGHT AT BEDTIME AS NEEDED FOR INSOMNIA   tretinoin (RETIN-A) 0.025 % cream, Use pea sized amount to  face 3 times/week.(M-W-F)   tretinoin (RETIN-A) 0.05 % cream, Apply topically at bedtime. Apply a pea sized amount 3 nights weekly   triamcinolone cream (KENALOG) 0.1 %, Apply 1 Application topically 2 (two) times daily.   valACYclovir (VALTREX) 500 MG tablet, TAKE 1 TABLET BY MOUTH  TWICE DAILY AS NEEDED FOR  COLD SORES / FEVER BLISTERS  Medical History:  Past Medical History:  Diagnosis Date   Arthritis    Constipation, chronic    Diverticulitis    Diverticulosis    Fibroid uterus    History of gallstones    Hyperlipidemia    Hypertension    Hypothyroidism    Migraine    PCOS (polycystic ovarian syndrome)    takes metformin for this   Prediabetes    Thyroid disease    Allergies No Known Allergies  SURGICAL HISTORY She  has a past surgical history that includes Breast reduction surgery (1988); Cholecystectomy (1995); Tonsillectomy and adenoidectomy (1990's); LASIK (Left); Refractive surgery (Left); Breast surgery; and Colonoscopy. FAMILY HISTORY Her family history includes COPD in her mother; Diabetes in her mother and sister; Diverticulitis in her mother; Heart disease in her mother; Leukemia in her mother; Lung cancer in her mother. SOCIAL HISTORY She  reports that she has never smoked. She has never used smokeless tobacco. She reports current alcohol use. She reports that she does not use drugs.   Review of Systems: Review of Systems  Constitutional:  Negative for  malaise/fatigue and weight loss.  HENT:  Negative for congestion, hearing loss, sore throat and tinnitus.        Dry mouth with Vyvanse  Eyes:  Negative for blurred vision and double vision.  Respiratory:  Negative for cough, shortness of breath and wheezing.   Cardiovascular:  Negative for chest pain, palpitations, orthopnea, claudication and leg swelling.  Gastrointestinal:  Positive for constipation. Negative for abdominal pain, blood in stool, diarrhea, heartburn, melena, nausea and vomiting.   Genitourinary: Negative.   Musculoskeletal:  Positive for joint pain (left foot). Negative for myalgias.  Skin:  Negative for rash.       Mosquito bite reaction  Neurological:  Negative for dizziness, tingling, sensory change, weakness and headaches.  Endo/Heme/Allergies:  Negative for polydipsia.  Psychiatric/Behavioral: Negative.  Negative for depression and substance abuse. The patient is not nervous/anxious.   All other systems reviewed and are negative.   Physical Exam: Estimated body mass index is 33.33 kg/m as calculated from the following:   Height as of 06/02/22: 4' 11.25" (1.505 m).   Weight as of 06/02/22: 166 lb 6.4 oz (75.5 kg). There were no vitals taken for this visit.  General Appearance: Well nourished well developed, in no apparent distress.  Eyes: PERRLA, EOMs, conjunctiva no swelling or erythema ENT/Mouth: Ear canals normal without obstruction, swelling, erythema, or discharge.  TMs normal bilaterally with no erythema, bulging, retraction, or loss of landmark.  Oropharynx moist and clear with no exudate, erythema, or swelling.   Neck: Supple, thyroid nodule left side. No bruits.  No cervical adenopathy Respiratory: Respiratory effort normal, Breath sounds clear A&P without wheeze, rhonchi, rales.   Cardio: RRR without murmurs, rubs or gallops. Brisk peripheral pulses without edema.  Chest: symmetric, with normal excursions Abdomen: Soft, obese, non-distended, vague discomfort with LLQ palpation, no guarding, rebound, hernias, palpable masses, or organomegaly.  Lymphatics: Non tender without lymphadenopathy.  Musculoskeletal: Full ROM all peripheral extremities,5/5 strength, and normal gait. Skin: Warm, dry . 2-3 small blackened areas where she has been bitten by mosquitoes Neuro: Awake and oriented X 3, Cranial nerves intact, reflexes equal bilaterally. Normal muscle tone, no cerebellar symptoms. Sensation intact.  Psych:  normal affect, Insight and Judgment  appropriate.   Raynelle Dick, NP 1:21 PM [facility]

## 2022-12-02 ENCOUNTER — Encounter: Payer: Self-pay | Admitting: Nurse Practitioner

## 2022-12-02 ENCOUNTER — Ambulatory Visit (INDEPENDENT_AMBULATORY_CARE_PROVIDER_SITE_OTHER): Payer: 59 | Admitting: Nurse Practitioner

## 2022-12-02 DIAGNOSIS — R519 Headache, unspecified: Secondary | ICD-10-CM

## 2022-12-02 DIAGNOSIS — E039 Hypothyroidism, unspecified: Secondary | ICD-10-CM

## 2022-12-02 DIAGNOSIS — E782 Mixed hyperlipidemia: Secondary | ICD-10-CM

## 2022-12-02 DIAGNOSIS — R7309 Other abnormal glucose: Secondary | ICD-10-CM

## 2022-12-02 DIAGNOSIS — G4700 Insomnia, unspecified: Secondary | ICD-10-CM

## 2022-12-02 DIAGNOSIS — I10 Essential (primary) hypertension: Secondary | ICD-10-CM

## 2022-12-02 DIAGNOSIS — E559 Vitamin D deficiency, unspecified: Secondary | ICD-10-CM

## 2022-12-02 DIAGNOSIS — E669 Obesity, unspecified: Secondary | ICD-10-CM

## 2022-12-02 DIAGNOSIS — I700 Atherosclerosis of aorta: Secondary | ICD-10-CM | POA: Diagnosis not present

## 2022-12-02 DIAGNOSIS — B3731 Acute candidiasis of vulva and vagina: Secondary | ICD-10-CM

## 2022-12-02 DIAGNOSIS — R7989 Other specified abnormal findings of blood chemistry: Secondary | ICD-10-CM

## 2022-12-02 DIAGNOSIS — M898X8 Other specified disorders of bone, other site: Secondary | ICD-10-CM

## 2022-12-02 DIAGNOSIS — Z79899 Other long term (current) drug therapy: Secondary | ICD-10-CM

## 2022-12-02 DIAGNOSIS — N182 Chronic kidney disease, stage 2 (mild): Secondary | ICD-10-CM

## 2022-12-02 LAB — CBC WITH DIFFERENTIAL/PLATELET
Absolute Monocytes: 825 cells/uL (ref 200–950)
Basophils Absolute: 68 cells/uL (ref 0–200)
Basophils Relative: 0.9 %
Eosinophils Absolute: 255 cells/uL (ref 15–500)
Eosinophils Relative: 3.4 %
HCT: 40.2 % (ref 35.0–45.0)
Hemoglobin: 13.2 g/dL (ref 11.7–15.5)
Lymphs Abs: 1853 cells/uL (ref 850–3900)
MCH: 31.1 pg (ref 27.0–33.0)
MCHC: 32.8 g/dL (ref 32.0–36.0)
MCV: 94.6 fL (ref 80.0–100.0)
MPV: 9.6 fL (ref 7.5–12.5)
Monocytes Relative: 11 %
Neutro Abs: 4500 cells/uL (ref 1500–7800)
Neutrophils Relative %: 60 %
Platelets: 354 10*3/uL (ref 140–400)
RBC: 4.25 10*6/uL (ref 3.80–5.10)
RDW: 13.1 % (ref 11.0–15.0)
Total Lymphocyte: 24.7 %
WBC: 7.5 10*3/uL (ref 3.8–10.8)

## 2022-12-02 MED ORDER — FLUCONAZOLE 150 MG PO TABS
150.0000 mg | ORAL_TABLET | Freq: Once | ORAL | 0 refills | Status: AC
Start: 2022-12-02 — End: 2022-12-02

## 2022-12-02 MED ORDER — IBUPROFEN 800 MG PO TABS
ORAL_TABLET | ORAL | 2 refills | Status: AC
Start: 2022-12-02 — End: ?

## 2022-12-02 NOTE — Patient Instructions (Signed)

## 2022-12-04 ENCOUNTER — Other Ambulatory Visit: Payer: Self-pay | Admitting: Nurse Practitioner

## 2022-12-04 DIAGNOSIS — G4700 Insomnia, unspecified: Secondary | ICD-10-CM

## 2022-12-15 ENCOUNTER — Ambulatory Visit
Admission: RE | Admit: 2022-12-15 | Discharge: 2022-12-15 | Disposition: A | Discharge status: Home / Self Care | Payer: 59 | Source: Ambulatory Visit | Attending: Nurse Practitioner | Admitting: Nurse Practitioner

## 2022-12-15 DIAGNOSIS — M898X8 Other specified disorders of bone, other site: Secondary | ICD-10-CM

## 2022-12-30 ENCOUNTER — Ambulatory Visit (INDEPENDENT_AMBULATORY_CARE_PROVIDER_SITE_OTHER): Payer: 59 | Admitting: Dermatology

## 2022-12-30 DIAGNOSIS — L700 Acne vulgaris: Secondary | ICD-10-CM | POA: Diagnosis not present

## 2022-12-30 DIAGNOSIS — L810 Postinflammatory hyperpigmentation: Secondary | ICD-10-CM

## 2022-12-30 NOTE — Progress Notes (Signed)
   Follow-Up Visit   Subjective  Kelly Goodwin is a 62 y.o. female who presents for the following: Acne  Patient present today for follow up visit for acne. Patient was last evaluated on 09/30/22. Patient reports sxs are Better/not at goal/ resolved. Patient reports no medication changes. Patient's current regimen includes Doxycycline 100mg  BID for five days PRN for flares. She is taking it about 2-3 times weekly. She is applying Tretinoin 0.025% pea sized amount at night 3x weekly after washing with Cetaphil and using a thin layer of moisturizer. She is using Azelaic acid every other night when not using Tretinoin, she applies Clindamycin lotion in the mornings. She reports she has had 3 flares since her previous visit.  The following portions of the chart were reviewed this encounter and updated as appropriate: medications, allergies, medical history  Review of Systems:  No other skin or systemic complaints except as noted in HPI or Assessment and Plan.  Objective  Well appearing patient in no apparent distress; mood and affect are within normal limits.  A focused examination was performed of the following areas: Face  Relevant exam findings are noted in the Assessment and Plan.        Assessment & Plan   ACNE VULGARIS Exam: Open comedones and inflammatory papules  Well Controlled  Treatment Plan: - Continue Doxycycline PRN for Flares - Continue alternating Azelic Acid and Tretinoin - Continue Clindamycin lotion in the morning after you wash - We will plan follow up 6 months   2. Skin Aging and Fine Lines - Assessment: Patient is using tretinoin cream for its anti-aging properties. - Plan: Continue encouraging the use of tretinoin cream. Discuss the option of Botox treatment for fine lines in the future if the patient is interested.   2. Dilated Pore and Blackheads on the Back, Arm, and Chest - Assessment: Presence of dilated pores and blackheads on the back,  arm, and chest. - Plan: Extract blackheads during the visit with a comedone extractor. Instruct the patient to apply tretinoin cream to affected areas to slow down pore filling. Consider incorporating witch hazel into the skincare routine, monitoring for excessive dryness.  Follow-up: Schedule a follow-up in March to reassess the patient's skin condition and discuss any potential changes in the treatment plan.  Return in about 6 months (around 06/29/2023) for Acne F/U.   Documentation: I have reviewed the above documentation for accuracy and completeness, and I agree with the above.  Stasia Cavalier, am acting as scribe for Langston Reusing, DO.  Langston Reusing, DO

## 2022-12-30 NOTE — Patient Instructions (Addendum)
Hello Kelly Goodwin,  Thank you for visiting [place] today. We appreciate your dedication to maintaining and enhancing your skin health. Below is a summary of the essential instructions from today's consultation:  - Tretinoin Cream: Continue applying it at least twice a week to prevent acne and support anti-aging by stimulating collagen production. This remains important even when breakouts seem controlled.   - Frequency: At least two times a week.  - Doxycycline: Keep taking as needed for flare-ups. Your regimen of daily use for a couple of weeks, followed by a reduction to two to three times a week, is appropriate.   - Usage: As needed for flare-ups.  - Clindamycin Lotion: Incorporate this into your morning routine consistently.   - Application: Every morning.  - Adjustments for Dry/Peeling Skin: Should your skin become dry or start peeling, particularly in December, consider reducing Tretinoin usage to twice a week or enhancing your moisturizer's thickness.   - Adaptation: Reduce Tretinoin use or increase moisturizer thickness as needed.  - Spot Treatment: Apply Tretinoin specifically to areas prone to blackheads or where pores are noticeable. This approach helps slow pore filling but does not permanently shrink them.   - Targeted Application: On areas prone to blackheads or visible pores.  - Follow-Up Appointment: Schedule a return visit in March for a potential upgrade to a stronger Tretinoin formula as the weather warms.   - Next Visit: March for Tretinoin formulation reassessment.  Please adhere to your current skincare regimen and feel free to reach out if you have any questions or require additional support before your next appointment.  Warm regards,  Dr. Langston Reusing,  Dermatologist     Important Information  Due to recent changes in healthcare laws, you may see results of your pathology and/or laboratory studies on MyChart before the doctors have had a chance to review them. We  understand that in some cases there may be results that are confusing or concerning to you. Please understand that not all results are received at the same time and often the doctors may need to interpret multiple results in order to provide you with the best plan of care or course of treatment. Therefore, we ask that you please give [place] 2 business days to thoroughly review all your results before contacting the office for clarification. Should we see a critical lab result, you will be contacted sooner.   If You Need Anything After Your Visit  If you have any questions or concerns for your doctor, please call our main line at [phone] If no one answers, please leave a voicemail as directed and we will return your call as soon as possible. Messages left after 4 pm will be answered the following business day.   You may also send [place] a message via MyChart. We typically respond to MyChart messages within 1-2 business days.  For prescription refills, please ask your pharmacy to contact our office. Our fax number is [phone].  If you have an urgent issue when the clinic is closed that cannot wait until the next business day, you can page your doctor at the number below.    Please note that while we do our best to be available for urgent issues outside of office hours, we are not available 24/7.   If you have an urgent issue and are unable to reach [place], you may choose to seek medical care at your doctor's office, retail clinic, urgent care center, or emergency room.  If you have a medical emergency,  please immediately call 911 or go to the emergency department. In the event of inclement weather, please call our main line at [phone] for an update on the status of any delays or closures.  Dermatology Medication Tips: Please keep the boxes that topical medications come in in order to help keep track of the instructions about where and how to use these. Pharmacies typically print the medication  instructions only on the boxes and not directly on the medication tubes.   If your medication is too expensive, please contact our office at [phone] or send [place] a message through MyChart.   We are unable to tell what your co-pay for medications will be in advance as this is different depending on your insurance coverage. However, we may be able to find a substitute medication at lower cost or fill out paperwork to get insurance to cover a needed medication.   If a prior authorization is required to get your medication covered by your insurance company, please allow [place] 1-2 business days to complete this process.  Drug prices often vary depending on where the prescription is filled and some pharmacies may offer cheaper prices.  The website www.goodrx.com contains coupons for medications through different pharmacies. The prices here do not account for what the cost may be with help from insurance (it may be cheaper with your insurance), but the website can give you the price if you did not use any insurance.  - You can print the associated coupon and take it with your prescription to the pharmacy.  - You may also stop by our office during regular business hours and pick up a GoodRx coupon card.  - If you need your prescription sent electronically to a different pharmacy, notify our office through [facility] or by phone at [phone]

## 2022-12-31 ENCOUNTER — Encounter: Payer: Self-pay | Admitting: Dermatology

## 2023-01-05 ENCOUNTER — Other Ambulatory Visit: Payer: Self-pay | Admitting: Internal Medicine

## 2023-01-05 MED ORDER — LEVOTHYROXINE SODIUM 100 MCG PO TABS: ORAL_TABLET | ORAL | 3 refills | Status: AC

## 2023-01-06 ENCOUNTER — Telehealth: Payer: Self-pay | Admitting: Nurse Practitioner

## 2023-01-06 ENCOUNTER — Other Ambulatory Visit: Payer: Self-pay | Admitting: Nurse Practitioner

## 2023-01-06 DIAGNOSIS — G4700 Insomnia, unspecified: Secondary | ICD-10-CM

## 2023-01-06 MED ORDER — TEMAZEPAM 30 MG PO CAPS
ORAL_CAPSULE | ORAL | 0 refills | Status: DC
Start: 2023-01-06 — End: 2023-02-04

## 2023-01-06 NOTE — Telephone Encounter (Signed)
Refill request on temazepam. Pls send to [facility] on file.

## 2023-01-15 ENCOUNTER — Other Ambulatory Visit: Payer: Self-pay | Admitting: Nurse Practitioner

## 2023-02-04 ENCOUNTER — Telehealth: Payer: Self-pay | Admitting: Nurse Practitioner

## 2023-02-04 ENCOUNTER — Other Ambulatory Visit: Payer: Self-pay | Admitting: Nurse Practitioner

## 2023-02-04 DIAGNOSIS — G4700 Insomnia, unspecified: Secondary | ICD-10-CM

## 2023-02-04 MED ORDER — TEMAZEPAM 30 MG PO CAPS: ORAL_CAPSULE | ORAL | 0 refills | Status: DC

## 2023-02-04 NOTE — Telephone Encounter (Signed)
Patient is requesting a refill on Temazepam to [facility] on [address]

## 2023-02-15 ENCOUNTER — Other Ambulatory Visit: Payer: Self-pay | Admitting: Nurse Practitioner

## 2023-02-15 DIAGNOSIS — R7309 Other abnormal glucose: Secondary | ICD-10-CM

## 2023-02-23 ENCOUNTER — Other Ambulatory Visit: Payer: Self-pay | Admitting: Nurse Practitioner

## 2023-02-23 ENCOUNTER — Telehealth: Payer: Self-pay | Admitting: Nurse Practitioner

## 2023-02-23 MED ORDER — ESTRADIOL-NORETHINDRONE ACET 1-0.5 MG PO TABS: 1.0000 | ORAL_TABLET | Freq: Every day | ORAL | 3 refills | Status: DC

## 2023-02-23 NOTE — Telephone Encounter (Signed)
Pt is requesting refill on ACTIVELLA. Pls send 1 month script to  [id number] - [place], [place] - [address] AT [facility] OF [address]

## 2023-03-04 NOTE — Progress Notes (Deleted)
3 MONTH FOLLOW UP   Assessment and Plan:   Aortic atherosclerosis (HCC) Per CT Abdomen 09/2019 Control blood pressure, cholesterol, glucose, increase exercise.   Essential hypertension - continue medications, DASH diet, exercise and monitor at home. Call if greater than 130/80.  -     CBC with Diff -     COMPLETE METABOLIC PANEL WITH GFR  Mixed hyperlipidemia decrease fatty foods (saturated fat) increase fiber increase activity. -     Lipid Profile  Abnormal glucose  Discussed disease and risks Discussed diet/exercise, weight management  Check A1C q3m; defer this visit - patient has lost weight, now in normal range, will continue to monitor  Hypothyroidism, unspecified type Continue current medications Reminder to take on an empty stomach 30-72mins before first meal of the day. No antacid medications for 4 hours. -     TSH  CKD stage 2 (Mild) Increase fluids, avoid NSAIDS, monitor sugars, will monitor  -CMP  Obesity - BMI 32 with comorbidities Has + binge eating; vyvanse- does give dry mouth.  Insurance would no longer pay for Vyvanse Has tried and failed phentermine, contrave, qysema, belviq, can not afford saxenda, topamax had AE's.  Improved ozempic Continue to increase activity and decrease saturated fats and simple carbs  Medication management -     Magnesium  Vitamin D deficiency Continue Vit D supplementation  Insomnia Continue Temazepam nightly as needed Practice good sleep hygiene    Discussed med's effects and SE's.  Further disposition pending results if labs check today. Discussed med's effects and SE's.   Over 30 minutes of face to face interview, exam, counseling, chart review, and critical decision making was performed.   Future Appointments  Date Time Provider Department Center  03/05/2023  4:00 PM Raynelle Dick, NP [facility] None  06/04/2023  3:00 PM Raynelle Dick, NP [facility] None  06/29/2023  4:15 PM Terri Piedra, DO [facility]  None      HPI  62 y.o. female  presents for 6 month follow up on htn, hyperlipidemia, PCOS/anormal glucose, obesity, vitamin D.    She has IBS C/D, hx of diverticulosis, prone to constipation. She is currently on Rifaximin, had run out and is no longer taking,  She uses laxatives, miralax daily and tea.   BMI is There is no height or weight on file to calculate BMI., she has been working on diet, trying to make better choices . Limited benefit with phentermine, belvia, qysima, topiramate (SE), contrave, cost barrier with saxenda. Some benefit with vyvanse for binge eating behaviors but not currently taking. Currently on Ozempic 2 mg SQ QW. She is down 5 pounds from February. She is walking 2- 3 miles a day and doing Zumba classes.  Wt Readings from Last 3 Encounters:  12/02/22 161 lb (73 kg)  06/02/22 166 lb 6.4 oz (75.5 kg)  03/17/22 163 lb 6.4 oz (74.1 kg)   Her blood pressure has been controlled at home,  Benicar HCT 40/25 mg every day, spironolactone 50 mg PRN , today their BP is  . She is walking 2-3 miles 4 times a week and doing weight twice a week.  BP Readings from Last 3 Encounters:  12/02/22 102/60  09/30/22 112/72  06/30/22 135/85   She does not workout. She denies chest pain, shortness of breath, dizziness.    She is not on cholesterol medication . Her cholesterol is not at goal. She has not been decreasing meat, dairy, eggs. The cholesterol last visit was:  Lab Results  Component Value Date   CHOL 187 12/02/2022   HDL 61 12/02/2022   LDLCALC 105 (H) 12/02/2022   TRIG 109 12/02/2022   CHOLHDL 3.1 12/02/2022  . She has been working on diet and exercise for PCOS/abnormal glucose, was on metformin 2000 mg since her 20's, and on ozempic 2 mg SQ QW she is on bASA, she is on ACE/ARB and denies foot ulcerations, hyperglycemia, hypoglycemia , increased appetite, nausea, paresthesia of the feet, polydipsia, polyuria, visual disturbances, vomiting and weight loss. Last A1C in the  office was:  Lab Results  Component Value Date   HGBA1C 5.4 06/02/2022   Patient is on Vitamin D supplement, reports was taking high dose supplement previous, now taking calcium+ D3 supplement only, Lab Results  Component Value Date   VD25OH 111 (H) 06/02/2022     She is on thyroid medication, levothyroxine 100 mcg daily. Her medication was not changed last visit.  She is alternating 100 mcg and 50 mcg every other day, hasn't changed dose recently.  Lab Results  Component Value Date   TSH 1.24 12/02/2022   She has been noticing itching and irritation with thick white discharge x 2-3 days.    Current Medications:   Current Outpatient Medications (Endocrine & Metabolic):    estradiol-norethindrone (ACTIVELLA) 1-0.5 MG tablet, Take 1 tablet by mouth daily.   levothyroxine (SYNTHROID) 100 MCG tablet, TAKE 1 TABLET DAILY ON AN EMPTY STOMACH WITH WATER ONLY FOR 30 MIN AND NO ANTACIDS, CALCIUM, MAGNESIUM FOR 4 HRS. AVOID BIOTIN.   metFORMIN (GLUCOPHAGE) 1000 MG tablet, TAKE 1 TABLET BY MOUTH TWICE  DAILY FOR DIABETES   OZEMPIC, 2 MG/DOSE, 8 MG/3ML SOPN, INJECT 2 MG INTO SKIJN ONCE WEEKLY  Current Outpatient Medications (Cardiovascular):    olmesartan-hydrochlorothiazide (BENICAR HCT) 40-25 MG tablet, TAKE 1 TABLET BY MOUTH  DAILY FOR BLOOD PRESSURE ,  REPLACES LOSARTAN/HCTZ  TABLET   spironolactone (ALDACTONE) 50 MG tablet, Take 1 tablet (50 mg total) by mouth daily. Take one tablet daily with a full glass of water   Current Outpatient Medications (Analgesics):    ibuprofen (ADVIL) 800 MG tablet, Take  1/2 to 1 tablet  3 x /day with Food ( every 8 hours) as needed                                                         /                                               TAKE                                    BY                                                                            MOUTH   Rimegepant  Sulfate (NURTEC) 75 MG TBDP, Take 1 tab under tongue once daily as needed with onset of  migraine.  Current Outpatient Medications (Hematological):    folic acid (FOLVITE) 1 MG tablet, Take 1 mg by mouth daily.  Current Outpatient Medications (Other):    azelaic acid (AZELEX) 20 % cream, Apply topically 2 (two) times daily. After skin is thoroughly washed and patted dry, gently but thoroughly massage a thin film of azelaic acid cream into the affected area twice daily, in the morning and evening.   clindamycin (CLEOCIN-T) 1 % lotion, Apply topically in the morning. To face   clindamycin (CLEOCIN-T) 1 % lotion, Apply topically daily. Apply every morning   COLLAGEN PO, Take by mouth.   dicyclomine (BENTYL) 10 MG capsule, TAKE 1 CAPSULE BY MOUTH THREE TIMES DAILY AS NEEDED FOR ABDOMINAL PAIN OR CRAMPING OR DIARRHEA   famotidine (PEPCID) 20 MG tablet, Take 1 tablet (20 mg total) by mouth 2 (two) times daily.   fluconazole (DIFLUCAN) 150 MG tablet, Take 150 mg by mouth once.   gabapentin (NEURONTIN) 300 MG capsule, Take 1 capsule (300 mg total) by mouth 3 (three) times daily.   GARLIC PO, Take 1 tablet by mouth 2 (two) times daily.   metroNIDAZOLE (METROGEL) 0.75 % gel, Apply 1 Application topically 2 (two) times daily.   Multiple Vitamins-Minerals (MULTIPLE VITAMINS/WOMENS PO), Take by mouth.   OVER THE COUNTER MEDICATION, Sea moss   pantoprazole (PROTONIX) 40 MG tablet, TAKE 1 TABLET(40 MG) BY MOUTH TWICE DAILY   rifaximin (XIFAXAN) 550 MG TABS tablet, Take 1 tablet (550 mg total) by mouth 2 (two) times daily.   temazepam (RESTORIL) 30 MG capsule, TAKE 1 CAPSULE BY MOUTH EVERY NIGHT AT BEDTIME AS NEEDED FOR INSOMNIA   tretinoin (RETIN-A) 0.05 % cream, Apply topically at bedtime. Apply a pea sized amount 3 nights weekly   triamcinolone cream (KENALOG) 0.1 %, Apply 1 Application topically 2 (two) times daily.   valACYclovir (VALTREX) 500 MG tablet, TAKE 1 TABLET BY MOUTH  TWICE DAILY AS NEEDED FOR  COLD SORES / FEVER BLISTERS  Medical History:  Past Medical History:  Diagnosis Date    Arthritis    Constipation, chronic    Diverticulitis    Diverticulosis    Fibroid uterus    History of gallstones    Hyperlipidemia    Hypertension    Hypothyroidism    Migraine    PCOS (polycystic ovarian syndrome)    takes metformin for this   Prediabetes    Thyroid disease    Allergies No Known Allergies  SURGICAL HISTORY She  has a past surgical history that includes Breast reduction surgery (1988); Cholecystectomy (1995); Tonsillectomy and adenoidectomy (1990's); LASIK (Left); Refractive surgery (Left); Breast surgery; and Colonoscopy. FAMILY HISTORY Her family history includes COPD in her mother; Diabetes in her mother and sister; Diverticulitis in her mother; Heart disease in her mother; Leukemia in her mother; Lung cancer in her mother. SOCIAL HISTORY She  reports that she has never smoked. She has never used smokeless tobacco. She reports current alcohol use. She reports that she does not use drugs.   Review of Systems: Review of Systems  Constitutional:  Negative for malaise/fatigue and weight loss.  HENT:  Negative for congestion, hearing loss, sore throat and tinnitus.   Eyes:  Negative for blurred vision and double vision.  Respiratory:  Negative for cough, shortness of breath and wheezing.   Cardiovascular:  Negative for chest pain, palpitations, orthopnea, claudication and leg swelling.  Gastrointestinal:  Positive for constipation. Negative for abdominal pain, blood in stool, diarrhea, heartburn, melena, nausea and vomiting.  Genitourinary:        Itching/irritation and thick white vaginal d/c  Musculoskeletal:  Positive for joint pain (left foot). Negative for myalgias.       Pain at base of sternum  Skin:  Negative for rash.  Neurological:  Negative for dizziness, tingling, sensory change, weakness and headaches.  Endo/Heme/Allergies:  Negative for polydipsia.  Psychiatric/Behavioral: Negative.  Negative for depression and substance abuse. The patient is  not nervous/anxious.   All other systems reviewed and are negative.   Physical Exam: Estimated body mass index is 32.24 kg/m as calculated from the following:   Height as of 12/02/22: 4' 11.25" (1.505 m).   Weight as of 12/02/22: 161 lb (73 kg). There were no vitals taken for this visit.  General Appearance: Well nourished well developed, in no apparent distress.  Eyes: PERRLA, EOMs, conjunctiva no swelling or erythema Neck: Supple, thyroid nodule left side. No bruits.  No cervical adenopathy Respiratory: Respiratory effort normal, Breath sounds clear A&P without wheeze, rhonchi, rales.   Cardio: RRR without murmurs, rubs or gallops. Brisk peripheral pulses without edema.  Chest: symmetric, with normal excursions Abdomen: Soft, obese, non-distended, no guarding, rebound, hernias, palpable masses, or organomegaly.  Lymphatics: Non tender without lymphadenopathy.  Musculoskeletal: Full ROM all peripheral extremities,5/5 strength, and normal gait. Tender slightly enlarged area at base of sternum Skin: Warm, dry .No rashes Neuro: Awake and oriented X 3, Cranial nerves intact, reflexes equal bilaterally. Normal muscle tone, no cerebellar symptoms. Sensation intact.  Psych:  normal affect, Insight and Judgment appropriate.   Raynelle Dick, NP 12:55 PM [facility]

## 2023-03-05 ENCOUNTER — Telehealth: Payer: Self-pay | Admitting: Nurse Practitioner

## 2023-03-05 ENCOUNTER — Other Ambulatory Visit: Payer: Self-pay | Admitting: Nurse Practitioner

## 2023-03-05 ENCOUNTER — Ambulatory Visit: Payer: 59 | Admitting: Nurse Practitioner

## 2023-03-05 DIAGNOSIS — E782 Mixed hyperlipidemia: Secondary | ICD-10-CM

## 2023-03-05 DIAGNOSIS — E559 Vitamin D deficiency, unspecified: Secondary | ICD-10-CM

## 2023-03-05 DIAGNOSIS — Z79899 Other long term (current) drug therapy: Secondary | ICD-10-CM

## 2023-03-05 DIAGNOSIS — I700 Atherosclerosis of aorta: Secondary | ICD-10-CM

## 2023-03-05 DIAGNOSIS — I10 Essential (primary) hypertension: Secondary | ICD-10-CM

## 2023-03-05 DIAGNOSIS — G4700 Insomnia, unspecified: Secondary | ICD-10-CM

## 2023-03-05 DIAGNOSIS — R7309 Other abnormal glucose: Secondary | ICD-10-CM

## 2023-03-05 DIAGNOSIS — E039 Hypothyroidism, unspecified: Secondary | ICD-10-CM

## 2023-03-05 DIAGNOSIS — N182 Chronic kidney disease, stage 2 (mild): Secondary | ICD-10-CM

## 2023-03-05 DIAGNOSIS — E66811 Obesity, class 1: Secondary | ICD-10-CM

## 2023-03-05 MED ORDER — TEMAZEPAM 30 MG PO CAPS: ORAL_CAPSULE | ORAL | 0 refills | Status: DC

## 2023-03-05 NOTE — Telephone Encounter (Signed)
Refill on Temazepam. Pls send to [id number] - [place], [place] - [address] AT [facility] OF [address]

## 2023-03-17 NOTE — Progress Notes (Unsigned)
3 MONTH FOLLOW UP   Assessment and Plan:   Aortic atherosclerosis (HCC) Per CT Abdomen 09/2019 Control blood pressure, cholesterol, glucose, increase exercise.   Essential hypertension - continue medications-  Benicar HCT 40/25 mg every day - Continue DASH diet, exercise and monitor at home. Call if greater than 130/80.  -     CBC with Diff -     COMPLETE METABOLIC PANEL WITH GFR  Mixed hyperlipidemia decrease fatty foods (saturated fat) increase fiber increase activity. -     Lipid Profile  Abnormal glucose  Discussed disease and risks Discussed diet/exercise, weight management  Check A1C q51m; defer this visit, check CMP - currently on Ozempic 2 mg SQ QW and Metformin 1000 mg BID  Hypothyroidism, unspecified type Continue current medications- levothyroxine 100 mcg 1 tab alternating 1/2 tab every other day.  Reminder to take on an empty stomach 30-64mins before first meal of the day. No antacid medications for 4 hours. -     TSH  CKD stage 2 (Mild) Increase fluids, avoid NSAIDS, monitor sugars, will monitor  -CMP  Obesity - BMI 33 with comorbidities/Binge eating disorder       Restart Vyvanse 30 mg QD Continue Ozempic 2 mg SQ QW and has gained 4 pounds in the last 3 months.  Has tried and failed phentermine, contrave, qysmia, belviq, can not afford saxenda, topamax had AE's.  Continue to increase activity and decrease saturated fats and simple carbs  Vitamin D deficiency Continue Vit D supplementation  Insomnia Continue Temazepam nightly as needed Practice good sleep hygiene  IBS with constipation/diarrhea - Continue rifaximin, bentyl and Protonix.   Impacted cerumen right ear - Ear lavage performed , TM WNL   Medication management -     CBC with Differential/Platelet -     COMPLETE METABOLIC PANEL WITH GFR -     Magnesium -     Lipid panel -     TSH  Gastroesophageal reflux disease with esophagitis without hemorrhage Continue Protonix daily and dietary  modifications -     pantoprazole (PROTONIX) 40 MG tablet; TAKE 1 TABLET(40 MG) BY MOUTH TWICE DAILY       Discussed med's effects and SE's.  Further disposition pending results if labs check today. Discussed med's effects and SE's.   Over 30 minutes of face to face interview, exam, counseling, chart review, and critical decision making was performed.   Future Appointments  Date Time Provider Department Center  06/04/2023  3:00 PM Raynelle Dick, NP [facility] None  06/29/2023  4:15 PM Terri Piedra, DO [facility] None      HPI  62 y.o. female  presents for 6 month follow up on htn, hyperlipidemia, PCOS/anormal glucose, obesity, vitamin D.    She has IBS C/D, hx of diverticulosis, prone to constipation. She is currently on Rifaximin and Bentyl.  She uses laxatives, miralax daily and tea.   BMI is Body mass index is 33.05 kg/m., she has been working on diet, trying to make better choices . Limited benefit with phentermine, belvia, qysima, topiramate (SE), contrave, cost barrier with saxenda. Some benefit with vyvanse for binge eating behaviors but not currently taking. Currently on Ozempic 2 mg SQ QW. Marland Kitchen She is walking 2- 3 miles a day and doing Zumba classes.  Wt Readings from Last 3 Encounters:  03/18/23 165 lb (74.8 kg)  12/02/22 161 lb (73 kg)  06/02/22 166 lb 6.4 oz (75.5 kg)   Her blood pressure has been controlled at home,  Benicar HCT 40/25 mg every day, spironolactone 50 mg QD , today their BP is BP: 128/62. She is walking 2-3 miles 4 times a week and doing weight twice a week.  BP Readings from Last 3 Encounters:  03/18/23 128/62  12/02/22 102/60  09/30/22 112/72   She does not workout. She denies chest pain, shortness of breath, dizziness.    She is not on cholesterol medication . Her cholesterol is not at goal LDL < 70. She has not been decreasing meat, dairy, eggs. The cholesterol last visit was:  Lab Results  Component Value Date   CHOL 187 12/02/2022   HDL 61  12/02/2022   LDLCALC 105 (H) 12/02/2022   TRIG 109 12/02/2022   CHOLHDL 3.1 12/02/2022  . She has been working on diet and exercise for PCOS/abnormal glucose, was on metformin 2000 mg since her 20's, and on ozempic 2 mg SQ QW she is on bASA, she is on ACE/ARB and denies foot ulcerations, hyperglycemia, hypoglycemia , increased appetite, nausea, paresthesia of the feet, polydipsia, polyuria, visual disturbances, vomiting and weight loss. Last A1C in the office was:  Lab Results  Component Value Date   HGBA1C 5.4 06/02/2022   Patient is on Vitamin D supplement, reports was taking high dose supplement previous, now taking calcium+ D3 supplement only, Lab Results  Component Value Date   VD25OH 111 (H) 06/02/2022     She is on thyroid medication, levothyroxine 100 mcg daily. Her medication was not changed last visit.  She is alternating 100 mcg and 50 mcg every other day, hasn't changed dose recently.  Lab Results  Component Value Date   TSH 1.24 12/02/2022     Current Medications:   Current Outpatient Medications (Endocrine & Metabolic):    levothyroxine (SYNTHROID) 100 MCG tablet, TAKE 1 TABLET DAILY ON AN EMPTY STOMACH WITH WATER ONLY FOR 30 MIN AND NO ANTACIDS, CALCIUM, MAGNESIUM FOR 4 HRS. AVOID BIOTIN.   metFORMIN (GLUCOPHAGE) 1000 MG tablet, TAKE 1 TABLET BY MOUTH TWICE  DAILY FOR DIABETES   OZEMPIC, 2 MG/DOSE, 8 MG/3ML SOPN, INJECT 2 MG INTO SKIJN ONCE WEEKLY   estradiol-norethindrone (ACTIVELLA) 1-0.5 MG tablet, Take 1 tablet by mouth daily.  Current Outpatient Medications (Cardiovascular):    olmesartan-hydrochlorothiazide (BENICAR HCT) 40-25 MG tablet, TAKE 1 TABLET BY MOUTH  DAILY FOR BLOOD PRESSURE ,  REPLACES LOSARTAN/HCTZ  TABLET   Current Outpatient Medications (Analgesics):    ibuprofen (ADVIL) 800 MG tablet, Take  1/2 to 1 tablet  3 x /day with Food ( every 8 hours) as needed                                                         /                                                TAKE                                    BY  MOUTH   Rimegepant Sulfate (NURTEC) 75 MG TBDP, Take 1 tab under tongue once daily as needed with onset of migraine.  Current Outpatient Medications (Hematological):    folic acid (FOLVITE) 1 MG tablet, Take 1 mg by mouth daily.  Current Outpatient Medications (Other):    azelaic acid (AZELEX) 20 % cream, Apply topically 2 (two) times daily. After skin is thoroughly washed and patted dry, gently but thoroughly massage a thin film of azelaic acid cream into the affected area twice daily, in the morning and evening.   clindamycin (CLEOCIN-T) 1 % lotion, Apply topically in the morning. To face   clindamycin (CLEOCIN-T) 1 % lotion, Apply topically daily. Apply every morning   COLLAGEN PO, Take by mouth.   famotidine (PEPCID) 20 MG tablet, Take 1 tablet (20 mg total) by mouth 2 (two) times daily.   gabapentin (NEURONTIN) 300 MG capsule, Take 1 capsule (300 mg total) by mouth 3 (three) times daily.   GARLIC PO, Take 1 tablet by mouth 2 (two) times daily.   lisdexamfetamine (VYVANSE) 30 MG capsule, Take 1 capsule (30 mg total) by mouth daily.   metroNIDAZOLE (METROGEL) 0.75 % gel, Apply 1 Application topically 2 (two) times daily.   Multiple Vitamins-Minerals (MULTIPLE VITAMINS/WOMENS PO), Take by mouth.   OVER THE COUNTER MEDICATION, Sea moss   temazepam (RESTORIL) 30 MG capsule, TAKE 1 CAPSULE BY MOUTH EVERY NIGHT AT BEDTIME AS NEEDED FOR INSOMNIA   tretinoin (RETIN-A) 0.05 % cream, Apply topically at bedtime. Apply a pea sized amount 3 nights weekly   triamcinolone cream (KENALOG) 0.1 %, Apply 1 Application topically 2 (two) times daily.   valACYclovir (VALTREX) 500 MG tablet, TAKE 1 TABLET BY MOUTH  TWICE DAILY AS NEEDED FOR  COLD SORES / FEVER BLISTERS   dicyclomine (BENTYL) 10 MG capsule, TAKE 1 CAPSULE BY MOUTH THREE TIMES DAILY AS NEEDED FOR ABDOMINAL PAIN OR CRAMPING  OR DIARRHEA   fluconazole (DIFLUCAN) 150 MG tablet, Take 150 mg by mouth once.   pantoprazole (PROTONIX) 40 MG tablet, TAKE 1 TABLET(40 MG) BY MOUTH TWICE DAILY   rifaximin (XIFAXAN) 550 MG TABS tablet, Take 1 tablet (550 mg total) by mouth 2 (two) times daily.  Medical History:  Past Medical History:  Diagnosis Date   Arthritis    Constipation, chronic    Diverticulitis    Diverticulosis    Fibroid uterus    History of gallstones    Hyperlipidemia    Hypertension    Hypothyroidism    Migraine    PCOS (polycystic ovarian syndrome)    takes metformin for this   Prediabetes    Thyroid disease    Allergies No Known Allergies  SURGICAL HISTORY She  has a past surgical history that includes Breast reduction surgery (1988); Cholecystectomy (1995); Tonsillectomy and adenoidectomy (1990's); LASIK (Left); Refractive surgery (Left); Breast surgery; and Colonoscopy. FAMILY HISTORY Her family history includes COPD in her mother; Diabetes in her mother and sister; Diverticulitis in her mother; Heart disease in her mother; Leukemia in her mother; Lung cancer in her mother. SOCIAL HISTORY She  reports that she has never smoked. She has never used smokeless tobacco. She reports current alcohol use. She reports that she does not use drugs.   Review of Systems: Review of Systems  Constitutional:  Negative for malaise/fatigue and weight loss.       Binge eating disorder  HENT:  Negative for congestion, hearing loss, sore throat and tinnitus.        Itching of  ears  Eyes:  Negative for blurred vision and double vision.  Respiratory:  Negative for cough, shortness of breath and wheezing.   Cardiovascular:  Negative for chest pain, palpitations, orthopnea, claudication and leg swelling.  Gastrointestinal:  Positive for constipation and diarrhea. Negative for abdominal pain, blood in stool, heartburn, melena, nausea and vomiting.       Abdominal cramping  Musculoskeletal:  Positive for joint  pain (left foot). Negative for myalgias.  Skin:  Negative for rash.  Neurological:  Negative for dizziness, tingling, sensory change, weakness and headaches.  Endo/Heme/Allergies:  Negative for polydipsia.  Psychiatric/Behavioral: Negative.  Negative for depression and substance abuse. The patient is not nervous/anxious.   All other systems reviewed and are negative.   Physical Exam: Estimated body mass index is 33.05 kg/m as calculated from the following:   Height as of this encounter: 4' 11.25" (1.505 m).   Weight as of this encounter: 165 lb (74.8 kg). BP 128/62   Pulse 91   Temp (!) 97.3 F (36.3 C)   Ht 4' 11.25" (1.505 m)   Wt 165 lb (74.8 kg)   SpO2 98%   BMI 33.05 kg/m   General Appearance: Pleasant obese female, in no apparent distress.  Eyes: PERRLA, EOMs, conjunctiva no swelling or erythema ENT: L TM no bulging or erythema, R TM dried cerumen obscuring TM- ear lavage performed TM wnl, No erythema or drainage of post pharynx, hearing normal Neck: Supple, thyroid normal Respiratory: Respiratory effort normal, Breath sounds clear A&P without wheeze, rhonchi, rales.   Cardio: RRR without murmurs, rubs or gallops. Brisk peripheral pulses without edema.  Chest: symmetric, with normal excursions Abdomen: Soft, obese, non-distended Lymphatics: Non tender without lymphadenopathy.  Musculoskeletal: Full ROM all peripheral extremities,5/5 strength, and normal gait.  Skin: Warm, dry .No rashes Neuro: Awake and oriented X 3, Cranial nerves intact, reflexes equal bilaterally. Normal muscle tone, no cerebellar symptoms. Sensation intact.  Psych:  normal affect, Insight and Judgment appropriate.   Raynelle Dick, NP 4:53 PM [facility]

## 2023-03-18 ENCOUNTER — Ambulatory Visit (INDEPENDENT_AMBULATORY_CARE_PROVIDER_SITE_OTHER): Payer: 59 | Admitting: Nurse Practitioner

## 2023-03-18 ENCOUNTER — Encounter: Payer: Self-pay | Admitting: Nurse Practitioner

## 2023-03-18 VITALS — BP 128/62 | HR 91 | Temp 97.3°F | Ht 59.25 in | Wt 165.0 lb

## 2023-03-18 DIAGNOSIS — N182 Chronic kidney disease, stage 2 (mild): Secondary | ICD-10-CM

## 2023-03-18 DIAGNOSIS — H6121 Impacted cerumen, right ear: Secondary | ICD-10-CM | POA: Diagnosis not present

## 2023-03-18 DIAGNOSIS — R7309 Other abnormal glucose: Secondary | ICD-10-CM | POA: Diagnosis not present

## 2023-03-18 DIAGNOSIS — I10 Essential (primary) hypertension: Secondary | ICD-10-CM | POA: Diagnosis not present

## 2023-03-18 DIAGNOSIS — E782 Mixed hyperlipidemia: Secondary | ICD-10-CM | POA: Diagnosis not present

## 2023-03-18 DIAGNOSIS — G4700 Insomnia, unspecified: Secondary | ICD-10-CM

## 2023-03-18 DIAGNOSIS — E66811 Obesity, class 1: Secondary | ICD-10-CM

## 2023-03-18 DIAGNOSIS — E039 Hypothyroidism, unspecified: Secondary | ICD-10-CM | POA: Diagnosis not present

## 2023-03-18 DIAGNOSIS — K582 Mixed irritable bowel syndrome: Secondary | ICD-10-CM

## 2023-03-18 DIAGNOSIS — Z79899 Other long term (current) drug therapy: Secondary | ICD-10-CM

## 2023-03-18 DIAGNOSIS — K2100 Gastro-esophageal reflux disease with esophagitis, without bleeding: Secondary | ICD-10-CM

## 2023-03-18 DIAGNOSIS — I700 Atherosclerosis of aorta: Secondary | ICD-10-CM

## 2023-03-18 DIAGNOSIS — E6609 Other obesity due to excess calories: Secondary | ICD-10-CM

## 2023-03-18 DIAGNOSIS — E559 Vitamin D deficiency, unspecified: Secondary | ICD-10-CM

## 2023-03-18 DIAGNOSIS — F50812 Binge eating disorder, severe: Secondary | ICD-10-CM

## 2023-03-18 MED ORDER — PANTOPRAZOLE SODIUM 40 MG PO TBEC: DELAYED_RELEASE_TABLET | ORAL | 3 refills | Status: DC

## 2023-03-18 MED ORDER — ESTRADIOL-NORETHINDRONE ACET 1-0.5 MG PO TABS: 1.0000 | ORAL_TABLET | Freq: Every day | ORAL | 3 refills | Status: AC

## 2023-03-18 MED ORDER — LISDEXAMFETAMINE DIMESYLATE 30 MG PO CAPS: 30.0000 mg | ORAL_CAPSULE | Freq: Every day | ORAL | 0 refills | Status: DC

## 2023-03-18 MED ORDER — DICYCLOMINE HCL 10 MG PO CAPS: ORAL_CAPSULE | ORAL | 1 refills | Status: DC

## 2023-03-18 NOTE — Patient Instructions (Signed)
Binge-Eating Disorder Binge-eating disorder is a condition that involves repeated episodes of binge-eating. Binge-eating refers to eating a larger-than-normal amount of food in a short period of time, usually within 2 hours. People with this condition may eat even when they are not hungry, and they do not stop eating even when they feel full. People with binge-eating disorder feel unable to control their eating. Although they feel bad about overeating, they usually do not try to undo the bingeing by using laxatives or making themselves vomit. They do not starve themselves or exercise too much. Binge-eating disorder usually starts in the teenage years or early 16s. It often gets worse with stress. What are the causes? The cause of this condition is not known. However, it may be influenced by: Having a family history of eating disorders. Factors that are inherited from family (genetics). Experiencing trauma. Having low self-esteem or poor body image. Having other mental health issues. What increases the risk? The following factors may make you more likely to develop this condition: Being a teenager or in your early 81s. Being female. Binge-eating disorder can affect males, but it is more common in females. Being overweight or obese. Having a mental health disorder, such as depression or anxiety. Having a substance use disorder, such as alcohol use disorder. Having a history of unhealthy dieting, such as meal skipping, yo-yo dieting, food restricting, or avoiding certain kinds of foods. What are the signs or symptoms? Symptoms of this condition include: Eating much more quickly than normal. Eating to the point of feeling physically uncomfortable. Eating large amounts of food when you are not hungry. Eating alone because you are embarrassed about how much you are eating. Feeling disgusted, depressed, or guilty after overeating. How is this diagnosed? This condition is diagnosed through an  assessment by your health care provider. You may be diagnosed with the disorder if you: Binge-eat an average of one or more times a week for three months or longer. Have three or more of the symptoms of the disorder. Once you have been diagnosed, your level of binge-eating disorder will be rated from mild to severe. The rating is based on how often you binge-eat. How is this treated? This condition may be treated with: Cognitive behavioral therapy (CBT). This is a form of talk therapy that helps you recognize the thoughts, beliefs, and emotions that contribute to overeating. It also helps you change them. Interpersonal psychotherapy. This is a form of talk therapy that focuses on fixing relationship problems that trigger binge-eating episodes. Dialectical behavioral therapy (DBT). This is a form of talk therapy that helps you learn skills to control your emotions and tolerate distress without binge-eating. Medicine. Treatment is usually provided by mental health professionals, such as psychologists, psychiatrists, licensed professional counselors, and clinical social workers. Follow these instructions at home: Lifestyle     Work to develop a healthy relationship with food. Talk with your health care provider or a nutrition specialist (dietitian). He or she can provide guidance about healthy eating and healthy lifestyle choices. Eat a healthy diet that consists of lean meats and low-fat dairy products, as well as foods that are high in fiber, such as fresh fruits and vegetables, whole grains, and beans. Start an exercise routine and stay active. Aim for 30 minutes of exercise a day on 5 or more days a week to keep your body strong and healthy. You may need to exercise more if you want to lose weight. Talk with your health care provider about how much  and what type of exercise you can do. Some ways to be active include: Playing sports. Biking. Skating or skateboarding. Dancing. Running, walking,  jogging, or hiking. Doing yard work. General instructions Take over-the-counter and prescription medicines only as told by your health care provider. Drink enough fluid to keep your urine pale yellow. Keep all follow-up visits. This is important. Where to find more information National Eating Disorders Association (NEDA): www.nationaleatingdisorders.org NEDA Helpline: 1-717-328-9842 Contact a health care provider if: Your symptoms get worse. You start having new symptoms. You start compensating for eating binges with harmful behaviors, such as: Making yourself vomit. Exercising too much. Using laxatives. Get help right away if: You have serious thoughts about hurting yourself or someone else. If you ever feel like you may hurt yourself or others, or have thoughts about taking your own life, get help right away. You can go to your nearest emergency department or: Call your local emergency services (911 in the [place] Call a suicide crisis helpline, such as the National Suicide Prevention Lifeline at 863-106-2763 or 988 in the [place] This is open 24 hours a day in the [place] Text the Crisis Text Line at 727-717-6155 (in the [place] Summary You may have binge-eating disorder if you have feelings of guilt from overeating, eat to the point of feeling uncomfortable, eat a large amount of food in a short time, or find yourself eating when you are not hungry. Seek help from your health care provider. The exact cause of a binge-eating disorder is not known. There are some risk factors for this disease, such as having a mental health disorder and having a history of unhealthy dieting. There are a variety of treatment options such as counseling therapy and medicines. These can help you overcome your binge-eating disorder. This information is not intended to replace advice given to you by your health care provider. Make sure you discuss any questions you have with your health care provider. Document Revised:  11/07/2020 Document Reviewed: 09/12/2020 Elsevier Patient Education  2024 [employer].

## 2023-03-19 LAB — COMPLETE METABOLIC PANEL WITH GFR
AG Ratio: 1.4 (calc) (ref 1.0–2.5)
ALT: 16 U/L (ref 6–29)
AST: 16 U/L (ref 10–35)
Albumin: 4.1 g/dL (ref 3.6–5.1)
Alkaline phosphatase (APISO): 58 U/L (ref 37–153)
BUN: 14 mg/dL (ref 7–25)
CO2: 28 mmol/L (ref 20–32)
Calcium: 10.4 mg/dL (ref 8.6–10.4)
Chloride: 103 mmol/L (ref 98–110)
Creat: 0.82 mg/dL (ref 0.50–1.05)
Globulin: 2.9 g/dL (ref 1.9–3.7)
Glucose, Bld: 81 mg/dL (ref 65–99)
Potassium: 4.4 mmol/L (ref 3.5–5.3)
Sodium: 140 mmol/L (ref 135–146)
Total Bilirubin: 0.3 mg/dL (ref 0.2–1.2)
Total Protein: 7 g/dL (ref 6.1–8.1)
eGFR: 81 mL/min/{1.73_m2} (ref >=60)

## 2023-03-19 LAB — CBC WITH DIFFERENTIAL/PLATELET
Absolute Lymphocytes: 1959 {cells}/uL (ref 850–3900)
Absolute Monocytes: 703 {cells}/uL (ref 200–950)
Basophils Absolute: 79 {cells}/uL (ref 0–200)
Basophils Relative: 1 %
Eosinophils Absolute: 324 {cells}/uL (ref 15–500)
Eosinophils Relative: 4.1 %
HCT: 39 % (ref 35.0–45.0)
Hemoglobin: 12.8 g/dL (ref 11.7–15.5)
MCH: 30.8 pg (ref 27.0–33.0)
MCHC: 32.8 g/dL (ref 32.0–36.0)
MCV: 94 fL (ref 80.0–100.0)
MPV: 9.6 fL (ref 7.5–12.5)
Monocytes Relative: 8.9 %
Neutro Abs: 4835 {cells}/uL (ref 1500–7800)
Neutrophils Relative %: 61.2 %
Platelets: 342 10*3/uL (ref 140–400)
RBC: 4.15 10*6/uL (ref 3.80–5.10)
RDW: 11.8 % (ref 11.0–15.0)
Total Lymphocyte: 24.8 %
WBC: 7.9 10*3/uL (ref 3.8–10.8)

## 2023-03-19 LAB — LIPID PANEL
Cholesterol: 183 mg/dL (ref <200)
HDL: 51 mg/dL (ref >=50)
LDL Cholesterol (Calc): 115 mg/dL — ABNORMAL HIGH
Non-HDL Cholesterol (Calc): 132 mg/dL — ABNORMAL HIGH (ref <130)
Total CHOL/HDL Ratio: 3.6 (calc) (ref <5.0)
Triglycerides: 80 mg/dL (ref <150)

## 2023-03-19 LAB — MAGNESIUM: Magnesium: 1.9 mg/dL (ref 1.5–2.5)

## 2023-03-19 LAB — TSH: TSH: 0.84 m[IU]/L (ref 0.40–4.50)

## 2023-03-24 ENCOUNTER — Other Ambulatory Visit: Payer: Self-pay | Admitting: Dermatology

## 2023-03-31 ENCOUNTER — Other Ambulatory Visit: Payer: Self-pay

## 2023-03-31 MED ORDER — FAMOTIDINE 20 MG PO TABS: 20.0000 mg | ORAL_TABLET | Freq: Two times a day (BID) | ORAL | 0 refills | Status: AC

## 2023-04-02 ENCOUNTER — Encounter: Payer: Self-pay | Admitting: Nurse Practitioner

## 2023-04-02 ENCOUNTER — Ambulatory Visit: Payer: 59 | Admitting: Nurse Practitioner

## 2023-04-02 VITALS — BP 108/62 | HR 114 | Temp 97.5°F | Ht 59.25 in | Wt 166.2 lb

## 2023-04-02 DIAGNOSIS — J029 Acute pharyngitis, unspecified: Secondary | ICD-10-CM

## 2023-04-02 DIAGNOSIS — B3731 Acute candidiasis of vulva and vagina: Secondary | ICD-10-CM

## 2023-04-02 DIAGNOSIS — I10 Essential (primary) hypertension: Secondary | ICD-10-CM | POA: Diagnosis not present

## 2023-04-02 LAB — POCT RAPID STREP A (OFFICE): Rapid Strep A Screen: NEGATIVE

## 2023-04-02 MED ORDER — AMOXICILLIN 500 MG PO TABS: 500.0000 mg | ORAL_TABLET | Freq: Three times a day (TID) | ORAL | 0 refills | Status: DC

## 2023-04-02 MED ORDER — FLUCONAZOLE 150 MG PO TABS
150.0000 mg | ORAL_TABLET | Freq: Once | ORAL | 0 refills | Status: AC
Start: 2023-04-02 — End: 2023-04-02

## 2023-04-02 NOTE — Patient Instructions (Signed)
Use amoxicillin 500 mg 3 times a day for 10 days Warm salt water gargles several times a day Use Mucinex to thin secretions- avoid Sudafed and Dayquil/Nyquil as it can elevate pulse and blood pressure  Pharyngitis  Pharyngitis is a sore throat (pharynx). This is when there is redness, pain, and swelling in your throat. Most of the time, this condition gets better on its own. In some cases, you may need medicine. What are the causes? An infection from a virus. An infection from bacteria. Allergies. What increases the risk? Being 24-25 years old. Being in crowded environments. These include: Daycares. Schools. Dormitories. Living in a place with cold temperatures outside. Having a weakened disease-fighting (immune) system. What are the signs or symptoms? Symptoms may vary depending on the cause. Common symptoms include: Sore throat. Tiredness (fatigue). Low-grade fever. Stuffy nose. Cough. Headache. Other symptoms may include: Glands in the neck (lymph nodes) that are swollen. Skin rashes. Film on the throat or tonsils. This can be caused by an infection from bacteria. Vomiting. Red, itchy eyes. Loss of appetite. Joint pain and muscle aches. Tonsils that are temporarily bigger than usual (enlarged). How is this treated? Many times, treatment is not needed. This condition usually gets better in 3-4 days without treatment. If the infection is caused by a bacteria, you may be need to take antibiotics. Follow these instructions at home: Medicines Take over-the-counter and prescription medicines only as told by your doctor. If you were prescribed an antibiotic medicine, take it as told by your doctor. Do not stop taking the antibiotic even if you start to feel better. Use throat lozenges or sprays to soothe your throat as told by your doctor. Children can get pharyngitis. Do not give your child aspirin. Managing pain To help with pain, try: Sipping warm liquids, such  as: Broth. Herbal tea. Warm water. Eating or drinking cold or frozen liquids, such as frozen ice pops. Rinsing your mouth (gargle) with a salt water mixture 3-4 times a day or as needed. To make salt water, dissolve -1 tsp (3-6 g) of salt in 1 cup (237 mL) of warm water. Do not swallow this mixture. Sucking on hard candy or throat lozenges. Putting a cool-mist humidifier in your bedroom at night to moisten the air. Sitting in the bathroom with the door closed for 5-10 minutes while you run hot water in the shower.  General instructions  Do not smoke or use any products that contain nicotine or tobacco. If you need help quitting, ask your doctor. Rest as told by your doctor. Drink enough fluid to keep your pee (urine) pale yellow. How is this prevented? Wash your hands often for at least 20 seconds with soap and water. If soap and water are not available, use hand sanitizer. Do not touch your eyes, nose, or mouth with unwashed hands. Wash hands after touching these areas. Do not share cups or eating utensils. Avoid close contact with people who are sick. Contact a doctor if: You have large, tender lumps in your neck. You have a rash. You cough up green, yellow-brown, or bloody spit. Get help right away if: You have a stiff neck. You drool or cannot swallow liquids. You cannot drink or take medicines without vomiting. You have very bad pain that does not go away with medicine. You have problems breathing, and it is not from a stuffy nose. You have new pain and swelling in your knees, ankles, wrists, or elbows. These symptoms may be an emergency. Get help  right away. Call your local emergency services (911 in the [place] Do not wait to see if the symptoms will go away. Do not drive yourself to the hospital. Summary Pharyngitis is a sore throat (pharynx). This is when there is redness, pain, and swelling in your throat. Most of the time, pharyngitis gets better on its own.  Sometimes, you may need medicine. If you were prescribed an antibiotic medicine, take it as told by your doctor. Do not stop taking the antibiotic even if you start to feel better. This information is not intended to replace advice given to you by your health care provider. Make sure you discuss any questions you have with your health care provider. Document Revised: 07/11/2020 Document Reviewed: 07/11/2020 Elsevier Patient Education  2024 [employer].

## 2023-04-02 NOTE — Progress Notes (Signed)
Assessment and Plan:  Kelly Goodwin was seen today for acute visit.  Diagnoses and all orders for this visit:  Essential hypertension - continue medications Benicar HCT 40/25 mg every day   - continue DASH diet, exercise and monitor at home. Call if greater than 130/80.   Acute pharyngitis, unspecified etiology/Sore throat [J02.9] Use amoxicillin 500 mg 3 times a day for 10 days Warm salt water gargles several times a day Use Mucinex to thin secretions- avoid Sudafed and Dayquil/Nyquil as it can elevate pulse and blood pressure Rapid strep- negative - Diflucan 150 mg x 1 due to antibiotic use    Further disposition pending results of labs. Discussed med's effects and SE's.   Over 30 minutes of exam, counseling, chart review, and critical decision making was performed.   Future Appointments  Date Time Provider Department Center  06/04/2023  3:00 PM Raynelle Dick, NP [facility] None  06/29/2023  4:15 PM Terri Piedra, DO [facility] None    ------------------------------------------------------------------------------------------------------------------   HPI BP 108/62   Pulse (!) 114   Temp (!) 97.5 F (36.4 C)   Ht 4' 11.25" (1.505 m)   Wt 166 lb 3.2 oz (75.4 kg)   SpO2 99%   BMI 33.29 kg/m   62 y.o.female presents for both ears and sore throat that began yesterday. She works from home but was at [employer] 4 days ago.   She has used Nyquil, chloraseptic, alka seltzer plus and tylenol. Nothing has changed symptoms.   BP well controlled with Benicar HCT 40/25 mg every day  BP Readings from Last 3 Encounters:  04/02/23 108/62  03/18/23 128/62  12/02/22 102/60  Denies headaches, chest pain, shortness of breath and dizziness   Took Dayquil this am- pulse elevated.  Pulse Readings from Last 3 Encounters:  04/02/23 (!) 114  03/18/23 91  12/02/22 93    BMI is Body mass index is 33.29 kg/m., she has been working on diet and exercise. Wt Readings from Last 3 Encounters:   04/02/23 166 lb 3.2 oz (75.4 kg)  03/18/23 165 lb (74.8 kg)  12/02/22 161 lb (73 kg)    Past Medical History:  Diagnosis Date   Arthritis    Constipation, chronic    Diverticulitis    Diverticulosis    Fibroid uterus    History of gallstones    Hyperlipidemia    Hypertension    Hypothyroidism    Migraine    PCOS (polycystic ovarian syndrome)    takes metformin for this   Prediabetes    Thyroid disease      No Known Allergies  Current Outpatient Medications on File Prior to Visit  Medication Sig   azelaic acid (AZELEX) 20 % cream Apply topically 2 (two) times daily. After skin is thoroughly washed and patted dry, gently but thoroughly massage a thin film of azelaic acid cream into the affected area twice daily, in the morning and evening.   clindamycin (CLEOCIN-T) 1 % lotion Apply topically in the morning. To face   clindamycin (CLEOCIN-T) 1 % lotion Apply topically daily. Apply every morning   COLLAGEN PO Take by mouth.   dicyclomine (BENTYL) 10 MG capsule TAKE 1 CAPSULE BY MOUTH THREE TIMES DAILY AS NEEDED FOR ABDOMINAL PAIN OR CRAMPING OR DIARRHEA   estradiol-norethindrone (ACTIVELLA) 1-0.5 MG tablet Take 1 tablet by mouth daily.   famotidine (PEPCID) 20 MG tablet Take 1 tablet (20 mg total) by mouth 2 (two) times daily.   fluconazole (DIFLUCAN) 150 MG tablet Take 150  mg by mouth once.   folic acid (FOLVITE) 1 MG tablet Take 1 mg by mouth daily.   gabapentin (NEURONTIN) 300 MG capsule Take 1 capsule (300 mg total) by mouth 3 (three) times daily.   GARLIC PO Take 1 tablet by mouth 2 (two) times daily.   ibuprofen (ADVIL) 800 MG tablet Take  1/2 to 1 tablet  3 x /day with Food ( every 8 hours) as needed                                                         /                                               TAKE                                    BY                                                                            MOUTH   levothyroxine (SYNTHROID) 100 MCG tablet TAKE 1  TABLET DAILY ON AN EMPTY STOMACH WITH WATER ONLY FOR 30 MIN AND NO ANTACIDS, CALCIUM, MAGNESIUM FOR 4 HRS. AVOID BIOTIN.   lisdexamfetamine (VYVANSE) 30 MG capsule Take 1 capsule (30 mg total) by mouth daily.   metFORMIN (GLUCOPHAGE) 1000 MG tablet TAKE 1 TABLET BY MOUTH TWICE  DAILY FOR DIABETES   metroNIDAZOLE (METROGEL) 0.75 % gel Apply 1 Application topically 2 (two) times daily.   Multiple Vitamins-Minerals (MULTIPLE VITAMINS/WOMENS PO) Take by mouth.   olmesartan-hydrochlorothiazide (BENICAR HCT) 40-25 MG tablet TAKE 1 TABLET BY MOUTH  DAILY FOR BLOOD PRESSURE ,  REPLACES LOSARTAN/HCTZ  TABLET   OVER THE COUNTER MEDICATION Sea moss   OZEMPIC, 2 MG/DOSE, 8 MG/3ML SOPN INJECT 2 MG INTO SKIJN ONCE WEEKLY   pantoprazole (PROTONIX) 40 MG tablet TAKE 1 TABLET(40 MG) BY MOUTH TWICE DAILY   rifaximin (XIFAXAN) 550 MG TABS tablet Take 1 tablet (550 mg total) by mouth 2 (two) times daily.   Rimegepant Sulfate (NURTEC) 75 MG TBDP Take 1 tab under tongue once daily as needed with onset of migraine.   temazepam (RESTORIL) 30 MG capsule TAKE 1 CAPSULE BY MOUTH EVERY NIGHT AT BEDTIME AS NEEDED FOR INSOMNIA   tretinoin (RETIN-A) 0.05 % cream Apply topically at bedtime. Apply a pea sized amount 3 nights weekly   triamcinolone cream (KENALOG) 0.1 % Apply 1 Application topically 2 (two) times daily.   valACYclovir (VALTREX) 500 MG tablet TAKE 1 TABLET BY MOUTH  TWICE DAILY AS NEEDED FOR  COLD SORES / FEVER BLISTERS   No current facility-administered medications on file prior to visit.    ROS: all negative except above.   Physical Exam:  BP 108/62   Pulse (!) 114   Temp (!) 97.5 F (36.4  C)   Ht 4' 11.25" (1.505 m)   Wt 166 lb 3.2 oz (75.4 kg)   SpO2 99%   BMI 33.29 kg/m   General Appearance: Well nourished, in no apparent distress. Eyes: PERRLA, EOMs, conjunctiva no swelling or erythema Sinuses: No Frontal/maxillary tenderness ENT/Mouth: Ext aud canals clear, TMs without erythema,  bulging.Erythema of post pharynx, no exudate. Hearing normal.  Neck: Supple, thyroid normal.  Respiratory: Respiratory effort normal, BS equal bilaterally without rales, rhonchi, wheezing or stridor.  Cardio: RRR with no MRGs. Brisk peripheral pulses without edema.  Abdomen: Soft, + BS.  Non tender, no guarding, rebound, hernias, masses. Lymphatics: Non tender without lymphadenopathy.  Musculoskeletal: Full ROM, 5/5 strength, normal gait.  Skin: Warm, dry without rashes, lesions, ecchymosis.  Neuro: Cranial nerves intact. Normal muscle tone, no cerebellar symptoms. Sensation intact.  Psych: Awake and oriented X 3, normal affect, Insight and Judgment appropriate.     Raynelle Dick, NP 11:41 AM [facility]

## 2023-04-07 ENCOUNTER — Other Ambulatory Visit: Payer: Self-pay | Admitting: Nurse Practitioner

## 2023-04-07 DIAGNOSIS — G4700 Insomnia, unspecified: Secondary | ICD-10-CM

## 2023-04-07 NOTE — Addendum Note (Signed)
Addended by: Dionicio Stall on: 04/07/2023 09:25 AM   Modules accepted: Orders

## 2023-04-07 NOTE — Telephone Encounter (Signed)
In que, for your review

## 2023-04-07 NOTE — Telephone Encounter (Signed)
Refill on restoril. Please send to [facility] #[id number] - [place], [place] - [address] AT [facility] OF [address]

## 2023-04-08 ENCOUNTER — Telehealth: Payer: Self-pay | Admitting: Nurse Practitioner

## 2023-04-08 NOTE — Telephone Encounter (Signed)
Refill on Temazepam please. Send to [facility] #[id number] - [place], [place] - [address] AT [facility] OF [address]

## 2023-04-09 ENCOUNTER — Other Ambulatory Visit: Payer: Self-pay | Admitting: Nurse Practitioner

## 2023-04-09 DIAGNOSIS — G4700 Insomnia, unspecified: Secondary | ICD-10-CM

## 2023-04-09 MED ORDER — TEMAZEPAM 30 MG PO CAPS: ORAL_CAPSULE | ORAL | 0 refills | Status: DC

## 2023-05-12 ENCOUNTER — Telehealth: Payer: Self-pay | Admitting: Nurse Practitioner

## 2023-05-12 ENCOUNTER — Other Ambulatory Visit: Payer: Self-pay | Admitting: Nurse Practitioner

## 2023-05-12 DIAGNOSIS — G4700 Insomnia, unspecified: Secondary | ICD-10-CM

## 2023-05-12 MED ORDER — TEMAZEPAM 30 MG PO CAPS: ORAL_CAPSULE | ORAL | 0 refills | Status: AC

## 2023-05-12 NOTE — Telephone Encounter (Signed)
 Please refill temazepam to [facility] #[id number] - [place], [place] - [address] AT [facility] OF [address]

## 2023-06-04 ENCOUNTER — Encounter: Payer: 59 | Admitting: Nurse Practitioner

## 2023-06-08 ENCOUNTER — Ambulatory Visit: Payer: 59 | Admitting: Nurse Practitioner

## 2023-06-10 ENCOUNTER — Other Ambulatory Visit: Payer: Self-pay

## 2023-06-10 DIAGNOSIS — R7309 Other abnormal glucose: Secondary | ICD-10-CM

## 2023-06-10 MED ORDER — OZEMPIC (2 MG/DOSE) 8 MG/3ML ~~LOC~~ SOPN: 2.0000 mg | PEN_INJECTOR | SUBCUTANEOUS | 3 refills | Status: AC

## 2023-06-16 ENCOUNTER — Emergency Department (HOSPITAL_BASED_OUTPATIENT_CLINIC_OR_DEPARTMENT_OTHER): Payer: 59 | Admitting: Radiology

## 2023-06-16 ENCOUNTER — Ambulatory Visit: Payer: Self-pay | Admitting: Internal Medicine

## 2023-06-16 ENCOUNTER — Encounter (HOSPITAL_BASED_OUTPATIENT_CLINIC_OR_DEPARTMENT_OTHER): Payer: Self-pay | Admitting: Emergency Medicine

## 2023-06-16 ENCOUNTER — Emergency Department (HOSPITAL_BASED_OUTPATIENT_CLINIC_OR_DEPARTMENT_OTHER): Payer: 59

## 2023-06-16 ENCOUNTER — Emergency Department (HOSPITAL_BASED_OUTPATIENT_CLINIC_OR_DEPARTMENT_OTHER)
Admission: EM | Admit: 2023-06-16 | Discharge: 2023-06-16 | Disposition: A | Discharge status: Home / Self Care | Payer: 59 | Attending: Emergency Medicine | Admitting: Emergency Medicine

## 2023-06-16 ENCOUNTER — Other Ambulatory Visit: Payer: Self-pay

## 2023-06-16 ENCOUNTER — Ambulatory Visit
Admission: RE | Admit: 2023-06-16 | Discharge: 2023-06-16 | Disposition: A | Discharge status: Home / Self Care | Payer: 59 | Source: Ambulatory Visit | Attending: Family Medicine | Admitting: Family Medicine

## 2023-06-16 VITALS — BP 122/77 | HR 89 | Temp 98.0°F | Resp 16

## 2023-06-16 DIAGNOSIS — R1013 Epigastric pain: Secondary | ICD-10-CM

## 2023-06-16 DIAGNOSIS — Z79899 Other long term (current) drug therapy: Secondary | ICD-10-CM | POA: Insufficient documentation

## 2023-06-16 DIAGNOSIS — K5792 Diverticulitis of intestine, part unspecified, without perforation or abscess without bleeding: Secondary | ICD-10-CM

## 2023-06-16 DIAGNOSIS — I10 Essential (primary) hypertension: Secondary | ICD-10-CM | POA: Diagnosis not present

## 2023-06-16 DIAGNOSIS — R079 Chest pain, unspecified: Secondary | ICD-10-CM | POA: Diagnosis present

## 2023-06-16 DIAGNOSIS — K5732 Diverticulitis of large intestine without perforation or abscess without bleeding: Secondary | ICD-10-CM | POA: Diagnosis not present

## 2023-06-16 LAB — CBC
HCT: 35.2 % — ABNORMAL LOW (ref 36.0–46.0)
Hemoglobin: 11.4 g/dL — ABNORMAL LOW (ref 12.0–15.0)
MCH: 30.5 pg (ref 26.0–34.0)
MCHC: 32.4 g/dL (ref 30.0–36.0)
MCV: 94.1 fL (ref 80.0–100.0)
Platelets: 372 10*3/uL (ref 150–400)
RBC: 3.74 MIL/uL — ABNORMAL LOW (ref 3.87–5.11)
RDW: 14.6 % (ref 11.5–15.5)
WBC: 8 10*3/uL (ref 4.0–10.5)
nRBC: 0 % (ref 0.0–0.2)

## 2023-06-16 LAB — BASIC METABOLIC PANEL
Anion gap: 7 (ref 5–15)
BUN: 9 mg/dL (ref 8–23)
CO2: 28 mmol/L (ref 22–32)
Calcium: 9.2 mg/dL (ref 8.9–10.3)
Chloride: 102 mmol/L (ref 98–111)
Creatinine, Ser: 0.69 mg/dL (ref 0.44–1.00)
GFR, Estimated: >60 mL/min (ref >60)
Glucose, Bld: 81 mg/dL (ref 70–99)
Potassium: 3.4 mmol/L — ABNORMAL LOW (ref 3.5–5.1)
Sodium: 137 mmol/L (ref 135–145)

## 2023-06-16 LAB — TROPONIN I (HIGH SENSITIVITY)
Troponin I (High Sensitivity): <2 ng/L (ref <18)
Troponin I (High Sensitivity): <2 ng/L (ref <18)

## 2023-06-16 LAB — LIPASE, BLOOD: Lipase: 16 U/L (ref 11–51)

## 2023-06-16 MED ORDER — ALUM & MAG HYDROXIDE-SIMETH 200-200-20 MG/5ML PO SUSP
30.0000 mL | ORAL | Status: AC
  Administered 2023-06-16: 30 mL via ORAL

## 2023-06-16 MED ORDER — IOHEXOL 350 MG/ML SOLN
100.0000 mL | Freq: Once | INTRAVENOUS | Status: DC | PRN
Start: 2023-06-16 — End: 2023-06-17

## 2023-06-16 MED ORDER — FLUCONAZOLE 150 MG PO TABS
150.0000 mg | ORAL_TABLET | Freq: Once | ORAL | 0 refills | Status: AC
Start: 2023-06-16 — End: 2023-06-16

## 2023-06-16 MED ORDER — AMOXICILLIN-POT CLAVULANATE 875-125 MG PO TABS: 1.0000 | ORAL_TABLET | Freq: Two times a day (BID) | ORAL | 0 refills | Status: DC

## 2023-06-16 MED ORDER — PANTOPRAZOLE SODIUM 40 MG IV SOLR
40.0000 mg | Freq: Once | INTRAVENOUS | Status: AC
Start: 2023-06-16 — End: 2023-06-16
  Administered 2023-06-16: 40 mg via INTRAVENOUS
  Filled 2023-06-16: qty 10

## 2023-06-16 MED ORDER — ONDANSETRON HCL 4 MG/2ML IJ SOLN
INTRAMUSCULAR | Status: AC
  Administered 2023-06-16: 4 mg via INTRAVENOUS
  Filled 2023-06-16: qty 2

## 2023-06-16 MED ORDER — SODIUM CHLORIDE 0.9 % IV BOLUS
500.0000 mL | Freq: Once | INTRAVENOUS | Status: AC
  Administered 2023-06-16: 500 mL via INTRAVENOUS

## 2023-06-16 MED ORDER — ONDANSETRON HCL 4 MG/2ML IJ SOLN: 4.0000 mg | Freq: Once | INTRAMUSCULAR | Status: AC

## 2023-06-16 MED ORDER — AMOXICILLIN-POT CLAVULANATE 875-125 MG PO TABS
1.0000 | ORAL_TABLET | Freq: Once | ORAL | Status: AC
  Administered 2023-06-16: 1 via ORAL
  Filled 2023-06-16: qty 1

## 2023-06-16 MED ORDER — IOHEXOL 300 MG/ML  SOLN
100.0000 mL | Freq: Once | INTRAMUSCULAR | Status: AC | PRN
  Administered 2023-06-16: 100 mL via INTRAVENOUS

## 2023-06-16 NOTE — ED Triage Notes (Addendum)
 Pt presents to [facility] with co of vomiting and abd pain in epigastric pain. Pt reports she feeling better today but abd pain and swelling for over a week.   Last bm yesterday . Pmh of diverticulitis and IBS but reports this feels different

## 2023-06-16 NOTE — ED Triage Notes (Signed)
 C/o epigastric pain and bloating. Sent from UC. Given GI cocktail but no relief. Also small "knot in abdomen"

## 2023-06-16 NOTE — Discharge Instructions (Addendum)
 Advised patient to go to [facility] ED for further evaluation of abdominal pain.  Advised patient when taking Protonix take first thing in the morning on empty stomach and no other medication foods or beverages other than water for 45 to 60 minutes in order for medication to coat stomach.  Proton pump inhibitor such as Protonix will not work when combined with other medications when taking.  Advised patient if symptoms worsen please follow-up with your PCP or go to nearest ED.

## 2023-06-16 NOTE — ED Provider Notes (Signed)
 [facility]    CSN: [id number] Arrival date & time: 06/16/23  1546      History   Chief Complaint Chief Complaint  Patient presents with   Abdominal Pain    Epigastric      HPI Kelly Goodwin is a 63 y.o. female.   HPI 63 year old female presents with epigastric abdominal pain with intermittent vomiting for 1 week.  Patient reports history of diverticulitis and IBS; however, reports these symptoms feel different.  PMH significant for chronic constipation, severe binge eating disorder, and aortic atherosclerosis.  Past Medical History:  Diagnosis Date   Arthritis    Constipation, chronic    Diverticulitis    Diverticulosis    Fibroid uterus    History of gallstones    Hyperlipidemia    Hypertension    Hypothyroidism    Migraine    PCOS (polycystic ovarian syndrome)    takes metformin for this   Prediabetes    Thyroid disease     Patient Active Problem List   Diagnosis Date Noted   Hyperlipidemia 12/26/2020   COVID-19 (10/05/2020) 10/08/2020   Aortic atherosclerosis (HCC) 06/11/2018   Fatty liver 05/28/2018   Vitamin D deficiency 09/27/2014   Medication management 09/27/2014   Obesity (BMI 30.0-34.9) 09/27/2014   Endometriosis 09/27/2014   Essential hypertension 07/05/2014   PCOS (polycystic ovarian syndrome)    Migraine    Arthritis    Abnormal glucose (prediabetes)    Irritable bowel syndrome (IBS) 09/01/2013   Diverticulosis 06/05/2011   Hypothyroid 06/05/2011    Past Surgical History:  Procedure Laterality Date   BREAST REDUCTION SURGERY  1988   BREAST SURGERY     CHOLECYSTECTOMY  1995   COLONOSCOPY     LASIK Left    REFRACTIVE SURGERY Left    TONSILLECTOMY AND ADENOIDECTOMY  1990's    OB History   No obstetric history on file.      Home Medications    Prior to Admission medications   Medication Sig Start Date End Date Taking? Authorizing Provider  azelaic acid (AZELEX) 20 % cream Apply topically 2 (two) times  daily. After skin is thoroughly washed and patted dry, gently but thoroughly massage a thin film of azelaic acid cream into the affected area twice daily, in the morning and evening. 04/11/22   Raynelle Dick, NP  clindamycin (CLEOCIN-T) 1 % lotion Apply topically in the morning. To face 06/30/22 06/30/23  Terri Piedra, DO  clindamycin (CLEOCIN-T) 1 % lotion Apply topically daily. Apply every morning 09/30/22 09/30/23  Terri Piedra, DO  COLLAGEN PO Take by mouth.    [provider]  dicyclomine (BENTYL) 10 MG capsule TAKE 1 CAPSULE BY MOUTH THREE TIMES DAILY AS NEEDED FOR ABDOMINAL PAIN OR CRAMPING OR DIARRHEA 03/18/23   Raynelle Dick, NP  estradiol-norethindrone (ACTIVELLA) 1-0.5 MG tablet Take 1 tablet by mouth daily. 03/18/23   Raynelle Dick, NP  famotidine (PEPCID) 20 MG tablet Take 1 tablet (20 mg total) by mouth 2 (two) times daily. 03/31/23   Esterwood, Amy S, PA-C  folic acid (FOLVITE) 1 MG tablet Take 1 mg by mouth daily. 08/20/22   [provider]  gabapentin (NEURONTIN) 300 MG capsule Take 1 capsule (300 mg total) by mouth 3 (three) times daily. 02/24/22   Raynelle Dick, NP  GARLIC PO Take 1 tablet by mouth 2 (two) times daily.    [provider]  ibuprofen (ADVIL) 800 MG tablet Take  1/2 to 1  tablet  3 x /day with Food ( every 8 hours) as needed                                                         /                                               TAKE                                    BY                                                                            MOUTH 12/02/22   Raynelle Dick, NP  levothyroxine (SYNTHROID) 100 MCG tablet TAKE 1 TABLET DAILY ON AN EMPTY STOMACH WITH WATER ONLY FOR 30 MIN AND NO ANTACIDS, CALCIUM, MAGNESIUM FOR 4 HRS. AVOID BIOTIN. 01/05/23   Lucky Cowboy, MD  metFORMIN (GLUCOPHAGE) 1000 MG tablet TAKE 1 TABLET BY MOUTH TWICE  DAILY FOR DIABETES 10/28/22   Raynelle Dick, NP  metroNIDAZOLE (METROGEL) 0.75 % gel  Apply 1 Application topically 2 (two) times daily. 03/25/22   Raynelle Dick, NP  Multiple Vitamins-Minerals (MULTIPLE VITAMINS/WOMENS PO) Take by mouth.    [provider]  olmesartan-hydrochlorothiazide (BENICAR HCT) 40-25 MG tablet TAKE 1 TABLET BY MOUTH  DAILY FOR BLOOD PRESSURE ,  REPLACES LOSARTAN/HCTZ  TABLET 08/14/22   Adela Glimpse, NP  OVER THE COUNTER MEDICATION Sea moss    [provider]  pantoprazole (PROTONIX) 40 MG tablet TAKE 1 TABLET(40 MG) BY MOUTH TWICE DAILY 03/18/23   Raynelle Dick, NP  rifaximin (XIFAXAN) 550 MG TABS tablet Take 1 tablet (550 mg total) by mouth 2 (two) times daily. 02/18/21   Tressia Danas, MD  Rimegepant Sulfate (NURTEC) 75 MG TBDP Take 1 tab under tongue once daily as needed with onset of migraine. 09/20/20   Judd Gaudier, NP  Semaglutide, 2 MG/DOSE, (OZEMPIC, 2 MG/DOSE,) 8 MG/3ML SOPN Inject 2 mg as directed once a week for 4 doses. INJECT 2 MG INTO SKIJN ONCE WEEKLY 06/10/23 07/02/23  Worthy Rancher B, FNP  temazepam (RESTORIL) 30 MG capsule TAKE 1 CAPSULE BY MOUTH EVERY NIGHT AT BEDTIME AS NEEDED FOR INSOMNIA 05/12/23   Adela Glimpse, NP  tretinoin (RETIN-A) 0.05 % cream Apply topically at bedtime. Apply a pea sized amount 3 nights weekly 09/30/22 09/30/23  Terri Piedra, DO  triamcinolone cream (KENALOG) 0.1 % Apply 1 Application topically 2 (two) times daily. 12/23/21   Raynelle Dick, NP  valACYclovir (VALTREX) 500 MG tablet TAKE 1 TABLET BY MOUTH  TWICE DAILY AS NEEDED FOR  COLD SORES / FEVER BLISTERS 01/07/21   Raynelle Dick, NP    Family History Family History  Problem Relation Age of Onset   Diabetes Mother  Lung cancer Mother    COPD Mother    Diverticulitis Mother    Leukemia Mother    Heart disease Mother    Diabetes Sister    Colon cancer Neg Hx    Breast cancer Neg Hx    Stomach cancer Neg Hx    Pancreatic cancer Neg Hx    Liver disease Neg Hx     Social History Social History   Tobacco Use    Smoking status: Never   Smokeless tobacco: Never  Vaping Use   Vaping status: Never Used  Substance Use Topics   Alcohol use: Yes    Comment: rarely   Drug use: No     Allergies   Patient has no known allergies.   Review of Systems Review of Systems  Gastrointestinal:  Positive for abdominal pain.  All other systems reviewed and are negative.    Physical Exam Triage Vital Signs ED Triage Vitals  Encounter Vitals Group     BP 06/16/23 1603 122/77     Systolic BP Percentile --      Diastolic BP Percentile --      Pulse Rate 06/16/23 1603 89     Resp 06/16/23 1603 16     Temp 06/16/23 1600 98 F (36.7 C)     Temp src --      SpO2 06/16/23 1603 98 %     Weight --      Height --      Head Circumference --      Peak Flow --      Pain Score 06/16/23 1558 5     Pain Loc --      Pain Education --      Exclude from Growth Chart --    No data found.  Updated Vital Signs BP 122/77   Pulse 89   Temp 98 F (36.7 C)   Resp 16   SpO2 98%    Physical Exam Vitals and nursing note reviewed.  Constitutional:      Appearance: Normal appearance. She is normal weight.  HENT:     Head: Normocephalic and atraumatic.     Mouth/Throat:     Mouth: Mucous membranes are moist.     Pharynx: Oropharynx is clear.  Eyes:     Extraocular Movements: Extraocular movements intact.     Conjunctiva/sclera: Conjunctivae normal.     Pupils: Pupils are equal, round, and reactive to light.  Cardiovascular:     Rate and Rhythm: Normal rate and regular rhythm.     Pulses: Normal pulses.     Heart sounds: Normal heart sounds.  Pulmonary:     Effort: Pulmonary effort is normal.     Breath sounds: Normal breath sounds. No wheezing, rhonchi or rales.  Musculoskeletal:        General: Normal range of motion.     Cervical back: Normal range of motion and neck supple.  Skin:    General: Skin is warm and dry.  Neurological:     General: No focal deficit present.     Mental Status: She  is alert and oriented to person, place, and time. Mental status is at baseline.  Psychiatric:        Mood and Affect: Mood normal.        Behavior: Behavior normal.      UC Treatments / Results  Labs (all labs ordered are listed, but only abnormal results are displayed) Labs Reviewed - No data to display  EKG  Radiology No results found.  Procedures Procedures (including critical care time)  Medications Ordered in UC Medications  alum & mag hydroxide-simeth (MAALOX/MYLANTA) 200-200-20 MG/5ML suspension 30 mL (30 mLs Oral Given 06/16/23 1711)    Initial Impression / Assessment and Plan / UC Course  I have reviewed the triage vital signs and the nursing notes.  Pertinent labs & imaging results that were available during my care of the patient were reviewed by me and considered in my medical decision making (see chart for details).     MDM: 1.  Abdominal pain, epigastric-patient would not allow abdominal exam this evening and as she was standing during my exam reports she feels better when she stands because her back is straight.  Symptoms were described exactly as GERD "I feel stabbing pain through my sternum that goes right through my back".  Advised patient that Protonix must be taken by itself on empty stomach with full glass of water and no other medications foods or beverages for 45 minutes to an hour.  Patient currently reports taking Pepcid Protonix and all blood pressure medication as well as others in the morning together.  Patient reports GI cocktail given once in clinic provided no relief.  Reports that she was never told to take Protonix by itself. Advised patient to go to [facility] now for further evaluation of abdominal pain.  Patient agreed and verbalized understanding of these instructions and this plan of care today.  Advised patient when taking Protonix take first thing in the morning on empty stomach and no other medication foods or  beverages other than water for 45 to 60 minutes in order for medication to coat stomach.  Proton pump inhibitor such as Protonix will not work when combined with other medications when taking.  Advised patient if symptoms worsen please follow-up with your PCP or go to nearest ED. Final Clinical Impressions(s) / UC Diagnoses   Final diagnoses:  Abdominal pain, epigastric     Discharge Instructions      Advised patient to go to [facility] for further evaluation of abdominal pain.  Advised patient when taking Protonix take first thing in the morning on empty stomach and no other medication foods or beverages other than water for 45 to 60 minutes in order for medication to coat stomach.  Proton pump inhibitor such as Protonix will not work when combined with other medications when taking.  Advised patient if symptoms worsen please follow-up with your PCP or go to nearest ED.     ED Prescriptions   None    PDMP not reviewed this encounter.   Trevor Iha, FNP 06/16/23 1735

## 2023-06-16 NOTE — ED Provider Notes (Signed)
 [facility] EMERGENCY DEPARTMENT AT [facility] Provider Note   CSN: [id number] Arrival date & time: 06/16/23  1757     History  Chief Complaint  Patient presents with   Chest Pain    Kamden Orra Nolde is a 63 y.o. female history of hypertension, IBS here presenting with abdominal distention and lower chest pain.  Patient states that this morning she started having some epigastric pain and abdominal distention.  She took GI cocktail with no relief.  She went to urgent care was sent in for evaluation.  The history is provided by the patient.       Home Medications Prior to Admission medications   Medication Sig Start Date End Date Taking? Authorizing Provider  azelaic acid (AZELEX) 20 % cream Apply topically 2 (two) times daily. After skin is thoroughly washed and patted dry, gently but thoroughly massage a thin film of azelaic acid cream into the affected area twice daily, in the morning and evening. 04/11/22   Raynelle Dick, NP  clindamycin (CLEOCIN-T) 1 % lotion Apply topically in the morning. To face 06/30/22 06/30/23  Terri Piedra, DO  clindamycin (CLEOCIN-T) 1 % lotion Apply topically daily. Apply every morning 09/30/22 09/30/23  Terri Piedra, DO  COLLAGEN PO Take by mouth.    [provider]  dicyclomine (BENTYL) 10 MG capsule TAKE 1 CAPSULE BY MOUTH THREE TIMES DAILY AS NEEDED FOR ABDOMINAL PAIN OR CRAMPING OR DIARRHEA 03/18/23   Raynelle Dick, NP  estradiol-norethindrone (ACTIVELLA) 1-0.5 MG tablet Take 1 tablet by mouth daily. 03/18/23   Raynelle Dick, NP  famotidine (PEPCID) 20 MG tablet Take 1 tablet (20 mg total) by mouth 2 (two) times daily. 03/31/23   Esterwood, Amy S, PA-C  folic acid (FOLVITE) 1 MG tablet Take 1 mg by mouth daily. 08/20/22   [provider]  gabapentin (NEURONTIN) 300 MG capsule Take 1 capsule (300 mg total) by mouth 3 (three) times daily. 02/24/22   Raynelle Dick, NP  GARLIC PO Take 1 tablet by mouth 2  (two) times daily.    [provider]  ibuprofen (ADVIL) 800 MG tablet Take  1/2 to 1 tablet  3 x /day with Food ( every 8 hours) as needed                                                         /                                               TAKE                                    BY  MOUTH 12/02/22   Raynelle Dick, NP  levothyroxine (SYNTHROID) 100 MCG tablet TAKE 1 TABLET DAILY ON AN EMPTY STOMACH WITH WATER ONLY FOR 30 MIN AND NO ANTACIDS, CALCIUM, MAGNESIUM FOR 4 HRS. AVOID BIOTIN. 01/05/23   Lucky Cowboy, MD  metFORMIN (GLUCOPHAGE) 1000 MG tablet TAKE 1 TABLET BY MOUTH TWICE  DAILY FOR DIABETES 10/28/22   Raynelle Dick, NP  metroNIDAZOLE (METROGEL) 0.75 % gel Apply 1 Application topically 2 (two) times daily. 03/25/22   Raynelle Dick, NP  Multiple Vitamins-Minerals (MULTIPLE VITAMINS/WOMENS PO) Take by mouth.    [provider]  olmesartan-hydrochlorothiazide (BENICAR HCT) 40-25 MG tablet TAKE 1 TABLET BY MOUTH  DAILY FOR BLOOD PRESSURE ,  REPLACES LOSARTAN/HCTZ  TABLET 08/14/22   Adela Glimpse, NP  OVER THE COUNTER MEDICATION Sea moss    [provider]  pantoprazole (PROTONIX) 40 MG tablet TAKE 1 TABLET(40 MG) BY MOUTH TWICE DAILY 03/18/23   Raynelle Dick, NP  rifaximin (XIFAXAN) 550 MG TABS tablet Take 1 tablet (550 mg total) by mouth 2 (two) times daily. 02/18/21   Tressia Danas, MD  Rimegepant Sulfate (NURTEC) 75 MG TBDP Take 1 tab under tongue once daily as needed with onset of migraine. 09/20/20   Judd Gaudier, NP  Semaglutide, 2 MG/DOSE, (OZEMPIC, 2 MG/DOSE,) 8 MG/3ML SOPN Inject 2 mg as directed once a week for 4 doses. INJECT 2 MG INTO SKIJN ONCE WEEKLY 06/10/23 07/02/23  Worthy Rancher B, FNP  temazepam (RESTORIL) 30 MG capsule TAKE 1 CAPSULE BY MOUTH EVERY NIGHT AT BEDTIME AS NEEDED FOR INSOMNIA 05/12/23   Adela Glimpse, NP  tretinoin (RETIN-A) 0.05 % cream Apply  topically at bedtime. Apply a pea sized amount 3 nights weekly 09/30/22 09/30/23  Terri Piedra, DO  triamcinolone cream (KENALOG) 0.1 % Apply 1 Application topically 2 (two) times daily. 12/23/21   Raynelle Dick, NP  valACYclovir (VALTREX) 500 MG tablet TAKE 1 TABLET BY MOUTH  TWICE DAILY AS NEEDED FOR  COLD SORES / FEVER BLISTERS 01/07/21   Raynelle Dick, NP      Allergies    Patient has no known allergies.    Review of Systems   Review of Systems  Cardiovascular:  Positive for chest pain.  Gastrointestinal:  Positive for abdominal distention.  All other systems reviewed and are negative.   Physical Exam Updated Vital Signs BP (!) 141/58   Pulse 66   Temp 98 F (36.7 C) (Oral)   Resp 13   SpO2 100%  Physical Exam Vitals and nursing note reviewed.  Constitutional:      Appearance: She is well-developed.  HENT:     Head: Normocephalic.  Eyes:     Extraocular Movements: Extraocular movements intact.     Pupils: Pupils are equal, round, and reactive to light.  Cardiovascular:     Rate and Rhythm: Normal rate and regular rhythm.     Heart sounds: Normal heart sounds.  Pulmonary:     Effort: Pulmonary effort is normal.     Breath sounds: Normal breath sounds.  Abdominal:     Comments: Mild epigastric tenderness  Musculoskeletal:        General: Normal range of motion.     Cervical back: Normal range of motion and neck supple.  Skin:    General: Skin is warm.  Neurological:     General: No focal deficit present.     Mental Status: She is alert and oriented to person, place, and time.  Psychiatric:  Mood and Affect: Mood normal.        Behavior: Behavior normal.     ED Results / Procedures / Treatments   Labs (all labs ordered are listed, but only abnormal results are displayed) Labs Reviewed  BASIC METABOLIC PANEL - Abnormal; Notable for the following components:      Result Value   Potassium 3.4 (*)    All other components within normal limits   CBC - Abnormal; Notable for the following components:   RBC 3.74 (*)    Hemoglobin 11.4 (*)    HCT 35.2 (*)    All other components within normal limits  LIPASE, BLOOD  TROPONIN I (HIGH SENSITIVITY)  TROPONIN I (HIGH SENSITIVITY)    EKG None  Radiology DG Chest 2 View Result Date: 06/16/2023 CLINICAL DATA:  Chest pain. EXAM: CHEST - 2 VIEW COMPARISON:  Chest radiograph dated 12/15/2022. FINDINGS: The heart size and mediastinal contours are within normal limits. No focal consolidation, pleural effusion, or pneumothorax. Cholecystectomy clips in the right upper quadrant. No acute osseous abnormality. IMPRESSION: No acute cardiopulmonary findings. Electronically Signed   By: Hart Robinsons M.D.   On: 06/16/2023 19:31    Procedures Procedures    Medications Ordered in ED Medications  iohexol (OMNIPAQUE) 350 MG/ML injection 100 mL (has no administration in time range)  sodium chloride 0.9 % bolus 500 mL (500 mLs Intravenous New Bag/Given 06/16/23 2158)  pantoprazole (PROTONIX) injection 40 mg (40 mg Intravenous Given 06/16/23 2219)  iohexol (OMNIPAQUE) 300 MG/ML solution 100 mL (100 mLs Intravenous Contrast Given 06/16/23 2207)  ondansetron (ZOFRAN) injection 4 mg (4 mg Intravenous Given 06/16/23 2217)    ED Course/ Medical Decision Making/ A&P                                 Medical Decision Making Ellasyn Kessie Croston is a 63 y.o. female here with abdominal pain and distention.  Consider SBO versus ileus versus gastritis.  Low suspicion for ACS.  Plan to get CBC and CMP and lipase and CT abdomen pelvis and troponin x 2.  11:15 PM Reviewed patient's labs and independently interpreted imaging studies.  White blood cell count is normal.  CT showed possible early diverticulitis with no complications.  At this point patient is stable for discharge on Augmentin  Problems Addressed: Diverticulitis: acute illness or injury  Amount and/or Complexity of Data Reviewed Labs: ordered.  Decision-making details documented in ED Course. Radiology: ordered and independent interpretation performed. Decision-making details documented in ED Course.  Risk Prescription drug management.    Final Clinical Impression(s) / ED Diagnoses Final diagnoses:  None    Rx / DC Orders ED Discharge Orders     None         Charlynne Pander, MD 06/16/23 2316

## 2023-06-16 NOTE — Telephone Encounter (Signed)
  Chief Complaint: abdominal pain Symptoms: vomiting, diarrhea, abdominal pain, bloating Frequency: since yesterday Pertinent Negatives: Patient denies fever, blood or coffee ground emesis or stools, poor appetite Disposition: [] ED /[x] Urgent Care (no appt availability in office) / [] Appointment(In office/virtual)/ []  [facility]/ [] Home Care/ [] Refused Recommended Disposition /[] [facility]/ []  Follow-up with PCP Additional Notes: Patient also mentions she has a knot to the right of her rib cage, round and the size of a nickel and would like that looked at. No available new patient appointments within recommended disposition. Patient agreeable to urgent care visit today. Scheduled patient for 4pm visit today at [facility].  Copied from CRM [id number]. Topic: Clinical - Red Word Triage >> Jun 16, 2023  1:43 PM Shelah Lewandowsky wrote: Red Word that prompted transfer to Nurse Triage: abdominal and back pain, concerned it may be more than her IBS Reason for Disposition  [1] Constant abdominal pain AND [2] present > 2 hours  Answer Assessment - Initial Assessment Questions 1. VOMITING SEVERITY: "How many times have you vomited in the past 24 hours?"     - MILD:  1 - 2 times/day    - MODERATE: 3 - 5 times/day, decreased oral intake without significant weight loss or symptoms of dehydration    - SEVERE: 6 or more times/day, vomits everything or nearly everything, with significant weight loss, symptoms of dehydration      4-5 episodes.  2. ONSET: "When did the vomiting begin?"      Monday started around 11am til 2-3pm. (Yesterday).  3. FLUIDS: "What fluids or food have you vomited up today?" "Have you been able to keep any fluids down?"     Yes, patient states she was able to keep fluids down yesterday and today. She has had saltine crackers, guacamole, and apple sauce. She states she has been drinking hot tea and water.  4. ABDOMEN PAIN: "Are your having  any abdomen pain?" If Yes : "How bad is it and what does it feel like?" (e.g., crampy, dull, intermittent, constant)      Upper and lower abdominal pain. Dull moderate pain today. She states yesterday the pain was a 10/10 and sharp.  5. DIARRHEA: "Is there any diarrhea?" If Yes, ask: "How many times today?"      Yes. She states it was diarrhea and vomiting at the same time. She states it was "explosive diarrhea" and she got a mess all over her hallway and bathroom. She thinks it was about 4-5 episodes.  6. CONTACTS: "Is there anyone else in the family with the same symptoms?"      Denies.  7. CAUSE: "What do you think is causing your vomiting?"     Patient states she has a history of IBS and states she had [place] flavored popcorn Sunday night and states she is not supposed to be eating spicy food.  8. HYDRATION STATUS: "Any signs of dehydration?" (e.g., dry mouth [not only dry lips], too weak to stand) "When did you last urinate?"     Patient states she has been urinating and keeping fluids down today.  9. OTHER SYMPTOMS: "Do you have any other symptoms?" (e.g., fever, headache, vertigo, vomiting blood or coffee grounds, recent head injury)     Upper abdominal bloating (she states she looks pregnant),  Protocols used: Vomiting-A-AH

## 2023-06-16 NOTE — Discharge Instructions (Addendum)
 Take Augmentin twice daily for a week for diverticulitis  You may take Diflucan if you have a inspection  See your doctor for follow-up  Return to ER if you have severe abdominal pain or vomiting or fever

## 2023-06-16 NOTE — ED Notes (Signed)
 Patient is being discharged from the Urgent Care and sent to the Emergency Department via POV. Per Trevor Iha FNP, patient is in need of higher level of care due to abd pain and need for advanced imaging. Patient is aware and verbalizes understanding of plan of care.  Vitals:   06/16/23 1600 06/16/23 1603  BP:  122/77  Pulse:  89  Resp:  16  Temp: 98 F (36.7 C)   SpO2:  98%

## 2023-06-29 ENCOUNTER — Ambulatory Visit: Payer: 59 | Admitting: Dermatology

## 2023-06-29 ENCOUNTER — Encounter: Payer: Self-pay | Admitting: Dermatology

## 2023-06-29 VITALS — BP 107/70

## 2023-06-29 DIAGNOSIS — L700 Acne vulgaris: Secondary | ICD-10-CM | POA: Diagnosis not present

## 2023-06-29 DIAGNOSIS — L578 Other skin changes due to chronic exposure to nonionizing radiation: Secondary | ICD-10-CM

## 2023-06-29 DIAGNOSIS — L579 Skin changes due to chronic exposure to nonionizing radiation, unspecified: Secondary | ICD-10-CM

## 2023-06-29 MED ORDER — SPIRONOLACTONE 50 MG PO TABS: 50.0000 mg | ORAL_TABLET | Freq: Every day | ORAL | 11 refills | Status: AC

## 2023-06-29 MED ORDER — CLINDAMYCIN PHOSPHATE 1 % EX LOTN: TOPICAL_LOTION | Freq: Every morning | CUTANEOUS | 5 refills | Status: AC

## 2023-06-29 MED ORDER — TRETINOIN 0.05 % EX CREA: TOPICAL_CREAM | Freq: Every day | CUTANEOUS | 3 refills | Status: AC

## 2023-06-29 NOTE — Patient Instructions (Addendum)
 Hello Kelly Goodwin,  Thank you for visiting [place] today.   Here is a summary of the key instructions from today's consultation:  Clindamycin Lotion: Continue using in the morning to target your acne.  Tretinoin 0.05%: Apply at night as part of your skincare regimen.  Azelaic Acid: Use daily to enhance your treatment plan.  Spironolactone 100 mg: Take daily. You have been prescribed a 91-month supply with 3 refills available.  Sunscreen: Ensure to use a good sunscreen daily. We recommend Rhina Brackett, which is available on [employer].  Future Considerations: Consider exploring Botox treatments for preventative care in the future.  Please remember to refill your prescriptions as discussed. If you have any questions or need further assistance, do not hesitate to contact our office. We look forward to seeing you again next year or sooner if needed.  Warm regards,  Dr. [facility]      Recommended Sunscreen     Important Information  Due to recent changes in healthcare laws, you may see results of your pathology and/or laboratory studies on MyChart before the doctors have had a chance to review them. We understand that in some cases there may be results that are confusing or concerning to you. Please understand that not all results are received at the same time and often the doctors may need to interpret multiple results in order to provide you with the best plan of care or course of treatment. Therefore, we ask that you please give [place] 2 business days to thoroughly review all your results before contacting the office for clarification. Should we see a critical lab result, you will be contacted sooner.   If You Need Anything After Your Visit  If you have any questions or concerns for your doctor, please call our main line at [phone] If no one answers, please leave a voicemail as directed and we will return your call as soon as possible. Messages left after 4 pm will be answered  the following business day.   You may also send [place] a message via MyChart. We typically respond to MyChart messages within 1-2 business days.  For prescription refills, please ask your pharmacy to contact our office. Our fax number is [phone].  If you have an urgent issue when the clinic is closed that cannot wait until the next business day, you can page your doctor at the number below.    Please note that while we do our best to be available for urgent issues outside of office hours, we are not available 24/7.   If you have an urgent issue and are unable to reach [place], you may choose to seek medical care at your doctor's office, retail clinic, urgent care center, or emergency room.  If you have a medical emergency, please immediately call 911 or go to the emergency department. In the event of inclement weather, please call our main line at [phone] for an update on the status of any delays or closures.  Dermatology Medication Tips: Please keep the boxes that topical medications come in in order to help keep track of the instructions about where and how to use these. Pharmacies typically print the medication instructions only on the boxes and not directly on the medication tubes.   If your medication is too expensive, please contact our office at [phone] or send [place] a message through MyChart.   We are unable to tell what your co-pay for medications will be in advance as this is different depending on your insurance  coverage. However, we may be able to find a substitute medication at lower cost or fill out paperwork to get insurance to cover a needed medication.   If a prior authorization is required to get your medication covered by your insurance company, please allow [place] 1-2 business days to complete this process.  Drug prices often vary depending on where the prescription is filled and some pharmacies may offer cheaper prices.  The website www.goodrx.com contains coupons for  medications through different pharmacies. The prices here do not account for what the cost may be with help from insurance (it may be cheaper with your insurance), but the website can give you the price if you did not use any insurance.  - You can print the associated coupon and take it with your prescription to the pharmacy.  - You may also stop by our office during regular business hours and pick up a GoodRx coupon card.  - If you need your prescription sent electronically to a different pharmacy, notify our office through [facility] or by phone at [phone]

## 2023-06-29 NOTE — Progress Notes (Unsigned)
   Follow-Up Visit   Subjective  Kelly Goodwin is a 63 y.o. female who presents for the following: Acne  Patient present today for follow up visit. Patient was last evaluated on 12/30/22. At this visit patient was prescribed Spirolactone, Clindamycin & alternating tretinoin 0.05% and azelic acid. Patient reports sxs are better. Patient denies medication changes.  The following portions of the chart were reviewed this encounter and updated as appropriate: medications, allergies, medical history  Review of Systems:  No other skin or systemic complaints except as noted in HPI or Assessment and Plan.  Objective  Well appearing patient in no apparent distress; mood and affect are within normal limits.   A focused examination was performed of the following areas: face   Relevant exam findings are noted in the Assessment and Plan.             Assessment & Plan   ACNE VULGARIS Exam: Open comedones and inflammatory papules  stable  Treatment Plan: - Continue using        No follow-ups on file.    Documentation: I have reviewed the above documentation for accuracy and completeness, and I agree with the above.   I, Shirron Marcha Solders, CMA, am acting as scribe for [employer], DO.   Langston Reusing, DO

## 2023-07-27 ENCOUNTER — Ambulatory Visit: Payer: Self-pay | Admitting: Nurse Practitioner

## 2023-07-29 ENCOUNTER — Encounter: Payer: Self-pay | Admitting: Internal Medicine

## 2023-07-29 ENCOUNTER — Other Ambulatory Visit (HOSPITAL_COMMUNITY): Payer: Self-pay

## 2023-07-29 ENCOUNTER — Ambulatory Visit (INDEPENDENT_AMBULATORY_CARE_PROVIDER_SITE_OTHER): Admitting: Internal Medicine

## 2023-07-29 ENCOUNTER — Telehealth: Payer: Self-pay

## 2023-07-29 VITALS — BP 110/70 | HR 67 | Ht 59.0 in | Wt 159.0 lb

## 2023-07-29 DIAGNOSIS — K5792 Diverticulitis of intestine, part unspecified, without perforation or abscess without bleeding: Secondary | ICD-10-CM | POA: Diagnosis not present

## 2023-07-29 DIAGNOSIS — K449 Diaphragmatic hernia without obstruction or gangrene: Secondary | ICD-10-CM | POA: Diagnosis not present

## 2023-07-29 DIAGNOSIS — K5909 Other constipation: Secondary | ICD-10-CM | POA: Diagnosis not present

## 2023-07-29 DIAGNOSIS — K5900 Constipation, unspecified: Secondary | ICD-10-CM

## 2023-07-29 DIAGNOSIS — K760 Fatty (change of) liver, not elsewhere classified: Secondary | ICD-10-CM

## 2023-07-29 DIAGNOSIS — K2100 Gastro-esophageal reflux disease with esophagitis, without bleeding: Secondary | ICD-10-CM

## 2023-07-29 DIAGNOSIS — K219 Gastro-esophageal reflux disease without esophagitis: Secondary | ICD-10-CM

## 2023-07-29 DIAGNOSIS — Z860100 Personal history of colon polyps, unspecified: Secondary | ICD-10-CM

## 2023-07-29 MED ORDER — LUBIPROSTONE 24 MCG PO CAPS: 24.0000 ug | ORAL_CAPSULE | Freq: Two times a day (BID) | ORAL | 3 refills | Status: DC

## 2023-07-29 MED ORDER — PANTOPRAZOLE SODIUM 40 MG PO TBEC: 40.0000 mg | DELAYED_RELEASE_TABLET | Freq: Every day | ORAL | 3 refills | Status: AC

## 2023-07-29 NOTE — Telephone Encounter (Signed)
 Pharmacy Patient Advocate Encounter   Received notification from CoverMyMeds that prior authorization for Lubiprostone capsules is required/requested.   Insurance verification completed.   The patient is insured through [facility] .   Per test claim: PA required; PA submitted to above mentioned insurance via CoverMyMeds Key/confirmation #/EOC [id number] Status is pending

## 2023-07-29 NOTE — Progress Notes (Signed)
 Chief complaint:  Abdominal pain  IMPRESSION:  Epigastric ab discomfort GERD Diverticulitis, recent episode Chronic constipation Small hiatal hernia Hepatic steatosis on imaging History of colon polyps Patient presents with epigastric abdominal discomfort as well as uncontrolled GERD.  She was recently seen in the ED and was diagnosed with uncomplicated diverticulitis for which she was treated with antibiotic therapy.  She was not having significant symptoms due to diverticulitis at that time.  Patient stated that she was taking her Protonix right before bedtime so I counseled her on taking this medication 30 minutes to an hour before eating.  If this change in the timing of her medication is not helpful in the future, then could increase her pantoprazole to twice daily.  I do think that the patient's diet is likely contributing to her issues with uncontrolled reflux since patient does admit to enjoying spicy foods and eating out regularly.  Hopefully she will be able to make small changes over time in order to make her diet more reflux friendly.  Patient also has history of constipation.  She states that during her last colonoscopy, she had to do a multiday bowel prep in order to be able to get adequately clean, suggesting to me that she has significant constipation issues that may also be contributing to her abdominal discomfort.  Thus we will start her on a prescription constipation medication since OTC laxatives have not worked in the past.  Patient was also particularly concerned about her small hiatal hernia that was seen on recent imaging.  I tried to reassure her that this hiatal hernia was small and that we would try our best to treat her GERD with reflux therapies before ever considering surgery.  Patient's mother died apparently after undergoing hiatal hernia repair.  PLAN: - Cut down on spicy foods - Continue current medications including famotidine 20 mg BID - Cont pantoprazole 40  mg QD. Take 30 min to 1 hr before eating. - Start Amitiza 24 mcg BID - Next colonoscopy due in 04/2028 for polyp surveillance - RTC 3 months  HPI: Kelly Goodwin is a 63 y.o. female with history of IBS, prior diverticulitis, PCOS, obesity, endometriosis, uterine fibroids, fatty liver on ultrasound, minimally prominent pancreatic duct on prior imaging, and GERD, prior cholecystectomy presents for follow up after an episode of diverticulitis  Mother had a hiatal hernia and died after a surgery to repair it.   Interval History: She was recently seen in the ED due to issues abdominal pain in the epigastric area.  At that time she had a CT scan that diagnosed her with diverticulitis, nonobstructive nephrolithiasis, and small hiatal hernia. Endorses chest burning and regurgitation. Denies dysphagia. She has nausea and occasional vomiting. She is taking pantoprazole 40 mg every day at night time right before bed. She is taking famotidine 20 mg BID as well. She was given dicyclomine, which helped with her ab cramps, but she has run out of this medication. She has history IBS and cholecystectomy so she has had liquid stools for years. She does not use bathroom regularly. She has tried Investment [employer], [employer], Miralax, and hot prune juice for constipation in the past, which has not worked for her. Linzess caused her hair to fall out.  She uses ibuprofen on occasion. She is not a healthy eater but she has been trying to eat better. She loves spicy foods.  Prior abdominal imaging: - CT abd/pelvis with contrast: diverticulosis without diverticulitis, prominent stool volume - Abdominal ultrasound 04/29/18: echogenic liver,  slight prominence of the pancreatic duct. No mass or stone - CT abd/pelvis with contrast 06/10/18: diverticulosis without diverticulitis - Last CT of the abdomen and pelvis 10/19/2019 showed colonic diverticulosis without diverticulitis, renal calculi, small uterine fibroids, and a healing fracture of the  11th rib -CT A/P w/contrast 06/16/23: IMPRESSION: 1. Possible minimal acute diverticulitis involving the descending colon. No perforation or abscess. 2. Bilateral nonobstructing intrarenal calculi. 3. Uterine fibroids. 4. Small hiatal hernia.  Endoscopic history: -Colonoscopy with Dr. Dickie La in 2012: Mild sigmoid diverticulosis - Colonoscopy 04/01/21: - Preparation of the colon was poor. - Stool in the entire examined colon. - No specimens collected. - EGD 04/01/21: - Normal esophagus. Biopsied. - Gastritis. Biopsied. - Normal examined duodenum. Biopsied. - The examination was otherwise normal. Path: 1. Surgical [P], duodenum - DUODENAL MUCOSA WITH NORMAL VILLOUS ARCHITECTURE. - NO VILLOUS ATROPHY OR INCREASED INTRAEPITHELIAL LYMPHOCYTES. 2. Surgical [P], gastric antrum, body and fundus - ANTRAL AND OXYNTIC MUCOSA WITH MILD CHANGES OF REACTIVE GASTROPATHY. [place] NEGATIVE FOR HELICOBACTER PYLORI. 3. Surgical [P], distal esophagus - GASTROESOPHAGEAL MUCOSA WITH SLIGHT INFLAMMATION CONSISTENT WITH REFLUX. - NO EOSINOPHILIC ESOPHAGITIS OR INTESTINAL METAPLASIA. - Colonoscopy 05/07/21: - Diverticulosis in the sigmoid colon and in the descending colon. - One 4- 5 mm polyp in the sigmoid colon, removed with a cold snare. Resected and retrieved. - One 3 mm polyp in the transverse colon, removed with a cold snare. Resected and retrieved. - The examination was otherwise normal on direct and retroflexion views. Path: 1. Surgical [P], colon, transverse, polyp (1) - TUBULAR ADENOMA, NEGATIVE FOR HIGH-GRADE DYSPLASIA. - NEGATIVE FOR MALIGNANCY. 2. Surgical [P], colon, sigmoid, polyp (1) - TUBULAR ADENOMA, NEGATIVE FOR HIGH-GRADE DYSPLASIA. - NEGATIVE FOR MALIGNANCY.    Past Medical History:  Diagnosis Date   Arthritis    Constipation, chronic    Diverticulitis    Diverticulosis    Fibroid uterus    History of gallstones    Hyperlipidemia    Hypertension    Hypothyroidism     Migraine    PCOS (polycystic ovarian syndrome)    takes metformin for this   Prediabetes    Thyroid disease     Past Surgical History:  Procedure Laterality Date   BREAST REDUCTION SURGERY  1988   BREAST SURGERY     CHOLECYSTECTOMY  1995   COLONOSCOPY     LASIK Left    REFRACTIVE SURGERY Left    TONSILLECTOMY AND ADENOIDECTOMY  1990's    Current Outpatient Medications  Medication Sig Dispense Refill   azelaic acid (AZELEX) 20 % cream Apply topically 2 (two) times daily. After skin is thoroughly washed and patted dry, gently but thoroughly massage a thin film of azelaic acid cream into the affected area twice daily, in the morning and evening. 30 g 0   clindamycin (CLEOCIN-T) 1 % lotion Apply topically daily. Apply every morning 60 mL 3   clindamycin (CLEOCIN-T) 1 % lotion Apply topically in the morning. To face 60 mL 5   COLLAGEN PO Take by mouth.     dicyclomine (BENTYL) 10 MG capsule TAKE 1 CAPSULE BY MOUTH THREE TIMES DAILY AS NEEDED FOR ABDOMINAL PAIN OR CRAMPING OR DIARRHEA 60 capsule 1   estradiol-norethindrone (ACTIVELLA) 1-0.5 MG tablet Take 1 tablet by mouth daily. 84 tablet 3   famotidine (PEPCID) 20 MG tablet Take 1 tablet (20 mg total) by mouth 2 (two) times daily. 180 tablet 0   folic acid (FOLVITE) 1 MG tablet Take 1 mg by mouth daily.  GARLIC PO Take 1 tablet by mouth 2 (two) times daily.     ibuprofen (ADVIL) 800 MG tablet Take  1/2 to 1 tablet  3 x /day with Food ( every 8 hours) as needed                                                         /                                               TAKE                                    BY                                                                            MOUTH 90 tablet 2   levothyroxine (SYNTHROID) 100 MCG tablet TAKE 1 TABLET DAILY ON AN EMPTY STOMACH WITH WATER ONLY FOR 30 MIN AND NO ANTACIDS, CALCIUM, MAGNESIUM FOR 4 HRS. AVOID BIOTIN. 90 tablet 3   metFORMIN (GLUCOPHAGE) 1000 MG tablet TAKE 1 TABLET BY MOUTH  TWICE  DAILY FOR DIABETES 180 tablet 3   metroNIDAZOLE (METROGEL) 0.75 % gel Apply 1 Application topically 2 (two) times daily. 45 g 0   Multiple Vitamins-Minerals (MULTIPLE VITAMINS/WOMENS PO) Take by mouth.     olmesartan-hydrochlorothiazide (BENICAR HCT) 40-25 MG tablet TAKE 1 TABLET BY MOUTH  DAILY FOR BLOOD PRESSURE ,  REPLACES LOSARTAN/HCTZ  TABLET 90 tablet 3   OVER THE COUNTER MEDICATION Sea moss     pantoprazole (PROTONIX) 40 MG tablet TAKE 1 TABLET(40 MG) BY MOUTH TWICE DAILY 180 tablet 3   rifaximin (XIFAXAN) 550 MG TABS tablet Take 1 tablet (550 mg total) by mouth 2 (two) times daily. 28 tablet 0   Rimegepant Sulfate (NURTEC) 75 MG TBDP Take 1 tab under tongue once daily as needed with onset of migraine. 2 tablet 0   spironolactone (ALDACTONE) 50 MG tablet Take 1 tablet (50 mg total) by mouth daily. Take one tablet daily with a full glass of water 30 tablet 11   temazepam (RESTORIL) 30 MG capsule TAKE 1 CAPSULE BY MOUTH EVERY NIGHT AT BEDTIME AS NEEDED FOR INSOMNIA 30 capsule 0   tretinoin (RETIN-A) 0.05 % cream Apply topically at bedtime. Apply a pea sized amount 3 nights weekly 45 g 3   triamcinolone cream (KENALOG) 0.1 % Apply 1 Application topically 2 (two) times daily. 30 g 0   valACYclovir (VALTREX) 500 MG tablet TAKE 1 TABLET BY MOUTH  TWICE DAILY AS NEEDED FOR  COLD SORES / FEVER BLISTERS 180 tablet 3   amoxicillin-clavulanate (AUGMENTIN) 875-125 MG tablet Take 1 tablet by mouth every 12 (twelve) hours. (Patient not taking: Reported on 07/29/2023) 14 tablet 0   gabapentin (NEURONTIN) 300 MG capsule Take 1 capsule (300 mg  total) by mouth 3 (three) times daily. (Patient not taking: Reported on 07/29/2023) 90 capsule 2   No current facility-administered medications for this visit.    Allergies as of 07/29/2023   (No Known Allergies)    Family History  Problem Relation Age of Onset   Diabetes Mother    Lung cancer Mother    COPD Mother    Diverticulitis Mother    Leukemia  Mother    Heart disease Mother    Diabetes Sister    Colon cancer Neg Hx    Breast cancer Neg Hx    Stomach cancer Neg Hx    Pancreatic cancer Neg Hx    Liver disease Neg Hx       Physical Exam: General:   Alert,  well-nourished, pleasant and cooperative in NAD Head:  Normocephalic and atraumatic. Eyes:  Sclera clear, no icterus.   Conjunctiva pink. Ears:  Normal auditory acuity. Nose:  No deformity, discharge,  or lesions. Mouth:  No deformity or lesions.   Neck:  Supple; no masses or thyromegaly. Lungs:  Clear throughout to auscultation.   No wheezes. Heart:  Regular rate and rhythm; no murmurs. Abdomen:  Soft, non-tender Msk:  Symmetrical. No boney deformities LAD: No inguinal or umbilical LAD Extremities:  No clubbing or edema. Neurologic:  Alert and  oriented x4;  grossly nonfocal Skin:  Intact without significant lesions or rashes. Psych:  Alert and cooperative. Normal mood and affect.   Eulah Pont, MD 07/29/2023, 11:00 AM  I spent 40 minutes of time, including in depth chart review, independent review of results as outlined above, communicating results with the patient directly, face-to-face time with the patient, coordinating care, and ordering studies and medications as appropriate, and documentation.

## 2023-07-29 NOTE — Telephone Encounter (Signed)
 Pharmacy Patient Advocate Encounter  Received notification from [facility] that Prior Authorization for Lubiprostone capsules has been APPROVED from 07-29-2023 to 07-28-2024   PA #/Case ID/Reference #: [id number]

## 2023-07-29 NOTE — Patient Instructions (Addendum)
 Try to cut down on spicy food   We have sent the following medications to your pharmacy for you to pick up at your convenience: Amitiza, Pantoprazole  You are scheduled for a follow up visit on 10/28/23 at 3:40 pm with Dr Leonides Schanz  _______________________________________________________  If your blood pressure at your visit was 140/90 or greater, please contact your primary care physician to follow up on this.  _______________________________________________________  If you are age 45 or older, your body mass index should be between 23-30. Your Body mass index is 32.11 kg/m. If this is out of the aforementioned range listed, please consider follow up with your Primary Care Provider.  If you are age 53 or younger, your body mass index should be between 19-25. Your Body mass index is 32.11 kg/m. If this is out of the aformentioned range listed, please consider follow up with your Primary Care Provider.   ________________________________________________________  The [facility] providers would like to encourage you to use [facility] to communicate with providers for non-urgent requests or questions.  Due to long hold times on the telephone, sending your provider a message by [facility] may be a faster and more efficient way to get a response.  Please allow 48 business hours for a response.  Please remember that this is for non-urgent requests.  _______________________________________________________   Thank you for entrusting me with your care and for choosing [facility],  Dr. Eulah Pont

## 2023-07-31 ENCOUNTER — Telehealth: Payer: Self-pay

## 2023-07-31 NOTE — Telephone Encounter (Signed)
 Spoke to patient advise the prior authorization for (Amitiza)Lubiprostone capsules has been approved from 07-29-2023 to 07-28-2024. Also sent information over to patient via MyChart patient verbalized understanding.

## 2023-09-15 ENCOUNTER — Other Ambulatory Visit: Payer: Self-pay

## 2023-09-15 MED ORDER — SPIRONOLACTONE 50 MG PO TABS: 50.0000 mg | ORAL_TABLET | Freq: Every day | ORAL | 3 refills | Status: AC

## 2023-09-15 NOTE — Progress Notes (Signed)
 Patient called. She would like her prescription for Spironolactone  50 mg sent to [facility] because it is cheaper than getting it monthly from [facility].

## 2023-10-28 ENCOUNTER — Ambulatory Visit: Admitting: Internal Medicine

## 2023-10-28 ENCOUNTER — Encounter: Payer: Self-pay | Admitting: Internal Medicine

## 2023-10-28 VITALS — BP 118/62 | HR 110 | Ht 60.0 in | Wt 165.0 lb

## 2023-10-28 DIAGNOSIS — K760 Fatty (change of) liver, not elsewhere classified: Secondary | ICD-10-CM

## 2023-10-28 DIAGNOSIS — K5909 Other constipation: Secondary | ICD-10-CM

## 2023-10-28 DIAGNOSIS — K5900 Constipation, unspecified: Secondary | ICD-10-CM

## 2023-10-28 DIAGNOSIS — K449 Diaphragmatic hernia without obstruction or gangrene: Secondary | ICD-10-CM

## 2023-10-28 DIAGNOSIS — K219 Gastro-esophageal reflux disease without esophagitis: Secondary | ICD-10-CM | POA: Diagnosis not present

## 2023-10-28 DIAGNOSIS — Z8719 Personal history of other diseases of the digestive system: Secondary | ICD-10-CM

## 2023-10-28 DIAGNOSIS — Z860100 Personal history of colon polyps, unspecified: Secondary | ICD-10-CM

## 2023-10-28 MED ORDER — LUBIPROSTONE 24 MCG PO CAPS: 24.0000 ug | ORAL_CAPSULE | Freq: Two times a day (BID) | ORAL | 5 refills | Status: DC

## 2023-10-28 NOTE — Patient Instructions (Addendum)
 We have sent the following medications to your pharmacy for you to pick up at your convenience: Lubiprostone   Follow up in 6 months   If your blood pressure at your visit was 140/90 or greater, please contact your primary care physician to follow up on this.  _______________________________________________________  If you are age 62 or older, your body mass index should be between 23-30. Your Body mass index is 32.22 kg/m. If this is out of the aforementioned range listed, please consider follow up with your Primary Care Provider.  If you are age 27 or younger, your body mass index should be between 19-25. Your Body mass index is 32.22 kg/m. If this is out of the aformentioned range listed, please consider follow up with your Primary Care Provider.   ________________________________________________________  The [facility] providers would like to encourage you to use MYCHART to communicate with providers for non-urgent requests or questions.  Due to long hold times on the telephone, sending your provider a message by [facility] may be a faster and more efficient way to get a response.  Please allow 48 business hours for a response.  Please remember that this is for non-urgent requests.  _______________________________________________________  Thank you for entrusting me with your care and for choosing [facility],  Dr. Estefana Kidney

## 2023-10-28 NOTE — Progress Notes (Signed)
 Chief complaint:  Abdominal pain  IMPRESSION:  Epigastric ab discomfort - resolved GERD Chronic constipation - improved Small hiatal hernia Hepatic steatosis on imaging History of colon polyps History of diverticulitis Patient is doing well today. Her constipation has improved significantly with Amitiza  therapy. She is now having much more frequent BMs. Will plan to continue this. Her GERD also seems to be well controlled on PPI and H2 blocker therapy. Weight has gone up slightly since her GI symptoms have improved, but patient is aware of this and will try to adjust her diet.  PLAN: - Continue current medications including famotidine  20 mg BID - Cont pantoprazole  40 mg QD. Take 30 min to 1 hr before eating. - Cont Amitiza  24 mcg BID. Refill - Next colonoscopy due in 04/2028 for polyp surveillance - RTC 6 months  HPI: Kelly Goodwin is a 63 y.o. female with history of IBS, prior diverticulitis, PCOS, obesity, endometriosis, uterine fibroids, fatty liver on ultrasound, minimally prominent pancreatic duct on prior imaging, GERD, and prior cholecystectomy presents for follow up of constipation  Mother had a hiatal hernia and died after a surgery to repair it. She has tried Benefiber, Miralax, and hot prune juice for constipation in the past, which have not worked for her. Linzess  caused her hair to fall out.   Interval History: Her constipation has improved. It is not perfect but it is much improved. She likes the Amitiza  better than Linzess . Endorses 1 BM every other day. Denies N&V. She has gained a little of weight because she has been feeling better. Denies acid reflux. She is still taking pantoprazole  40 mg daily, which has been very effective. Denies ab pain.  Wt Readings from Last 3 Encounters:  10/28/23 165 lb (74.8 kg)  07/29/23 159 lb (72.1 kg)  04/02/23 166 lb 3.2 oz (75.4 kg)   Labs 02/2023: CBC and CMP nml.   Labs 05/2023: CBC with low Hb of 11.8. BMP with  mildly low K of 3.4. Lipase nml.   Prior abdominal imaging: - CT abd/pelvis with contrast: diverticulosis without diverticulitis, prominent stool volume - Abdominal ultrasound 04/29/18: echogenic liver, slight prominence of the pancreatic duct. No mass or stone - CT abd/pelvis with contrast 06/10/18: diverticulosis without diverticulitis - Last CT of the abdomen and pelvis 10/19/2019 showed colonic diverticulosis without diverticulitis, renal calculi, small uterine fibroids, and a healing fracture of the 11th rib -CT A/P w/contrast 06/16/23: IMPRESSION: 1. Possible minimal acute diverticulitis involving the descending colon. No perforation or abscess. 2. Bilateral nonobstructing intrarenal calculi. 3. Uterine fibroids. 4. Small hiatal hernia.  Endoscopic history: -Colonoscopy with Dr. Karyle in 2012: Mild sigmoid diverticulosis - Colonoscopy 04/01/21: - Preparation of the colon was poor. - Stool in the entire examined colon. - No specimens collected. - EGD 04/01/21: - Normal esophagus. Biopsied. - Gastritis. Biopsied. - Normal examined duodenum. Biopsied. - The examination was otherwise normal. Path: 1. Surgical [P], duodenum - DUODENAL MUCOSA WITH NORMAL VILLOUS ARCHITECTURE. - NO VILLOUS ATROPHY OR INCREASED INTRAEPITHELIAL LYMPHOCYTES. 2. Surgical [P], gastric antrum, body and fundus - ANTRAL AND OXYNTIC MUCOSA WITH MILD CHANGES OF REACTIVE GASTROPATHY. [place] PHLEGM NEGATIVE FOR HELICOBACTER PYLORI. 3. Surgical [P], distal esophagus - GASTROESOPHAGEAL MUCOSA WITH SLIGHT INFLAMMATION CONSISTENT WITH REFLUX. - NO EOSINOPHILIC ESOPHAGITIS OR INTESTINAL METAPLASIA.  - Colonoscopy 05/07/21: - Diverticulosis in the sigmoid colon and in the descending colon. - One 4- 5 mm polyp in the sigmoid colon, removed with a cold snare. Resected and retrieved. - One 3 mm  polyp in the transverse colon, removed with a cold snare. Resected and retrieved. - The examination was otherwise normal on direct and  retroflexion views. Path: 1. Surgical [P], colon, transverse, polyp (1) - TUBULAR ADENOMA, NEGATIVE FOR HIGH-GRADE DYSPLASIA. - NEGATIVE FOR MALIGNANCY. 2. Surgical [P], colon, sigmoid, polyp (1) - TUBULAR ADENOMA, NEGATIVE FOR HIGH-GRADE DYSPLASIA. - NEGATIVE FOR MALIGNANCY.    Past Medical History:  Diagnosis Date   Arthritis    Constipation, chronic    Diverticulitis    Diverticulosis    Fibroid uterus    History of gallstones    Hyperlipidemia    Hypertension    Hypothyroidism    Migraine    PCOS (polycystic ovarian syndrome)    takes metformin  for this   Prediabetes    Thyroid  disease     Past Surgical History:  Procedure Laterality Date   BREAST REDUCTION SURGERY  1988   BREAST SURGERY     CHOLECYSTECTOMY  1995   COLONOSCOPY     LASIK Left    REFRACTIVE SURGERY Left    TONSILLECTOMY AND ADENOIDECTOMY  1990's    Current Outpatient Medications  Medication Sig Dispense Refill   azelaic acid  (AZELEX ) 20 % cream Apply topically 2 (two) times daily. After skin is thoroughly washed and patted dry, gently but thoroughly massage a thin film of azelaic acid  cream into the affected area twice daily, in the morning and evening. 30 g 0   clindamycin  (CLEOCIN -T) 1 % lotion Apply topically in the morning. To face 60 mL 5   COLLAGEN PO Take by mouth.     dicyclomine  (BENTYL ) 10 MG capsule TAKE 1 CAPSULE BY MOUTH THREE TIMES DAILY AS NEEDED FOR ABDOMINAL PAIN OR CRAMPING OR DIARRHEA 60 capsule 1   estradiol -norethindrone  (ACTIVELLA) 1-0.5 MG tablet Take 1 tablet by mouth daily. 84 tablet 3   famotidine  (PEPCID ) 20 MG tablet Take 1 tablet (20 mg total) by mouth 2 (two) times daily. 180 tablet 0   folic acid (FOLVITE) 1 MG tablet Take 1 mg by mouth daily.     gabapentin  (NEURONTIN ) 300 MG capsule Take 1 capsule (300 mg total) by mouth 3 (three) times daily. 90 capsule 2   GARLIC PO Take 1 tablet by mouth 2 (two) times daily.     ibuprofen  (ADVIL ) 800 MG tablet Take  1/2 to 1  tablet  3 x /day with Food ( every 8 hours) as needed                                                         /                                               TAKE                                    BY  MOUTH 90 tablet 2   levothyroxine  (SYNTHROID ) 100 MCG tablet TAKE 1 TABLET DAILY ON AN EMPTY STOMACH WITH WATER ONLY FOR 30 MIN AND NO ANTACIDS, CALCIUM, MAGNESIUM FOR 4 HRS. AVOID BIOTIN. 90 tablet 3   lubiprostone  (AMITIZA ) 24 MCG capsule Take 1 capsule (24 mcg total) by mouth 2 (two) times daily with a meal. 60 capsule 3   metFORMIN  (GLUCOPHAGE ) 1000 MG tablet TAKE 1 TABLET BY MOUTH TWICE  DAILY FOR DIABETES 180 tablet 3   metroNIDAZOLE  (METROGEL ) 0.75 % gel Apply 1 Application topically 2 (two) times daily. 45 g 0   Multiple Vitamins-Minerals (MULTIPLE VITAMINS/WOMENS PO) Take by mouth.     olmesartan -hydrochlorothiazide (BENICAR  HCT) 40-25 MG tablet TAKE 1 TABLET BY MOUTH  DAILY FOR BLOOD PRESSURE ,  REPLACES LOSARTAN /HCTZ  TABLET 90 tablet 3   OVER THE COUNTER MEDICATION Sea moss     pantoprazole  (PROTONIX ) 40 MG tablet Take 1 tablet (40 mg total) by mouth daily. TAKE 1 TABLET(40 MG) DAILY BY MOUTH 30-60 MINUTES BEFORE DINNER 180 tablet 3   rifaximin  (XIFAXAN ) 550 MG TABS tablet Take 1 tablet (550 mg total) by mouth 2 (two) times daily. 28 tablet 0   Rimegepant Sulfate (NURTEC) 75 MG TBDP Take 1 tab under tongue once daily as needed with onset of migraine. 2 tablet 0   spironolactone  (ALDACTONE ) 50 MG tablet Take 1 tablet (50 mg total) by mouth daily. Take one tablet daily with a full glass of water 30 tablet 11   spironolactone  (ALDACTONE ) 50 MG tablet Take 1 tablet (50 mg total) by mouth daily. 90 tablet 3   temazepam  (RESTORIL ) 30 MG capsule TAKE 1 CAPSULE BY MOUTH EVERY NIGHT AT BEDTIME AS NEEDED FOR INSOMNIA 30 capsule 0   tretinoin  (RETIN-A ) 0.05 % cream Apply topically at bedtime. Apply a pea sized amount 3 nights  weekly 45 g 3   triamcinolone  cream (KENALOG ) 0.1 % Apply 1 Application topically 2 (two) times daily. 30 g 0   valACYclovir  (VALTREX ) 500 MG tablet TAKE 1 TABLET BY MOUTH  TWICE DAILY AS NEEDED FOR  COLD SORES / FEVER BLISTERS 180 tablet 3   No current facility-administered medications for this visit.    Allergies as of 10/28/2023   (No Known Allergies)    Family History  Problem Relation Age of Onset   Diabetes Mother    Lung cancer Mother    COPD Mother    Diverticulitis Mother    Leukemia Mother    Heart disease Mother    Diabetes Sister    Colon cancer Neg Hx    Breast cancer Neg Hx    Stomach cancer Neg Hx    Pancreatic cancer Neg Hx    Liver disease Neg Hx    Physical Exam: General:   Alert,  well-nourished, pleasant and cooperative in NAD Head:  Normocephalic and atraumatic. Eyes:  Sclera clear, no icterus.   Conjunctiva pink. Ears:  Normal auditory acuity. Nose:  No deformity, discharge,  or lesions. Mouth:  No deformity or lesions.   Neck:  Supple; no masses or thyromegaly. Lungs:  Clear throughout to auscultation.   No wheezes. Heart:  Regular rate and rhythm; no murmurs. Abdomen:  Soft, non-tender Msk:  Symmetrical. No boney deformities LAD: No inguinal or umbilical LAD Extremities:  No clubbing or edema. Neurologic:  Alert and  oriented x4;  grossly nonfocal Skin:  Intact without significant lesions or rashes. Psych:  Alert and cooperative. Normal mood and affect.   Estefana Kidney, MD  10/28/2023, 3:58 PM  I spent 36 minutes of time, including in depth chart review, independent review of results as outlined above, communicating results with the patient directly, face-to-face time with the patient, coordinating care, and ordering studies and medications as appropriate, and documentation.

## 2023-11-23 ENCOUNTER — Other Ambulatory Visit: Payer: Self-pay | Admitting: Obstetrics and Gynecology

## 2023-11-23 DIAGNOSIS — Z1231 Encounter for screening mammogram for malignant neoplasm of breast: Secondary | ICD-10-CM

## 2023-12-14 ENCOUNTER — Ambulatory Visit
Admission: RE | Admit: 2023-12-14 | Discharge: 2023-12-14 | Disposition: A | Discharge status: Home / Self Care | Source: Ambulatory Visit | Attending: Obstetrics and Gynecology | Admitting: Obstetrics and Gynecology

## 2023-12-14 DIAGNOSIS — Z1231 Encounter for screening mammogram for malignant neoplasm of breast: Secondary | ICD-10-CM

## 2023-12-19 ENCOUNTER — Other Ambulatory Visit: Payer: Self-pay | Admitting: Nurse Practitioner

## 2024-01-25 ENCOUNTER — Other Ambulatory Visit: Payer: Self-pay | Admitting: Nurse Practitioner

## 2024-01-25 DIAGNOSIS — R7303 Prediabetes: Secondary | ICD-10-CM

## 2024-03-09 ENCOUNTER — Other Ambulatory Visit: Payer: Self-pay

## 2024-03-09 ENCOUNTER — Emergency Department (HOSPITAL_BASED_OUTPATIENT_CLINIC_OR_DEPARTMENT_OTHER)

## 2024-03-09 ENCOUNTER — Ambulatory Visit: Payer: Self-pay

## 2024-03-09 ENCOUNTER — Emergency Department (HOSPITAL_BASED_OUTPATIENT_CLINIC_OR_DEPARTMENT_OTHER)
Admission: EM | Admit: 2024-03-09 | Discharge: 2024-03-09 | Disposition: A | Discharge status: Home / Self Care | Attending: Emergency Medicine | Admitting: Emergency Medicine

## 2024-03-09 DIAGNOSIS — Z79899 Other long term (current) drug therapy: Secondary | ICD-10-CM | POA: Diagnosis not present

## 2024-03-09 DIAGNOSIS — R1013 Epigastric pain: Secondary | ICD-10-CM | POA: Insufficient documentation

## 2024-03-09 LAB — CBC
HCT: 37.8 % (ref 36.0–46.0)
Hemoglobin: 12.5 g/dL (ref 12.0–15.0)
MCH: 30.3 pg (ref 26.0–34.0)
MCHC: 33.1 g/dL (ref 30.0–36.0)
MCV: 91.5 fL (ref 80.0–100.0)
Platelets: 358 K/uL (ref 150–400)
RBC: 4.13 MIL/uL (ref 3.87–5.11)
RDW: 14.5 % (ref 11.5–15.5)
WBC: 9 K/uL (ref 4.0–10.5)
nRBC: 0 % (ref 0.0–0.2)

## 2024-03-09 LAB — COMPREHENSIVE METABOLIC PANEL WITH GFR
ALT: 11 U/L (ref 0–44)
AST: 14 U/L — ABNORMAL LOW (ref 15–41)
Albumin: 4.1 g/dL (ref 3.5–5.0)
Alkaline Phosphatase: 65 U/L (ref 38–126)
Anion gap: 9 (ref 5–15)
BUN: 15 mg/dL (ref 8–23)
CO2: 23 mmol/L (ref 22–32)
Calcium: 9.5 mg/dL (ref 8.9–10.3)
Chloride: 102 mmol/L (ref 98–111)
Creatinine, Ser: 0.75 mg/dL (ref 0.44–1.00)
GFR, Estimated: >60 mL/min (ref >60)
Glucose, Bld: 100 mg/dL — ABNORMAL HIGH (ref 70–99)
Potassium: 4.1 mmol/L (ref 3.5–5.1)
Sodium: 134 mmol/L — ABNORMAL LOW (ref 135–145)
Total Bilirubin: 0.3 mg/dL (ref 0.0–1.2)
Total Protein: 7.1 g/dL (ref 6.5–8.1)

## 2024-03-09 LAB — URINALYSIS, ROUTINE W REFLEX MICROSCOPIC
Bilirubin Urine: NEGATIVE
Glucose, UA: NEGATIVE mg/dL
Ketones, ur: NEGATIVE mg/dL
Leukocytes,Ua: NEGATIVE
Nitrite: NEGATIVE
Protein, ur: NEGATIVE mg/dL
Specific Gravity, Urine: 1.022 (ref 1.005–1.030)
pH: 5.5 (ref 5.0–8.0)

## 2024-03-09 LAB — LIPASE, BLOOD: Lipase: 20 U/L (ref 11–51)

## 2024-03-09 MED ORDER — IOHEXOL 300 MG/ML  SOLN
100.0000 mL | Freq: Once | INTRAMUSCULAR | Status: AC | PRN
  Administered 2024-03-09: 100 mL via INTRAVENOUS

## 2024-03-09 MED ORDER — HYDROCODONE-ACETAMINOPHEN 5-325 MG PO TABS: 1.0000 | ORAL_TABLET | Freq: Four times a day (QID) | ORAL | 0 refills | Status: AC | PRN

## 2024-03-09 NOTE — Telephone Encounter (Signed)
 FYI Only or Action Required?: FYI only for provider: ED advised.  Patient was last seen in primary care on 04/02/2023 by Jude Lonell BRAVO, NP.  Called Nurse Triage reporting Bloated.  Symptoms began several days ago.  Interventions attempted: OTC medications: Pepto bismol.  Symptoms are: unchanged.  Triage Disposition: Go to ED Now (Notify PCP)  Patient/caregiver understands and will follow disposition?: No, wishes to speak with PCP   Copied from CRM #[id number]. Topic: Clinical - Red Word Triage >> Mar 09, 2024 12:30 PM Charlet HERO wrote: Red Word that prompted transfer to Nurse Triage: Patient is calling about when she eats or drinks stomach becomes bloated and is in severe pain. Georgina Speaks [facility] Reason for Disposition  [1] MODERATE-SEVERE SWELLING of abdomen (e.g., looks very distended or swollen) AND [2] NEW-onset or much worse AND [3] vomiting  Answer Assessment - Initial Assessment Questions Advised ED now.  Scheduled new appt 06/16/24.  1. SYMPTOM: What's the main symptom you're concerned about? (e.g., abdomen bloating, swelling)     Bloated intermittent, nauseous,  Last bm; diarrhea, last 24 hours; 4-5x, watery loose 2. ONSET: When did   start?     Saturday, after eating and drinking 3. SEVERITY: How bad is the bloating or swelling?     Hard intermittent 4. ABDOMEN PAIN:  Is there any abdomen pain? If Yes, ask: How bad is the pain?  (e.g., Scale 0-10; or none, mild, moderate, or severe)     7/10; pepto bismal 5. RELIEVING AND AGGRAVATING FACTORS: What makes it better or worse? (e.g., certain foods, lactose, medicines)     Eating, drinking 6. GI HISTORY: Do you have any history of stomach or intestine problems? (e.g., bowel obstruction, cancer, irritable bowel)      diverticulitis, IBS 7. CAUSE: What do you think is causing the bloating?      Stomach infection,   8. OTHER SYMPTOMS: Do you have any other symptoms? (e.g., belching, blood in stool,  breathing difficulty, constipation, diarrhea, fever, passing gas, vomiting, weight loss, white of eyes have turned yellow)     Denies fever, chills, vomiting, blood/mucous stool, diif breathin, chest pain  Protocols used: Abdomen Bloating and Swelling-A-AH

## 2024-03-09 NOTE — ED Provider Notes (Signed)
 [place] EMERGENCY DEPARTMENT AT [facility] Provider Note   CSN: [id number] Arrival date & time: 03/09/24  1451     Patient presents with: Abdominal Pain   Kelly Goodwin is a 63 y.o. female.   Patient to ED with epigastric abdominal pain with report of bloating across upper abdomen after eating. She reports history of hiatal hernia, on famotidine  and pantoprazole . Also history of diverticulitis and IBS. No fever, vomiting, diarrhea, bloody stools or melena. No chest pain. The epigastric pain radiates to the back at times. No cough or SOB. She reports taking BC powders and ibuprofen  for migraine headaches. No headache at present.   The history is provided by the patient. No language interpreter was used.  Abdominal Pain      Prior to Admission medications   Medication Sig Start Date End Date Taking? Authorizing Provider  HYDROcodone -acetaminophen  (NORCO/VICODIN) 5-325 MG tablet Take 1 tablet by mouth every 6 (six) hours as needed for severe pain (pain score 7-10). 03/09/24  Yes Tawnie Ehresman, PA-C  MOUNJARO 10 MG/0.5ML Pen Inject 10 mg into the skin once a week. 01/13/24  Yes [provider]  temazepam  (RESTORIL ) 15 MG capsule Take 15 mg by mouth at bedtime as needed. 01/18/24  Yes [provider]  azelaic acid  (AZELEX ) 20 % cream Apply topically 2 (two) times daily. After skin is thoroughly washed and patted dry, gently but thoroughly massage a thin film of azelaic acid  cream into the affected area twice daily, in the morning and evening. 04/11/22   Wilkinson, Dana E, NP  clindamycin  (CLEOCIN -T) 1 % lotion Apply topically in the morning. To face 06/29/23 06/28/24  Alm Delon SAILOR, DO  COLLAGEN PO Take by mouth.    [provider]  dicyclomine  (BENTYL ) 10 MG capsule TAKE 1 CAPSULE BY MOUTH THREE TIMES DAILY AS NEEDED FOR ABDOMINAL PAIN OR CRAMPING OR DIARRHEA 03/18/23   Wilkinson, Dana E, NP  estradiol -norethindrone  (ACTIVELLA) 1-0.5 MG  tablet Take 1 tablet by mouth daily. 03/18/23   Wilkinson, Dana E, NP  famotidine  (PEPCID ) 20 MG tablet Take 1 tablet (20 mg total) by mouth 2 (two) times daily. 03/31/23   Esterwood, Amy S, PA-C  folic acid (FOLVITE) 1 MG tablet Take 1 mg by mouth daily. 08/20/22   [provider]  gabapentin  (NEURONTIN ) 300 MG capsule Take 1 capsule (300 mg total) by mouth 3 (three) times daily. 02/24/22   Wilkinson, Dana E, NP  GARLIC PO Take 1 tablet by mouth 2 (two) times daily.    [provider]  ibuprofen  (ADVIL ) 800 MG tablet Take  1/2 to 1 tablet  3 x /day with Food ( every 8 hours) as needed                                                         /                                               TAKE  BY                                                                            MOUTH 12/02/22   Wilkinson, Dana E, NP  levothyroxine  (SYNTHROID ) 100 MCG tablet TAKE 1 TABLET DAILY ON AN EMPTY STOMACH WITH WATER ONLY FOR 30 MIN AND NO ANTACIDS, CALCIUM, MAGNESIUM FOR 4 HRS. AVOID BIOTIN. 01/05/23   Tonita Fallow, MD  lubiprostone  (AMITIZA ) 24 MCG capsule Take 1 capsule (24 mcg total) by mouth 2 (two) times daily with a meal. 10/28/23   Federico Rosario BROCKS, MD  metFORMIN  (GLUCOPHAGE ) 1000 MG tablet TAKE 1 TABLET BY MOUTH TWICE  DAILY FOR DIABETES 10/28/22   Wilkinson, Dana E, NP  metroNIDAZOLE  (METROGEL ) 0.75 % gel Apply 1 Application topically 2 (two) times daily. 03/25/22   Wilkinson, Dana E, NP  Multiple Vitamins-Minerals (MULTIPLE VITAMINS/WOMENS PO) Take by mouth.    [provider]  olmesartan -hydrochlorothiazide (BENICAR  HCT) 40-25 MG tablet TAKE 1 TABLET BY MOUTH  DAILY FOR BLOOD PRESSURE ,  REPLACES LOSARTAN /HCTZ  TABLET 08/14/22   Laurice President, NP  OVER THE COUNTER MEDICATION Sea moss    [provider]  pantoprazole  (PROTONIX ) 40 MG tablet Take 1 tablet (40 mg total) by mouth daily. TAKE 1 TABLET(40 MG) DAILY BY MOUTH 30-60 MINUTES BEFORE DINNER  07/29/23   Federico Rosario BROCKS, MD  rifaximin  (XIFAXAN ) 550 MG TABS tablet Take 1 tablet (550 mg total) by mouth 2 (two) times daily. 02/18/21   Eda Iha, MD  Rimegepant Sulfate (NURTEC) 75 MG TBDP Take 1 tab under tongue once daily as needed with onset of migraine. 09/20/20   Jeanine Knee, NP  spironolactone  (ALDACTONE ) 50 MG tablet Take 1 tablet (50 mg total) by mouth daily. Take one tablet daily with a full glass of water 06/29/23   Alm Delon SAILOR, DO  spironolactone  (ALDACTONE ) 50 MG tablet Take 1 tablet (50 mg total) by mouth daily. 09/15/23   Alm Delon SAILOR, DO  temazepam  (RESTORIL ) 30 MG capsule TAKE 1 CAPSULE BY MOUTH EVERY NIGHT AT BEDTIME AS NEEDED FOR INSOMNIA 05/12/23   Cranford, Tonya, NP  tretinoin  (RETIN-A ) 0.05 % cream Apply topically at bedtime. Apply a pea sized amount 3 nights weekly 06/29/23 06/28/24  Alm Delon SAILOR, DO  triamcinolone  cream (KENALOG ) 0.1 % Apply 1 Application topically 2 (two) times daily. 12/23/21   Wilkinson, Dana E, NP  valACYclovir  (VALTREX ) 500 MG tablet TAKE 1 TABLET BY MOUTH  TWICE DAILY AS NEEDED FOR  COLD SORES / FEVER BLISTERS 01/07/21   Wilkinson, Dana E, NP    Allergies: Patient has no known allergies.    Review of Systems  Gastrointestinal:  Positive for abdominal pain.    Updated Vital Signs BP 114/66 (BP Location: Right Arm)   Pulse 89   Temp 98.9 F (37.2 C) (Oral)   Resp 17   SpO2 99%   Physical Exam Constitutional:      Appearance: She is well-developed.  HENT:     Head: Normocephalic.  Cardiovascular:     Rate and Rhythm: Normal rate and regular rhythm.     Heart sounds: No murmur heard. Pulmonary:     Effort: Pulmonary effort is normal.  Breath sounds: Normal breath sounds. No wheezing, rhonchi or rales.  Chest:     Chest wall: No tenderness.  Abdominal:     General: Bowel sounds are normal. There is no distension.     Palpations: Abdomen is soft.     Tenderness: There is abdominal tenderness in the epigastric  area. There is no right CVA tenderness, left CVA tenderness, guarding or rebound.  Musculoskeletal:        General: Normal range of motion.     Cervical back: Normal range of motion and neck supple.  Skin:    General: Skin is warm and dry.  Neurological:     General: No focal deficit present.     Mental Status: She is alert and oriented to person, place, and time.     (all labs ordered are listed, but only abnormal results are displayed) Labs Reviewed  COMPREHENSIVE METABOLIC PANEL WITH GFR - Abnormal; Notable for the following components:      Result Value   Sodium 134 (*)    Glucose, Bld 100 (*)    AST 14 (*)    All other components within normal limits  URINALYSIS, ROUTINE W REFLEX MICROSCOPIC - Abnormal; Notable for the following components:   Hgb urine dipstick TRACE (*)    Bacteria, UA RARE (*)    All other components within normal limits  LIPASE, BLOOD  CBC    EKG: None  Radiology: CT ABDOMEN PELVIS W CONTRAST Result Date: 03/09/2024 CLINICAL DATA:  Abdomen pain bloating nausea diarrhea EXAM: CT ABDOMEN AND PELVIS WITH CONTRAST TECHNIQUE: Multidetector CT imaging of the abdomen and pelvis was performed using the standard protocol following bolus administration of intravenous contrast. RADIATION DOSE REDUCTION: This exam was performed according to the departmental dose-optimization program which includes automated exposure control, adjustment of the mA and/or kV according to patient size and/or use of iterative reconstruction technique. CONTRAST:  OMNIPAQUE  IOHEXOL  300 MG/ML  SOLN COMPARISON:  CT 06/16/2023, 10/19/2019 FINDINGS: Lower chest: Lung bases demonstrate no acute airspace disease. Mild coronary vascular calcification Hepatobiliary: No focal liver abnormality is seen. Status post cholecystectomy. Mildly prominent common bile duct probably due to surgical change. Pancreas: No inflammation.  Prominent pancreatic duct as before. Spleen: Normal in size without focal  abnormality. Adrenals/Urinary Tract: Adrenal glands are unremarkable. Kidneys are normal, without renal calculi, focal lesion, or hydronephrosis. Bladder is unremarkable. Stomach/Bowel: Stomach within normal limits. No dilated small bowel. Negative appendix. Moderate stool in the colon. Diverticular disease of the left colon without acute inflammatory process. Vascular/Lymphatic: Aortic atherosclerosis. No enlarged abdominal or pelvic lymph nodes. Reproductive: Fibroid uterus.  No adnexal mass Other: Is no ascites or free air. Small fat containing umbilical hernia Musculoskeletal: No acute osseous abnormality IMPRESSION: 1. No CT evidence for acute intra-abdominal or pelvic abnormality. 2. Diverticular disease of the left colon without acute inflammatory process. 3. Fibroid uterus. 4. Aortic atherosclerosis. Aortic Atherosclerosis (ICD10-I70.0). Electronically Signed   By: Luke Bun M.D.   On: 03/09/2024 18:12     Procedures   Medications Ordered in the ED  iohexol  (OMNIPAQUE ) 300 MG/ML solution 100 mL (100 mLs Intravenous Contrast Given 03/09/24 1718)    Clinical Course as of 03/09/24 1845  Wed Mar 09, 2024  1641 Patient presents with ss/sxs as detailed in the HPI. Very well appearing. Labs ordered through triage and reviewed. No concerning values - no leukocytosis, normal hgb, electrolyes WNL, normal renal function. No evidence UTI.   Discussed imaging in shared decision making with the  patient who reports she is concerned about her pain w/history of hiatal hernia. Her mother died from complications of hiatal hernia repair and this causes her anxiety. Will obtain CT abd/pel for full evaluation.  [SU]  1844 CT without abnormality. No diverticulitis, perforation, infection, inflammatory process. Discussed she should stop NSAIDs, which is likely contributing to her symptoms. Will double her pantoprazole  to BID dosing x 1 week. She should plan to see her doctor for recheck in close follow up.  Discussed return precautions. #6 Norco provided by prescription for prn severe pain.  [SU]    Clinical Course User Index [SU] Odell Balls, PA-C                                 Medical Decision Making Amount and/or Complexity of Data Reviewed Labs: ordered. Radiology: ordered.  Risk Prescription drug management.        Final diagnoses:  Dyspepsia    ED Discharge Orders          Ordered    HYDROcodone -acetaminophen  (NORCO/VICODIN) 5-325 MG tablet  Every 6 hours PRN        03/09/24 1839               Odell Balls, PA-C 03/09/24 1845    Dreama Longs, MD 03/10/24 1530

## 2024-03-09 NOTE — Discharge Instructions (Signed)
 As we discussed, your CT of the abdomen did not show any concerning findings. No recurrent diverticulitis. For the symptoms you are having, recommend stopping all BC powder and ibuprofen  for at least the next few days. Take your pantoprazole  twice daily for the next week. You can take Norco for severe pain if needed.   There is moderate stool in the colon on your imaging study. It may help relieve symptoms to use a mild laxative for 2-3 days, ie Dulcolax or Miralax.   Follow up with your doctor for recheck and re-examination to ensure symptoms are improving. Return to the ED with any new or worsening symptoms of concern at any time.

## 2024-03-09 NOTE — ED Triage Notes (Signed)
 Patient reports abdominal pain with bloating, nausea, and diarrhea for several days. Hx of diverticulitis.

## 2024-03-09 NOTE — ED Notes (Signed)
 Patient transported to CT

## 2024-03-16 NOTE — Progress Notes (Signed)
 03/18/2024 Wilene Pharo [id number] Apr 29, 1960  Referring provider: No ref. provider found Primary GI doctor: Dr. Federico  ASSESSMENT AND PLAN:  GERD with epigastric discomfort/small HH, worsening symptoms after illness 2 weeks ago, continuing bloating, epigastric AB pain, on bland diet, passing gas, denies full BM's 04/01/2021 EGD normal esophagus, gastritis normal duodenum negative celiac, H. pylori, EOE 03/09/2024 CTAP W for abdominal pain no acute abnormality diverticular disease left colon no inflammation fibroid uterus aortic atherosclerosis 12 mm hypodensity off the inferior spleen stable compared to most recent study Rare goody powders, ibuprofen , stopped since ER 11/12, denies ETOH Previous xifaxan  treatment that helped On PPI and H2, increase PPI to BID in the ER 11/12 On Mounjaro for 6 months - -Continue taking Amitiza  twice daily. -We administered a bowel purge during your visit. -An abdominal x-ray has been ordered to further investigate your symptoms. -We provided you with information on Mounjaro and GLP-1s. -If Amitiza  is not effective, we may consider Motegrity or Isbrela. -FDGard samples -Lifestyle changes discussed, avoid NSAIDS, ETOH, hand out given to the patient -consider EGD  Chronic constipation 06/16/2023 CTAP W minimal acute diverticulitis descending colon no perforation or abscess bilateral nonobstructing renal calculi uterine fibroid small [facility] 03/09/2024 CTAP W for abdominal pain no acute abnormality diverticular disease left colon no inflammation fibroid uterus aortic atherosclerosis 12 mm hypodensity off the inferior spleen stable compared to most recent study On Amitiza  but still no full evacuation, small volume but stopped when she sent to the ER Failed Linzess , Benefiber, MiraLAX, hot prune juice - -Continue taking Amitiza  twice daily. -We administered a bowel purge during your visit. -An abdominal x-ray has been ordered to further  investigate your symptoms. -We provided you with information on Mounjaro and GLP-1s. -If Amitiza  is not effective, we may consider Motegrity or Isbrela.  History of diverticulitis 06/16/2023 CTAP W minimal acute diverticulitis descending colon no perforation or abscess bilateral nonobstructing renal calculi uterine fibroid small [facility] 03/09/2024 CTAP W for abdominal pain no acute abnormality diverticular disease left colon no inflammation fibroid uterus aortic atherosclerosis 12 mm hypodensity off the inferior spleen stable compared to most recent study No evidence at this time of inflammation  History of colon polyps 2022 colonoscopy poor preparation no specimens collected 05/07/2021 colonoscopy diverticulosis sigmoid colon 1 polyp 4 to 5 mm sigmoid colon 3 mm polyp transverse colon both were TA polyps Recall colonoscopy 04/2028 with 2 day prep  Hepatic Steatosis seen on imaging without LFT elevation    Latest Ref Rng & Units 03/09/2024    3:05 PM 03/18/2023    4:53 PM 12/02/2022    4:30 PM  Hepatic Function  Total Protein 6.5 - 8.1 g/dL 7.1  7.0  6.9   Albumin 3.5 - 5.0 g/dL 4.1     AST 15 - 41 U/L 14  16  18    ALT 0 - 44 U/L 11  16  16    Alk Phosphatase 38 - 126 U/L 65     Total Bilirubin 0.0 - 1.2 mg/dL 0.3  0.3  0.4    Platelets 358  - need LFTs and CBC monitored every 6 months, - evaluation with imaging every 2-3 years.  - Encouraged diet/exercise for modest 10% body weight loss as treatment for hepatic steatosis -Continue to work on risk factor modification including diet exercise and control of risk factors including blood sugars.   Patient Care Team: Patient, No Pcp Per as PCP - General (General Practice) Rutherford Gain, MD as Consulting  Physician (Obstetrics and Gynecology)  HISTORY OF PRESENT ILLNESS: 63 y.o. female with a past medical history  of IBS, prior diverticulitis, PCOS, obesity, endometriosis, uterine fibroids, fatty liver on ultrasound, minimally prominent  pancreatic duct on prior imaging, GERD, and prior cholecystectomy and others listed below presents for evaluation of epigastric AB pain.   Last seen in the office 10/28/2023 by Dr. Federico for abdominal pain and constipation  Discussed the use of AI scribe software for clinical note transcription with the patient, who gave verbal consent to proceed.  History of Present Illness   Lalanya Irelyn Perfecto is a 63 year old female with a history of hiatal hernia who presents with persistent abdominal pain and bloating.  Two weeks ago, she experienced severe food poisoning with vomiting and diarrhea. A few days later, her abdomen became swollen and hard, with pain radiating to her back. She visited the emergency room over a week ago due to these symptoms, which have persisted since then.  She continues to experience nausea, gas, acid reflux, and indigestion. Despite adhering to a bland diet of applesauce, mashed potatoes, crackers, avocado, and water, there has been no improvement. She is taking a proton pump inhibitor twice daily, but Pepto, Gas-X, and [place] have not been effective.  She has a history of using Mounjaro for about six months, though she occasionally skips doses. She was previously on Linzess  but discontinued it due to dissatisfaction. Amitiza  was prescribed but stopped during her recent stomach issues, fearing it would worsen her symptoms. She has not had significant bowel movements, only passing gas and minimal stool, and has avoided laxatives despite stool being found on a CT scan.  She recalls a past stomach infection treated with antibiotics, which was effective. She wonders if her current symptoms might be due to a similar infection, possibly SIBO, as she experiences significant gas and bloating. Her abdomen is hard and swollen, particularly when standing, and she experiences belching even with water intake.  No current use of ibuprofen , Goody Powders, or Aleve. She has not consumed  alcohol. The persistent swelling and pain have impacted her ability to eat and participate in activities, leading her to cancel Thanksgiving plans.      She  reports that she has never smoked. She has never used smokeless tobacco. She reports current alcohol use. She reports that she does not use drugs.  RELEVANT GI HISTORY, IMAGING AND LABS: Results          CBC    Component Value Date/Time   WBC 9.0 03/09/2024 1505   RBC 4.13 03/09/2024 1505   HGB 12.5 03/09/2024 1505   HCT 37.8 03/09/2024 1505   PLT 358 03/09/2024 1505   MCV 91.5 03/09/2024 1505   MCH 30.3 03/09/2024 1505   MCHC 33.1 03/09/2024 1505   RDW 14.5 03/09/2024 1505   LYMPHSABS 1,853 12/02/2022 1630   MONOABS 1.0 10/18/2019 1439   EOSABS 324 03/18/2023 1653   BASOSABS 79 03/18/2023 1653   Recent Labs    06/16/23 1814 03/09/24 1505  HGB 11.4* 12.5    CMP     Component Value Date/Time   NA 134 (L) 03/09/2024 1505   K 4.1 03/09/2024 1505   CL 102 03/09/2024 1505   CO2 23 03/09/2024 1505   GLUCOSE 100 (H) 03/09/2024 1505   BUN 15 03/09/2024 1505   CREATININE 0.75 03/09/2024 1505   CREATININE 0.82 03/18/2023 1653   CALCIUM 9.5 03/09/2024 1505   PROT 7.1 03/09/2024 1505   ALBUMIN 4.1 03/09/2024 1505  AST 14 (L) 03/09/2024 1505   ALT 11 03/09/2024 1505   ALKPHOS 65 03/09/2024 1505   BILITOT 0.3 03/09/2024 1505   GFRNONAA >60 03/09/2024 1505   GFRNONAA 99 09/20/2020 1701   GFRAA 115 09/20/2020 1701      Latest Ref Rng & Units 03/09/2024    3:05 PM 03/18/2023    4:53 PM 12/02/2022    4:30 PM  Hepatic Function  Total Protein 6.5 - 8.1 g/dL 7.1  7.0  6.9   Albumin 3.5 - 5.0 g/dL 4.1     AST 15 - 41 U/L 14  16  18    ALT 0 - 44 U/L 11  16  16    Alk Phosphatase 38 - 126 U/L 65     Total Bilirubin 0.0 - 1.2 mg/dL 0.3  0.3  0.4       Current Medications:   Current Outpatient Medications (Endocrine & Metabolic):    estradiol -norethindrone  (ACTIVELLA) 1-0.5 MG tablet, Take 1 tablet by mouth  daily.   levothyroxine  (SYNTHROID ) 100 MCG tablet, TAKE 1 TABLET DAILY ON AN EMPTY STOMACH WITH WATER ONLY FOR 30 MIN AND NO ANTACIDS, CALCIUM, MAGNESIUM FOR 4 HRS. AVOID BIOTIN.   metFORMIN  (GLUCOPHAGE ) 1000 MG tablet, TAKE 1 TABLET BY MOUTH TWICE  DAILY FOR DIABETES   MOUNJARO 10 MG/0.5ML Pen, Inject 10 mg into the skin once a week.  Current Outpatient Medications (Cardiovascular):    olmesartan -hydrochlorothiazide (BENICAR  HCT) 40-25 MG tablet, TAKE 1 TABLET BY MOUTH  DAILY FOR BLOOD PRESSURE ,  REPLACES LOSARTAN /HCTZ  TABLET   spironolactone  (ALDACTONE ) 50 MG tablet, Take 1 tablet (50 mg total) by mouth daily. Take one tablet daily with a full glass of water   spironolactone  (ALDACTONE ) 50 MG tablet, Take 1 tablet (50 mg total) by mouth daily.   Current Outpatient Medications (Analgesics):    HYDROcodone -acetaminophen  (NORCO/VICODIN) 5-325 MG tablet, Take 1 tablet by mouth every 6 (six) hours as needed for severe pain (pain score 7-10).   ibuprofen  (ADVIL ) 800 MG tablet, Take  1/2 to 1 tablet  3 x /day with Food ( every 8 hours) as needed                                                         /                                               TAKE                                    BY                                                                            MOUTH   Rimegepant Sulfate (NURTEC) 75 MG TBDP, Take 1 tab under tongue once daily as needed with onset of  migraine.  Current Outpatient Medications (Hematological):    folic acid (FOLVITE) 1 MG tablet, Take 1 mg by mouth daily.  Current Outpatient Medications (Other):    azelaic acid  (AZELEX ) 20 % cream, Apply topically 2 (two) times daily. After skin is thoroughly washed and patted dry, gently but thoroughly massage a thin film of azelaic acid  cream into the affected area twice daily, in the morning and evening.   clindamycin  (CLEOCIN -T) 1 % lotion, Apply topically in the morning. To face   COLLAGEN PO, Take by mouth.   dicyclomine   (BENTYL ) 10 MG capsule, TAKE 1 CAPSULE BY MOUTH THREE TIMES DAILY AS NEEDED FOR ABDOMINAL PAIN OR CRAMPING OR DIARRHEA   famotidine  (PEPCID ) 20 MG tablet, Take 1 tablet (20 mg total) by mouth 2 (two) times daily.   gabapentin  (NEURONTIN ) 300 MG capsule, Take 1 capsule (300 mg total) by mouth 3 (three) times daily.   GARLIC PO, Take 1 tablet by mouth 2 (two) times daily.   lubiprostone  (AMITIZA ) 24 MCG capsule, Take 1 capsule (24 mcg total) by mouth 2 (two) times daily with a meal.   metroNIDAZOLE  (METROGEL ) 0.75 % gel, Apply 1 Application topically 2 (two) times daily.   Multiple Vitamins-Minerals (MULTIPLE VITAMINS/WOMENS PO), Take by mouth.   neomycin  (MYCIFRADIN ) 500 MG tablet, Take 1 tablet (500 mg total) by mouth 2 (two) times daily for 7 days.   OVER THE COUNTER MEDICATION, Sea moss   pantoprazole  (PROTONIX ) 40 MG tablet, Take 1 tablet (40 mg total) by mouth daily. TAKE 1 TABLET(40 MG) DAILY BY MOUTH 30-60 MINUTES BEFORE DINNER   rifaximin  (XIFAXAN ) 550 MG TABS tablet, Take 1 tablet (550 mg total) by mouth 3 (three) times daily for 14 days.   temazepam  (RESTORIL ) 15 MG capsule, Take 15 mg by mouth at bedtime as needed.   temazepam  (RESTORIL ) 30 MG capsule, TAKE 1 CAPSULE BY MOUTH EVERY NIGHT AT BEDTIME AS NEEDED FOR INSOMNIA   tretinoin  (RETIN-A ) 0.05 % cream, Apply topically at bedtime. Apply a pea sized amount 3 nights weekly   triamcinolone  cream (KENALOG ) 0.1 %, Apply 1 Application topically 2 (two) times daily.   valACYclovir  (VALTREX ) 500 MG tablet, TAKE 1 TABLET BY MOUTH  TWICE DAILY AS NEEDED FOR  COLD SORES / FEVER BLISTERS  Medical History:  Past Medical History:  Diagnosis Date   Arthritis    Constipation, chronic    Diverticulitis    Diverticulosis    Fibroid uterus    History of gallstones    Hyperlipidemia    Hypertension    Hypothyroidism    Migraine    PCOS (polycystic ovarian syndrome)    takes metformin  for this   Prediabetes    Thyroid  disease    Allergies:  No Known Allergies   Surgical History:  She  has a past surgical history that includes Breast reduction surgery (1988); Cholecystectomy (1995); Tonsillectomy and adenoidectomy (1990's); LASIK (Left); Refractive surgery (Left); Breast surgery; and Colonoscopy. Family History:  Her family history includes COPD in her mother; Diabetes in her mother and sister; Diverticulitis in her mother; Heart disease in her mother; Leukemia in her mother; Lung cancer in her mother.  REVIEW OF SYSTEMS  : All other systems reviewed and negative except where noted in the History of Present Illness.  PHYSICAL EXAM: BP 96/60 (BP Location: Right Arm, Patient Position: Sitting, Cuff Size: Normal)   Pulse 84   Ht 5' (1.524 m)   Wt 164 lb (74.4 kg)   BMI 32.03 kg/m  Physical  Exam   GENERAL APPEARANCE: Well nourished, in no apparent distress. HEENT: No cervical lymphadenopathy, unremarkable thyroid , sclerae anicteric, conjunctiva pink. RESPIRATORY: Respiratory effort normal, breath sounds equal bilaterally without rales, rhonchi, or wheezing. CARDIO: Regular rate and rhythm with no murmurs, rubs, or gallops, peripheral pulses intact. ABDOMEN: Soft, non-distended, decreased bowel sounds, tenderness throughout abdomen, no rebound, no mass appreciated. RECTAL: Declines. MUSCULOSKELETAL: Full range of motion, normal gait, without edema. SKIN: Dry, intact without rashes or lesions. No jaundice. NEURO: Alert, oriented, no focal deficits. PSYCH: Cooperative, normal mood and affect.      Alan JONELLE Coombs, PA-C 10:08 AM

## 2024-03-18 ENCOUNTER — Ambulatory Visit (INDEPENDENT_AMBULATORY_CARE_PROVIDER_SITE_OTHER)
Admission: RE | Admit: 2024-03-18 | Discharge: 2024-03-18 | Disposition: A | Discharge status: Home / Self Care | Source: Ambulatory Visit | Attending: Physician Assistant | Admitting: Physician Assistant

## 2024-03-18 ENCOUNTER — Other Ambulatory Visit (INDEPENDENT_AMBULATORY_CARE_PROVIDER_SITE_OTHER)

## 2024-03-18 ENCOUNTER — Ambulatory Visit: Admitting: Physician Assistant

## 2024-03-18 ENCOUNTER — Ambulatory Visit: Payer: Self-pay | Admitting: Physician Assistant

## 2024-03-18 ENCOUNTER — Encounter: Payer: Self-pay | Admitting: Physician Assistant

## 2024-03-18 VITALS — BP 96/60 | HR 84 | Ht 60.0 in | Wt 164.0 lb

## 2024-03-18 DIAGNOSIS — K5790 Diverticulosis of intestine, part unspecified, without perforation or abscess without bleeding: Secondary | ICD-10-CM | POA: Diagnosis not present

## 2024-03-18 DIAGNOSIS — K5904 Chronic idiopathic constipation: Secondary | ICD-10-CM

## 2024-03-18 DIAGNOSIS — K589 Irritable bowel syndrome without diarrhea: Secondary | ICD-10-CM

## 2024-03-18 DIAGNOSIS — K760 Fatty (change of) liver, not elsewhere classified: Secondary | ICD-10-CM

## 2024-03-18 DIAGNOSIS — K219 Gastro-esophageal reflux disease without esophagitis: Secondary | ICD-10-CM | POA: Diagnosis not present

## 2024-03-18 DIAGNOSIS — Z860100 Personal history of colon polyps, unspecified: Secondary | ICD-10-CM

## 2024-03-18 LAB — SEDIMENTATION RATE: Sed Rate: 13 mm/h (ref 0–30)

## 2024-03-18 LAB — CBC WITH DIFFERENTIAL/PLATELET
Basophils Absolute: 0.1 K/uL (ref 0.0–0.1)
Basophils Relative: 0.9 % (ref 0.0–3.0)
Eosinophils Absolute: 0.1 K/uL (ref 0.0–0.7)
Eosinophils Relative: 1.9 % (ref 0.0–5.0)
HCT: 37.2 % (ref 36.0–46.0)
Hemoglobin: 12.4 g/dL (ref 12.0–15.0)
Lymphocytes Relative: 27.3 % (ref 12.0–46.0)
Lymphs Abs: 1.8 K/uL (ref 0.7–4.0)
MCHC: 33.4 g/dL (ref 30.0–36.0)
MCV: 91.5 fl (ref 78.0–100.0)
Monocytes Absolute: 0.6 K/uL (ref 0.1–1.0)
Monocytes Relative: 9 % (ref 3.0–12.0)
Neutro Abs: 3.9 K/uL (ref 1.4–7.7)
Neutrophils Relative %: 60.9 % (ref 43.0–77.0)
Platelets: 348 K/uL (ref 150.0–400.0)
RBC: 4.06 Mil/uL (ref 3.87–5.11)
RDW: 14.9 % (ref 11.5–15.5)
WBC: 6.4 K/uL (ref 4.0–10.5)

## 2024-03-18 LAB — COMPREHENSIVE METABOLIC PANEL WITH GFR
ALT: 26 U/L (ref 0–35)
AST: 14 U/L (ref 0–37)
Albumin: 4.1 g/dL (ref 3.5–5.2)
Alkaline Phosphatase: 51 U/L (ref 39–117)
BUN: 10 mg/dL (ref 6–23)
CO2: 28 meq/L (ref 19–32)
Calcium: 9.9 mg/dL (ref 8.4–10.5)
Chloride: 101 meq/L (ref 96–112)
Creatinine, Ser: 0.9 mg/dL (ref 0.40–1.20)
GFR: 67.92 mL/min (ref >60.00)
Glucose, Bld: 116 mg/dL — ABNORMAL HIGH (ref 70–99)
Potassium: 4.1 meq/L (ref 3.5–5.1)
Sodium: 137 meq/L (ref 135–145)
Total Bilirubin: 0.4 mg/dL (ref 0.2–1.2)
Total Protein: 7.1 g/dL (ref 6.0–8.3)

## 2024-03-18 MED ORDER — NEOMYCIN SULFATE 500 MG PO TABS
500.0000 mg | ORAL_TABLET | Freq: Two times a day (BID) | ORAL | 0 refills | Status: AC
Start: 2024-03-18 — End: 2024-03-25

## 2024-03-18 MED ORDER — RIFAXIMIN 550 MG PO TABS
550.0000 mg | ORAL_TABLET | Freq: Three times a day (TID) | ORAL | 0 refills | Status: AC
Start: 2024-03-18 — End: 2024-04-01

## 2024-03-18 NOTE — Patient Instructions (Addendum)
 _______________________________________________________  If your blood pressure at your visit was 140/90 or greater, please contact your primary care physician to follow up on this.  _______________________________________________________  If you are age 63 or older, your body mass index should be between 23-30. Your Body mass index is 32.03 kg/m. If this is out of the aforementioned range listed, please consider follow up with your Primary Care Provider.  If you are age 27 or younger, your body mass index should be between 19-25. Your Body mass index is 32.03 kg/m. If this is out of the aformentioned range listed, please consider follow up with your Primary Care Provider.   ________________________________________________________  The [facility] providers would like to encourage you to use MYCHART to communicate with providers for non-urgent requests or questions.  Due to long hold times on the telephone, sending your provider a message by [facility] may be a faster and more efficient way to get a response.  Please allow 48 business hours for a response.  Please remember that this is for non-urgent requests.  _______________________________________________________  [facility] is using a team-based approach to care.  Your team is made up of your doctor and two to three APPS. Our APPS (Nurse Practitioners and Physician Assistants) work with your physician to ensure care continuity for you. They are fully qualified to address your health concerns and develop a treatment plan. They communicate directly with your gastroenterologist to care for you. Seeing the Advanced Practice Practitioners on your physician's team can help you by facilitating care more promptly, often allowing for earlier appointments, access to diagnostic testing, procedures, and other specialty referrals.   I'm a fan of Lauraine Pereyra, NP with [place] at [employer], Tinnie Arlean Harada, NP at [facility], and  Samantha J. Job, [place] at [facility].   Your provider has requested that you go to the basement level for lab work before leaving today. Press B on the elevator. The lab is located at the first door on the left as you exit the elevator.  Your provider has requested that you have an abdominal x ray before leaving today. Please go to the basement floor to our Radiology department for the test.  You have been scheduled for an appointment with Alan Coombs PA-C on 04-15-24 at 840am . Please arrive 10 minutes early for your appointment.  We have given you samples of the following medication to take: FDgard IBSrela  Please do the following: Purchase a bottle of Miralax over the counter as well as a box of 5 mg dulcolax tablets. Take 4 dulcolax tablets. Wait 1 hour. You will then drink 6-8 capfuls of Miralax mixed in an adequate amount of water/juice/gatorade (you may choose which of these liquids to drink) over the next 2-3 hours. You should expect results within 1 to 6 hours after completing the bowel purge. Go to the er if you have severe AB pain, can not pass gas or stool in over 12 hours, can not hold down any food.    Understanding Your Weekly GLP-1 Injection  A helpful guide to managing common side effects  You are on a once-weekly injectable medication in the GLP-1 receptor agonist class. These medications can be very effective for blood sugar control, weight loss, and heart protection, fatty liver or OSA, but they can also come with some side effects that are important to understand. The good news is: most side effects can be managed with a few adjustments.  1. Gastroparesis-Like Symptoms These medications slow  down your stomach to help you feel full longer -- great for weight loss and blood sugar control, but they can sometimes cause symptoms that feel like gastroparesis (slow stomach emptying). Symptoms may include: -Feeling full quickly when eating -Nausea or  vomiting -Bloating or abdominal discomfort -Worsening heartburn or reflux -Acid regurgitation -Stomach spasms or tightness What you can do: ??? Eat small, frequent meals (4-6 per day) ?? Drink fluids between meals, not during ?? Avoid high-fiber foods (like raw veggies or whole grains); cook your veggies well ?? Spread protein throughout the day (try Greek yogurt, eggs, soft meats, Glucerna, milk) ?? Choose soft foods you can mash with a fork ?? Switch to pured foods or liquids during flare-ups ?? Consider reading: Living Well with Gastroparesis by Camelia Bone ?? Downloadable Diet Guide: [facility] Gastroparesis Diet PDF  ?? Tip: Try following a gastroparesis-friendly diet on the day of your injection and the day after, when the medication's effect is strongest.  2. Constipation Since this medication slows down your digestive system, constipation is very common. Tips to help: ?? Drink plenty of water ???? Stay active with regular exercise ?? Add fiber-rich but gentle foods like kiwi ?? Try a low-dose magnesium supplement at night ?? Use MiraLAX (half to one capful daily) if constipation becomes more frequent, especially if your dose increases  If these strategies don't help, talk to your provider -- they may recommend or prescribe other treatments.  3. When to Call the Doctor or Go to the ER While rare, this medication can slightly increase your risk of serious conditions like: Pancreatitis (inflammation of the pancreas) Gallstones or gallbladder problems Watch for these signs and seek help if you experience: Severe abdominal pain (especially in the upper belly or that radiates to your back) Pain in the right upper side of your abdomen Nausea, vomiting, fever, or chills that don't go away ?? Call your provider or go to the ER if these occur.  4. Who Should NOT Take This Medication? This medication should be avoided if you have: A personal history of pancreatitis   A personal or family history of medullary thyroid  cancer A condition called Multiple Endocrine Neoplasia Syndrome Type 2 (MEN2)  Final Note If the side effects are too bothersome, remember: Most symptoms will go away if you stop the medication. But many people tolerate it well after the first few weeks, especially with the right strategies in place.  Please take your proton pump inhibitor medication, pantoprazole  40 mg TWICE a day  Please take this medication 30 minutes to 1 hour before meals- this makes it more effective.  Avoid spicy and acidic foods Avoid fatty foods Limit your intake of coffee, tea, alcohol, and carbonated drinks Work to maintain a healthy weight Keep the head of the bed elevated at least 3 inches with blocks or a wedge pillow if you are having any nighttime symptoms Stay upright for 2 hours after eating Avoid meals and snacks three to four hours before bedtime  Recommend starting on a fiber supplement, can try metamucil first but if this causes gas/bloating switch to benefiber or citracel, these do not cause gas.  Take with fiber with with a full 8 oz glass of water once a day. This can take 1 month to start helping, so try for at least one month.  Recommend increasing water and physical activity.   - Drink at least 64-80 ounces of water/liquid per day. - Establish a time to try to move your bowels every  day.  For many people, this is after a cup of coffee or after a meal such as breakfast. - Sit all of the way back on the toilet keeping your back fairly straight and while sitting up, try to rest the tops of your forearms on your upper thighs.   - Raising your feet with a step stool/squatty potty can be helpful to improve the angle that allows your stool to pass through the rectum. - Relax the rectum feeling it bulge toward the toilet water.  If you feel your rectum raising toward your body, you are contracting rather than relaxing. - Breathe in and slowly exhale.  Belly breath by expanding your belly towards your belly button. Keep belly expanded as you gently direct pressure down and back to the anus.  A low pitched GRRR sound can assist with increasing intra-abdominal pressure.  (Can also trying to blow on a pinwheel and make it move, this helps with the same belly breathing) - Repeat 3-4 times. If unsuccessful, contract the pelvic floor to restore normal tone and get off the toilet.  Avoid excessive straining. - To reduce excessive wiping by teaching your anus to normally contract, place hands on outer aspect of knees and resist knee movement outward.  Hold 5-10 second then place hands just inside of knees and resist inward movement of knees.  Hold 5 seconds.  Repeat a few times each way.  Go to the ER if unable to pass gas, severe AB pain, unable to hold down food, any shortness of breath of chest pain.

## 2024-03-21 ENCOUNTER — Other Ambulatory Visit (HOSPITAL_COMMUNITY): Payer: Self-pay

## 2024-03-23 NOTE — Telephone Encounter (Signed)
 Secure staff message sent to [facility], CMA to add patient to Xifaxan  wait list.

## 2024-03-23 NOTE — Telephone Encounter (Signed)
 Patient requesting call regarding previous note. Please advise, thank you.

## 2024-04-14 NOTE — Progress Notes (Unsigned)
 04/14/2024 Kelly Goodwin [id number] 14-May-1960  Referring provider: No ref. provider found Primary GI doctor: Dr. Federico  ASSESSMENT AND PLAN:  GERD with epigastric discomfort/small HH, worsening symptoms after illness 2 weeks ago, continuing bloating, epigastric AB pain, on bland diet, passing gas, denies full BM's 04/01/2021 EGD normal esophagus, gastritis normal duodenum negative celiac, H. pylori, EOE 03/09/2024 CTAP W for abdominal pain no acute abnormality diverticular disease left colon no inflammation fibroid uterus aortic atherosclerosis 12 mm hypodensity off the inferior spleen stable compared to most recent study 03/18/2024 KUB moderate colonic stool burden greatest in right and left colon Trial of bowel purge with Amitiza  twice daily Consider Isbrela Rare goody powders, ibuprofen , stopped since ER 11/12, denies ETOH Previous xifaxan  treatment that helped On Mounjaro for 6 months - -Continue taking Amitiza  twice daily. -We administered a bowel purge during your visit. -If Amitiza  is not effective, we may consider Motegrity or Isbrela. -FDGard samples -Lifestyle changes discussed, avoid NSAIDS, ETOH, hand out given to the patient -consider EGD  Chronic constipation 06/16/2023 CTAP W minimal acute diverticulitis descending colon no perforation or abscess bilateral nonobstructing renal calculi uterine fibroid small [facility] 03/09/2024 CTAP W for abdominal pain no acute abnormality diverticular disease left colon no inflammation fibroid uterus aortic atherosclerosis 12 mm hypodensity off the inferior spleen stable compared to most recent study On Amitiza  but still no full evacuation, small volume but stopped when she sent to the ER Failed Linzess , Benefiber, MiraLAX, hot prune juice - -Continue taking Amitiza  twice daily. -We administered a bowel purge during your visit. -An abdominal x-ray has been ordered to further investigate your symptoms. -We provided you with  information on Mounjaro and GLP-1s. -If Amitiza  is not effective, we may consider Motegrity or Isbrela.  History of diverticulitis 06/16/2023 CTAP W minimal acute diverticulitis descending colon no perforation or abscess bilateral nonobstructing renal calculi uterine fibroid small [facility] 03/09/2024 CTAP W for abdominal pain no acute abnormality diverticular disease left colon no inflammation fibroid uterus aortic atherosclerosis 12 mm hypodensity off the inferior spleen stable compared to most recent study No evidence at this time of inflammation  History of colon polyps 2022 colonoscopy poor preparation no specimens collected 05/07/2021 colonoscopy diverticulosis sigmoid colon 1 polyp 4 to 5 mm sigmoid colon 3 mm polyp transverse colon both were TA polyps Recall colonoscopy 04/2028 with 2 day prep  Hepatic Steatosis seen on imaging without LFT elevation    Latest Ref Rng & Units 03/18/2024   10:05 AM 03/09/2024    3:05 PM 03/18/2023    4:53 PM  Hepatic Function  Total Protein 6.0 - 8.3 g/dL 7.1  7.1  7.0   Albumin 3.5 - 5.2 g/dL 4.1  4.1    AST 0 - 37 U/L 14  14  16    ALT 0 - 35 U/L 26  11  16    Alk Phosphatase 39 - 117 U/L 51  65    Total Bilirubin 0.2 - 1.2 mg/dL 0.4  0.3  0.3    Platelets 358  - need LFTs and CBC monitored every 6 months, - evaluation with imaging every 2-3 years.  - Encouraged diet/exercise for modest 10% body weight loss as treatment for hepatic steatosis -Continue to work on risk factor modification including diet exercise and control of risk factors including blood sugars.   Patient Care Team: Patient, No Pcp Per as PCP - General (General Practice) Rutherford Gain, MD as Consulting Physician (Obstetrics and Gynecology)  HISTORY OF PRESENT ILLNESS:  63 y.o. female with a past medical history  of IBS, prior diverticulitis, PCOS, obesity, endometriosis, uterine fibroids, fatty liver on ultrasound, minimally prominent pancreatic duct on prior imaging, GERD, and  prior cholecystectomy and others listed below presents for evaluation of epigastric AB pain.   I last saw the patient the office 03/18/2024 for chronic constipation and reflux.  Discussed the use of AI scribe software for clinical note transcription with the patient, who gave verbal consent to proceed.  History of Present Illness          She  reports that she has never smoked. She has never used smokeless tobacco. She reports current alcohol use. She reports that she does not use drugs.  RELEVANT GI HISTORY, IMAGING AND LABS: Results           CBC    Component Value Date/Time   WBC 6.4 03/18/2024 1005   RBC 4.06 03/18/2024 1005   HGB 12.4 03/18/2024 1005   HCT 37.2 03/18/2024 1005   PLT 348.0 03/18/2024 1005   MCV 91.5 03/18/2024 1005   MCH 30.3 03/09/2024 1505   MCHC 33.4 03/18/2024 1005   RDW 14.9 03/18/2024 1005   LYMPHSABS 1.8 03/18/2024 1005   MONOABS 0.6 03/18/2024 1005   EOSABS 0.1 03/18/2024 1005   BASOSABS 0.1 03/18/2024 1005   Recent Labs    06/16/23 1814 03/09/24 1505 03/18/24 1005  HGB 11.4* 12.5 12.4    CMP     Component Value Date/Time   NA 137 03/18/2024 1005   K 4.1 03/18/2024 1005   CL 101 03/18/2024 1005   CO2 28 03/18/2024 1005   GLUCOSE 116 (H) 03/18/2024 1005   BUN 10 03/18/2024 1005   CREATININE 0.90 03/18/2024 1005   CREATININE 0.82 03/18/2023 1653   CALCIUM 9.9 03/18/2024 1005   PROT 7.1 03/18/2024 1005   ALBUMIN 4.1 03/18/2024 1005   AST 14 03/18/2024 1005   ALT 26 03/18/2024 1005   ALKPHOS 51 03/18/2024 1005   BILITOT 0.4 03/18/2024 1005   GFRNONAA >60 03/09/2024 1505   GFRNONAA 99 09/20/2020 1701   GFRAA 115 09/20/2020 1701      Latest Ref Rng & Units 03/18/2024   10:05 AM 03/09/2024    3:05 PM 03/18/2023    4:53 PM  Hepatic Function  Total Protein 6.0 - 8.3 g/dL 7.1  7.1  7.0   Albumin 3.5 - 5.2 g/dL 4.1  4.1    AST 0 - 37 U/L 14  14  16    ALT 0 - 35 U/L 26  11  16    Alk Phosphatase 39 - 117 U/L 51  65    Total  Bilirubin 0.2 - 1.2 mg/dL 0.4  0.3  0.3       Current Medications:   Current Outpatient Medications (Endocrine & Metabolic):    estradiol -norethindrone  (ACTIVELLA) 1-0.5 MG tablet, Take 1 tablet by mouth daily.   levothyroxine  (SYNTHROID ) 100 MCG tablet, TAKE 1 TABLET DAILY ON AN EMPTY STOMACH WITH WATER ONLY FOR 30 MIN AND NO ANTACIDS, CALCIUM, MAGNESIUM FOR 4 HRS. AVOID BIOTIN.   metFORMIN  (GLUCOPHAGE ) 1000 MG tablet, TAKE 1 TABLET BY MOUTH TWICE  DAILY FOR DIABETES   MOUNJARO 10 MG/0.5ML Pen, Inject 10 mg into the skin once a week.  Current Outpatient Medications (Cardiovascular):    olmesartan -hydrochlorothiazide (BENICAR  HCT) 40-25 MG tablet, TAKE 1 TABLET BY MOUTH  DAILY FOR BLOOD PRESSURE ,  REPLACES LOSARTAN /HCTZ  TABLET   spironolactone  (ALDACTONE ) 50 MG tablet, Take 1 tablet (50  mg total) by mouth daily. Take one tablet daily with a full glass of water   spironolactone  (ALDACTONE ) 50 MG tablet, Take 1 tablet (50 mg total) by mouth daily.  Current Outpatient Medications (Analgesics):    HYDROcodone -acetaminophen  (NORCO/VICODIN) 5-325 MG tablet, Take 1 tablet by mouth every 6 (six) hours as needed for severe pain (pain score 7-10).   ibuprofen  (ADVIL ) 800 MG tablet, Take  1/2 to 1 tablet  3 x /day with Food ( every 8 hours) as needed                                                         /                                               TAKE                                    BY                                                                            MOUTH   Rimegepant Sulfate (NURTEC) 75 MG TBDP, Take 1 tab under tongue once daily as needed with onset of migraine.  Current Outpatient Medications (Hematological):    folic acid (FOLVITE) 1 MG tablet, Take 1 mg by mouth daily.  Current Outpatient Medications (Other):    azelaic acid  (AZELEX ) 20 % cream, Apply topically 2 (two) times daily. After skin is thoroughly washed and patted dry, gently but thoroughly massage a thin film of  azelaic acid  cream into the affected area twice daily, in the morning and evening.   clindamycin  (CLEOCIN -T) 1 % lotion, Apply topically in the morning. To face   COLLAGEN PO, Take by mouth.   dicyclomine  (BENTYL ) 10 MG capsule, TAKE 1 CAPSULE BY MOUTH THREE TIMES DAILY AS NEEDED FOR ABDOMINAL PAIN OR CRAMPING OR DIARRHEA   famotidine  (PEPCID ) 20 MG tablet, Take 1 tablet (20 mg total) by mouth 2 (two) times daily.   gabapentin  (NEURONTIN ) 300 MG capsule, Take 1 capsule (300 mg total) by mouth 3 (three) times daily.   GARLIC PO, Take 1 tablet by mouth 2 (two) times daily.   lubiprostone  (AMITIZA ) 24 MCG capsule, Take 1 capsule (24 mcg total) by mouth 2 (two) times daily with a meal.   metroNIDAZOLE  (METROGEL ) 0.75 % gel, Apply 1 Application topically 2 (two) times daily.   Multiple Vitamins-Minerals (MULTIPLE VITAMINS/WOMENS PO), Take by mouth.   OVER THE COUNTER MEDICATION, Sea moss   pantoprazole  (PROTONIX ) 40 MG tablet, Take 1 tablet (40 mg total) by mouth daily. TAKE 1 TABLET(40 MG) DAILY BY MOUTH 30-60 MINUTES BEFORE DINNER   temazepam  (RESTORIL ) 15 MG capsule, Take 15 mg by mouth at bedtime as needed.   temazepam  (RESTORIL ) 30 MG capsule, TAKE 1 CAPSULE BY MOUTH  EVERY NIGHT AT BEDTIME AS NEEDED FOR INSOMNIA   tretinoin  (RETIN-A ) 0.05 % cream, Apply topically at bedtime. Apply a pea sized amount 3 nights weekly   triamcinolone  cream (KENALOG ) 0.1 %, Apply 1 Application topically 2 (two) times daily.   valACYclovir  (VALTREX ) 500 MG tablet, TAKE 1 TABLET BY MOUTH  TWICE DAILY AS NEEDED FOR  COLD SORES / FEVER BLISTERS  Medical History:  Past Medical History:  Diagnosis Date   Arthritis    Constipation, chronic    Diverticulitis    Diverticulosis    Fibroid uterus    History of gallstones    Hyperlipidemia    Hypertension    Hypothyroidism    Migraine    PCOS (polycystic ovarian syndrome)    takes metformin  for this   Prediabetes    Thyroid  disease    Allergies: No Known  Allergies   Surgical History:  She  has a past surgical history that includes Breast reduction surgery (1988); Cholecystectomy (1995); Tonsillectomy and adenoidectomy (1990's); LASIK (Left); Refractive surgery (Left); Breast surgery; and Colonoscopy. Family History:  Her family history includes COPD in her mother; Diabetes in her mother and sister; Diverticulitis in her mother; Heart disease in her mother; Leukemia in her mother; Lung cancer in her mother.  REVIEW OF SYSTEMS  : All other systems reviewed and negative except where noted in the History of Present Illness.  PHYSICAL EXAM: There were no vitals taken for this visit. Physical Exam          Alan JONELLE Coombs, PA-C 12:54 PM

## 2024-04-15 ENCOUNTER — Encounter: Payer: Self-pay | Admitting: Physician Assistant

## 2024-04-15 ENCOUNTER — Ambulatory Visit: Admitting: Physician Assistant

## 2024-04-15 VITALS — BP 118/68 | HR 87 | Ht 60.0 in | Wt 165.0 lb

## 2024-04-15 DIAGNOSIS — Z860100 Personal history of colon polyps, unspecified: Secondary | ICD-10-CM

## 2024-04-15 DIAGNOSIS — K219 Gastro-esophageal reflux disease without esophagitis: Secondary | ICD-10-CM

## 2024-04-15 DIAGNOSIS — K581 Irritable bowel syndrome with constipation: Secondary | ICD-10-CM

## 2024-04-15 DIAGNOSIS — Z8719 Personal history of other diseases of the digestive system: Secondary | ICD-10-CM

## 2024-04-15 DIAGNOSIS — K760 Fatty (change of) liver, not elsewhere classified: Secondary | ICD-10-CM | POA: Diagnosis not present

## 2024-04-15 DIAGNOSIS — K582 Mixed irritable bowel syndrome: Secondary | ICD-10-CM

## 2024-04-15 DIAGNOSIS — K5790 Diverticulosis of intestine, part unspecified, without perforation or abscess without bleeding: Secondary | ICD-10-CM

## 2024-04-15 MED ORDER — FLUCONAZOLE 150 MG PO TABS: 150.0000 mg | ORAL_TABLET | Freq: Every day | ORAL | 2 refills | Status: AC

## 2024-04-15 MED ORDER — METRONIDAZOLE 250 MG PO TABS: 250.0000 mg | ORAL_TABLET | Freq: Three times a day (TID) | ORAL | 0 refills | Status: AC

## 2024-04-15 MED ORDER — DICYCLOMINE HCL 10 MG PO CAPS: ORAL_CAPSULE | ORAL | 1 refills | Status: AC

## 2024-04-15 MED ORDER — PRUCALOPRIDE SUCCINATE 2 MG PO TABS: 2.0000 mg | ORAL_TABLET | Freq: Every day | ORAL | 0 refills | Status: AC

## 2024-04-15 MED ORDER — NEOMYCIN SULFATE 500 MG PO TABS: 500.0000 mg | ORAL_TABLET | Freq: Two times a day (BID) | ORAL | 0 refills | Status: AC

## 2024-04-15 NOTE — Patient Instructions (Addendum)
 " _______________________________________________________  If your blood pressure at your visit was 140/90 or greater, please contact your primary care physician to follow up on this.  _______________________________________________________  If you are age 63 or older, your body mass index should be between 23-30. Your Body mass index is 32.22 kg/m. If this is out of the aforementioned range listed, please consider follow up with your Primary Care Provider.  If you are age 63 or younger, your body mass index should be between 19-25. Your Body mass index is 32.22 kg/m. If this is out of the aformentioned range listed, please consider follow up with your Primary Care Provider.   ________________________________________________________  The [facility] providers would like to encourage you to use MYCHART to communicate with providers for non-urgent requests or questions.  Due to long hold times on the telephone, sending your provider a message by [facility] may be a faster and more efficient way to get a response.  Please allow 48 business hours for a response.  Please remember that this is for non-urgent requests.  _______________________________________________________  [facility] is using a team-based approach to care.  Your team is made up of your doctor and two to three APPS. Our APPS (Nurse Practitioners and Physician Assistants) work with your physician to ensure care continuity for you. They are fully qualified to address your health concerns and develop a treatment plan. They communicate directly with your gastroenterologist to care for you. Seeing the Advanced Practice Practitioners on your physician's team can help you by facilitating care more promptly, often allowing for earlier appointments, access to diagnostic testing, procedures, and other specialty referrals.   You have been scheduled for an appointment with Alan Coombs PA-C on 06-24-24 at 3pm . Please arrive 10 minutes  early for your appointment.  We have given you samples of the following medication to take: Ibsrela Lot [id number] A Exp date 08-25-24 30 tablets  VISIT SUMMARY:  You visited us  today due to persistent abdominal pain and bloating. We discussed your history of mixed irritable bowel syndrome (IBS) and suspected gastroparesis, and we have adjusted your medications to help manage your symptoms.  YOUR PLAN:  MIXED IRRITABLE BOWEL SYNDROME (IBS): You have chronic mixed IBS with symptoms including abdominal pain, bloating, and foul-smelling gas. Previous treatments have been ineffective, but Esbrela has provided some relief. -Continue taking Esbrela for upper gastrointestinal symptoms. We have provided you with samples. -We have prescribed Motegrity to help with potential gastroparesis symptoms. -A prescription for dicyclomine  has been sent to [facility] for a 90-day supply. -We have sent prescriptions for Flagyl , neomycin , and Diflucan  to [facility].  GASTROPARESIS: You may have gastroparesis, which is causing bloating, abdominal pain, and delayed gastric emptying. Your symptoms may have been worsened by a recent illness. -We have prescribed Flagyl  (metronidazole ) and neomycin  to treat suspected gastroparesis. -We have also prescribed Diflucan  as a precaution for a potential yeast infection. -Prescriptions for Flagyl , neomycin , and Diflucan  have been sent to [facility].  Can do trial of isbrela for IBS constipation Take Ibsrela (tenapanor) 5 to 10 minutes before eating breakfast and dinner. This can make the medication work better for you compared to taking it on an empty stomach Stop if you have any significant diarrhea/dehydration.   Gastroparesis can happen after infection  What are the signs or symptoms? Symptoms of this condition include: Feeling full after eating very little or a loss of appetite. Nausea, vomiting, or heartburn. Bloating of your abdomen. Inconsistent blood sugar (glucose)  levels on blood tests. Unexplained weight  loss. Acid from the stomach coming up into the esophagus (gastroesophageal reflux). Sudden tightening (spasm) of the stomach, which can be painful. Symptoms may come and go. Some people may not notice any symptoms.  What increases the risk? You are more likely to develop this condition if: You have certain disorders or diseases. These may include: An endocrine disorder. An eating disorder. Amyloidosis. Scleroderma. Parkinson's disease. Multiple sclerosis. Cancer or infection of the stomach or the vagus nerve. You have had surgery on your stomach or vagus nerve. You take certain medicines. You are female.  Things you can do: Please do small frequent meals like 4-6 meals a day.  Eat and drink liquids at separate times.  Avoid high fiber foods, cook your vegetables, avoid high fat food.  Suggest spreading protein throughout the day (greek yogurt, glucerna, soft meat, milk, eggs) Choose soft foods that you can mash with a fork When you are more symptomatic, change to pureed foods foods and liquids.  Consider reading Living well with Gastroparesis by [facility] Check out this link to a diet online https://my.groupjournal.fr  "

## 2024-06-16 ENCOUNTER — Ambulatory Visit: Payer: Self-pay | Admitting: Nurse Practitioner

## 2024-06-24 ENCOUNTER — Ambulatory Visit: Admitting: Physician Assistant
